# Patient Record
Sex: Female | Born: 1940 | Race: White | Hispanic: No | Smoking: Former smoker
Health system: Southern US, Community
[De-identification: ages and names within clinical notes are randomized; demographics above are authoritative.]

## PROBLEM LIST (undated history)

## (undated) DIAGNOSIS — I509 Heart failure, unspecified: Secondary | ICD-10-CM

## (undated) DIAGNOSIS — N189 Chronic kidney disease, unspecified: Secondary | ICD-10-CM

## (undated) DIAGNOSIS — I480 Paroxysmal atrial fibrillation: Secondary | ICD-10-CM

## (undated) DIAGNOSIS — D649 Anemia, unspecified: Secondary | ICD-10-CM

## (undated) DIAGNOSIS — E119 Type 2 diabetes mellitus without complications: Secondary | ICD-10-CM

## (undated) DIAGNOSIS — J449 Chronic obstructive pulmonary disease, unspecified: Secondary | ICD-10-CM

## (undated) DIAGNOSIS — M1990 Unspecified osteoarthritis, unspecified site: Secondary | ICD-10-CM

## (undated) DIAGNOSIS — R0602 Shortness of breath: Secondary | ICD-10-CM

## (undated) DIAGNOSIS — I10 Essential (primary) hypertension: Secondary | ICD-10-CM

## (undated) HISTORY — PX: VEIN SURGERY: SHX48

---

## 1998-02-22 ENCOUNTER — Ambulatory Visit (HOSPITAL_COMMUNITY): Admission: RE | Admit: 1998-02-22 | Discharge: 1998-02-23 | Payer: Self-pay | Admitting: Ophthalmology

## 1998-02-22 ENCOUNTER — Encounter: Payer: Self-pay | Admitting: Ophthalmology

## 1998-09-06 ENCOUNTER — Ambulatory Visit (HOSPITAL_COMMUNITY): Admission: RE | Admit: 1998-09-06 | Discharge: 1998-09-07 | Payer: Self-pay | Admitting: Ophthalmology

## 1999-01-19 ENCOUNTER — Ambulatory Visit (HOSPITAL_COMMUNITY): Admission: RE | Admit: 1999-01-19 | Discharge: 1999-01-20 | Payer: Self-pay | Admitting: Ophthalmology

## 1999-02-28 ENCOUNTER — Encounter: Payer: Self-pay | Admitting: Ophthalmology

## 1999-02-28 ENCOUNTER — Ambulatory Visit (HOSPITAL_COMMUNITY): Admission: RE | Admit: 1999-02-28 | Discharge: 1999-03-01 | Payer: Self-pay | Admitting: Ophthalmology

## 2006-03-21 ENCOUNTER — Ambulatory Visit: Payer: Self-pay | Admitting: Cardiology

## 2006-06-05 ENCOUNTER — Ambulatory Visit: Payer: Self-pay | Admitting: Cardiology

## 2006-06-28 ENCOUNTER — Ambulatory Visit: Payer: Self-pay | Admitting: Cardiology

## 2006-08-15 ENCOUNTER — Ambulatory Visit: Payer: Self-pay | Admitting: Cardiology
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
## 2010-07-05 NOTE — Assessment & Plan Note (Signed)
[facility]                          [facility] OFF[facility] NOTE   NAME:Haley Cooper, Haley Cooper                         MRN:          [id number]  DATE:08/15/2006                            DOB:          1940/06/26    REFERRING PHYSICIAN:  Dhruv Vyas   HISTORY OF PRESENT ILLNESS:  Patient is a 70 year old female with a  rather complicated course.  She has known severe chronic dyspnea.  She  was recently started on oxygen therapy due to marked desaturation on  exertion.  As documented in my previous note, the patient several months  ago had a very abnormal CT scan, but now more recently had near-complete  resolution of the previously noted diffuse bilateral air space process.  She states that she is feeling much better on oxygen therapy.  She does  remain dyspneic, but this has improved.  Of note, she had laboratory  work during the last office visit that demonstrated a significant anemia  with a hemoglobin of 8.7.  Her creatinine had improved to 1.6 where it  was previously at 2.8.  BNP level was normal at 46.  Despite some lower  extremity edema, she only has moderate pulmonary hypertension.   The patient also has a prior history of abnormal Cardiolite study, but a  cardiac catheterization has never been performed due to her previous  significant renal insufficiency.  The patient also denies any substernal  chest pain.   The patient is under a lot of stress now, as her sister is dying with  colon cancer.  The patient resides at [facility].  She states that she  does do much, just sits most of the time in her chair.  She also does  not drive.   MEDICATIONS:  1. Actos 45 mg a day.  2. Lantus __________.  3. Glucotrol 10 mg p.o. b.i.d.  4. Prinivil 40 daily.  5. Paxil 30 mg p.o. daily.  6. Lasix 40 mg p.o. daily.  7. Prilosec.  8. Aspirin 81 mg a day.  9. Neurontin.  10.Xanax.  11.Combivent.  12.Vytorin.  13.Diovan 80 mg p.o. daily.   PHYSICAL  EXAMINATION:  VITAL SIGNS:  Blood pressure 174/72, heart rate  is 86.  Weight is 281 pounds.  NECK:  Normal carotid upstrokes.  No carotid bruits.  LUNGS:  Diminished breath sounds bilaterally.  HEART:  Regular rate and rhythm.  Normal S1 and S2.  ABDOMEN:  Soft, nontender.  No rebound or guarding.  Good bowel sounds.  EXTREMITIES:  Peripheral pitting edema 2+.   PROBLEM LIST:  1. History of dyspnea.      a.     Multifactorial.  2..  Possible underlying coronary artery disease with abnormal  Cardiolite study with lateral defect.  1. Interstitial lung disease with abnormal CT scan, now resolved.  2. Rule out obstructive sleep apnea and obesity hypoventilation      syndrome.  3. Preserved left ventricular function.  4. Mitral regurgitation.  5. Moderate pulmonary hypertension.  6. Severe obesity.  7. Chronic renal insufficiency, creatinine improved from 2.8 to 1.6.  8.  Chronic anemia.  Hemoglobin now 8.7.   PLAN:  1. The patient'Cooper anemia needs to be further evaluated, and I referred      her to Dr. Woody Seller.  I did draw iron saturation and ferritin.  She      will also have guaiac cards sent to rule out GI bleeding.  I have      given her a prescription for iron sulfate 325 mg p.o. t.i.d.  2. I will have the patient come back in the next couple of weeks after      her anemia has been addressed, given her previous abnormal      Cardiolite and ongoing dyspnea, and now that her creatinine has      improved, I feel we can possibly consider her for a cardiac      catheterization.  3. Patient will follow up in 2-3 weeks.  I will make a final decision      regarding her catheterization.     Ernestine Mcmurray, MD,FACC  Electronically Signed    GED/MedQ  DD: 08/15/2006  DT: 08/15/2006  Job #: [id number]   cc:   Jerene Bears

## 2010-07-08 NOTE — Op Note (Signed)
[place]  Patient:    Haley Cooper                          MRN: [id number] Proc. Date: 01/19/99 Adm. Date:  [id number] Attending:  Nolon Bussing                           Operative Report  PREOPERATIVE DIAGNOSES: 1. 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 2. Nonclearing vitreous hemorrhage of the right eye. 3. History of proliferative diabetic retinopathy with progression of disease despite previous panphotocoagulation and previous vitrectomy of the right eye with recurrent vitreous hemorrhages, now clogging the anterior chamber trabecular meshwork.  POSTOPERATIVE DIAGNOSES: 1. 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 2. Nonclearing vitreous hemorrhage of the right eye. 3. History of proliferative diabetic retinopathy with progression of disease despite previous panphotocoagulation and previous vitrectomy of the right eye with recurrent vitreous hemorrhages, now clogging the anterior chamber trabecular meshwork.  PROCEDURES: 1. Posterior vitrectomy with Endolaser panphotocoagulation of the right eye. 2. Insertion of glaucoma Seton Baerveldt style implant in the superotemporal quadrant of the right eye. 3. Tutoplasty coverage of the footplate placed into the pars plana.  SURGEON:  Nolon Bussing, M.D.  ANESTHESIA:  General endotracheal anesthesia.  INDICATIONS:   Patient is a 70 year old woman with sight-threatening glaucoma on the basis of open-angle disease with dense recurrent vitreous hemorrhages clogging the trabecular meshwork.  She was seen yesterday and found to have a pressure in the 50s.  ______ anterior chamber paracentesis have been fashioned to drain the  anterior chamber as well to allow time for planning of appropriate surgical intervention to consist of vitrectomy with glaucoma Seton implantation into the  pars plana.  Patient understood the risks of anesthesia, including the rare  occurrence of death, loss of the eye, including hemorrhage, infection, scarring, need for further surgery, no change in vision, progression of disease despite intervention, loss of vision and even loss of the eye, including as well death from anesthesia.  She gives her consent for anesthesia, as well as, surgical repair.  Appropriate signed consent was obtained.  DESCRIPTION OF PROCEDURE:  Patient taken to the operating room.  In the operating room,  general endotracheal anesthesia was instituted without difficulty. Right periocular region sterilely prepped and draped in usual ophthalmic fashion. Lid speculum applied.  Conjunctival peritomies fashioned temporally and superiorly.  Superotemporal quadrant was then entered with Gerilyn Nestle scissors.  A 4 mm infusion was secured in 3.5 mm posterior to the limbus in the inferotemporal quadrant. Placement in the vitreous cavity verified visually.   Superior sclerotomies then fashioned.  Wild microscope placed in position with Biom attachment.  Core vitrectomy has not been necessary, had been previously done effectively with excellent vitreous skirt trimming.   A slurry of old vitreous hemorrhage was removed without difficulty mobilized from the anterior chamber, as well as, the  posterior chamber.  Endolaser photocoagulation was then carried out in the posterior segment close to the temporal arcades to further enhance previous laser photocoagulation applied.  Scleral depression is used and there found to be no effected areas for further laser photocoagulation was required.  No neovascularization was identified.  No anterior hyaloidal or fibrovascular proliferation was noted.  To prevent this recurrence particularly this type of glaucoma and because of ______ neovascular glaucoma and dense recurrent vitreous hemorrhage, decision was made to place the glaucoma  Seton implant into the pars plana.  A Baerveldt model [id number] was placed in  the superotemporal quadrant, secured with two 9-0 nylon  sutures on the footplate after insertion of a Seton into the pars plana 3.5 mm posterior to the limbus, ______ temporal quadrant.  The implant itself was also  secured to the sclera appropriate distance posteriorly and under the wings of the lateral rectus and superior rectus muscles.  At this time, tutoplast pericardium was then used overlying the footplate and secured with interrupted 9-0 nylon sutures to effectively cover the footplate.  Thereafter, the conjunctiva and Tenons were then brought forward after the superior sclerotomies had been closed with 7-0 Vicryl suture.  The infusion removed and similarly closed with 7-0 Vicryl suture.  Conjunctiva closed with combined interrupted to running fashion 7-0 Vicryl suture to provide a watertight seal.  Anterior chamber paracentesis was fashioned with a MVR blade.  BSS was irrigated across the anterior chamber and free flow into subconjunctival space ______ was  noted through the implant.  Patient tolerated the procedure well without complication after awakened from anesthesia.  A subconjunctival injection of ______ had been applied.  Sterile patch, Fox shield then gently applied on right eye.  Patient awakened from anesthesia and taken to recovery room in good stable condition after tolerating the procedure without complication. DD:  01/19/99 TD:  01/20/99 Job: [id number]

## 2010-07-08 NOTE — Assessment & Plan Note (Signed)
[facility]                          [facility] OFFICE NOTE   NAME:Haley Cooper, Haley Cooper                         MRN:          [id number]  DATE:06/05/2006                            DOB:          1940-08-05    PRIMARY CARDIOLOGIST:  Dr. Dannielle Burn.   REASON FOR VISIT:  Post hospital followup.   Haley Cooper is a 70 year old female, with no prior cardiac history, who  was referred to [place] in late January here at [facility] for  evaluation for multi factorial dyspnea and abnormal cardiac markers. She  was felt to have bilateral pneumonia, superimposed on COPD exacerbation,  and required hospitalization for 1 week. Her cardiac workup consisted of  a 2D echo which showed continued preserved left ventricular function,  which had been noted in 2005, as well as moderate pulmonary hypertension  (previously felt to be moderately severe). There was also mild mitral  regurgitation and mild diastolic dysfunction.   Patient did bump her cardiac markers with the peak troponin of 1.74. It  was felt that this was most likely secondary to supply/demand mismatch  and patient had multiple cor-morbidities, including chronic anemia with  a hemoglobin of 8.3, and acute on chronic renal insufficiency (peak  creatinine 2.8).   Following stabilization of her clinical status, she was cleared to  proceed with an in-house adenosine Cardiolite which demonstrated a large  lateral/posterolateral defect with partial reversibility; ejection  fraction 51%.   Patient reports some improvement in breathing since her hospitalization,  but continues to have exertional dyspnea, which she had prior to her  hospitalization. As stated before, she continues to deny any development  of exertional chest pain. Moreover, she also denies any orthopnea, or  PND, but does have worsening lower extremity edema which is chronic. Her  weight today is 7 pounds greater than when seen in the  hospital.   Electrocardiogram today reveals normal sinus rhythm at 87 BPM with  normal axis and no ischemic changes.   CURRENT MEDICATIONS:  1. Lasix 40 daily.  2. K-Dur 10 q.o.d.  3. Aspirin 81 daily.  4. Actos 45 daily.  5. Lantus 45 units b.i.d.  6. Glucotrol 10 daily.  7. Prinivil 40 daily.  8. Paxil 30 daily.  9. Prilosec OTC.  10.Neurontin 300 t.i.d.  11.Xanax 0.5 b.i.d.  12.Combivent 2 puffs b.i.d.   PHYSICAL EXAMINATION:  Blood pressure 160/74, pulse 88, regular, weight  284.6.  GENERAL: 70 year old female, obese, sitting upright in no distress.  HEENT: Normocephalic, atraumatic.  NECK: Palpable carotid pulses without bruits.  LUNGS: Markedly diminished breath sounds with faint crackles in the  bases, but no wheezes.  HEART: Markedly diminished heart sounds with no significant murmur.  ABDOMEN: Markedly protuberant, benign.  EXTREMITIES: 3/4 + bilateral pitting edema to the knee.  NEURO: No focal deficit.   IMPRESSION:  1. Volume overload.  2. Persistent exertional dyspnea.      a.     Suspect multi factorial etiology, including possible       significant coronary artery disease.      b.  Recent abnormal adenosine Cardiolite suggestive of lateral       ischemia.  3. Persevered left ventricular function.      a.     Mild mitral regurgitation.  4. Pulmonary hypertension.  5. Morbid obesity.  6. Chronic renal insufficiency.  7. Chronic anemia.   PLAN:  1. Repeat chest CT scan (without contrast) for reevaluation and      exclusion of persistent pneumonia and/or pulmonary edema.  2. Increase diuretic regimen with Lasix 40 b.i.d.  Increase K-Dur to      20 daily.  3. Add a BMP level to lab work drawn earlier today, and check a BMET      in 1 week for close monitoring of electrolytes and renal function.  4. Follow up CBC.  5. A 6 minute walk/ room air ABG.  6. Return clinic follow up with myself and Dr. Dannielle Burn in 2 weeks.      Patient will need stabilization  of her volume status, as well as      review of complete blood work, before clearing her to proceed with      diagnostic coronary angiography. At this point in time, however, it      is not      entirely clear that her exertional dyspnea is strictly related to      an anginal equivalent, given her noted cor-morbidities and      documented pulmonary disease.      Gene Serpe, PA-C  Electronically Signed      Ernestine Mcmurray, MD,FACC  Electronically Signed   GS/MedQ  DD: 06/05/2006  DT: 06/05/2006  Job #: [id number]

## 2010-07-08 NOTE — Op Note (Signed)
[place]  Patient:    Haley Cooper                          MRN: [id number] Proc. Date: 02/28/99 Adm. Date:  [id number] Attending:  Nolon Bussing                           Operative Report  PREOPERATIVE DIAGNOSIS:  Dense vitreous hemorrhage, left eye.  Progressive proliferative diabetic retinopathy, left eye.  PROCEDURE:  Posterior vitrectomy with endolaser panphotocoagulation, left eye.  SURGEON:  Nolon Bussing, M.D.  ANESTHESIA:  Local retrobulbar, monitored anesthesia control, left eye.  INDICATIONS:  The patient is a 70 year old woman with dense vitreous nonclearing vitreous hemorrhage of the left eye.  This is an attempt to induce quiescence of proliferative diabetic retinopathy as well as to remove the current vitreous hemorrhage of the left eye.  Retrolintal hemorrhage is noted.  The patient understands the risks of anesthesia including rare occurrence of death, as well as to the eye including hemorrhage, infection, scarring, need for further surgery, no change in vision, loss of vision, and progression of disease despite intervention.  Appropriate signed consent was obtained.  DESCRIPTION OF PROCEDURE:  The patient was taken to the operating room.  In the  operating room, appropriate monitoring followed by mild sedation.  0.75% Marcaine delivered 5 cc retrobulbar followed by an additional 5 cc laterally in fashion modified Kirk Ruths.  The left periocular region was sterilely prepped and draped in the usual ophthalmic fashion.  The lid speculum applied.  Conjunctiva was then opened temporally and superonasally.  4 mm infusion was secured 3.5 mm posterior to the limbus in inferotemporal quadrant.  Placement in the vitreous cavity identified visually.  Superior sclerotomy was then fashioned.  Wilde microscope placed in position with Biome attachment.  Core vitrectomy was then begun.  Vitreous skirt was then engaged  anteriorly and anterior hyloid was transected and vitreous skirt trimmed 360 degrees using scleral depression techniques.  Vitreous improved, retinal hemorrhage was removed in this fashion.  The retina was nicely attached. Vitreoretinal tusks were noted temporally in the left eye.  Endolaser photocoagulation was then used to treat these areas that could have been the site of recurrent hemorrhages as well as peripherally and then a fill-in pattern. Hemostasis was then spontaneous.  Instruments were removed from the eye.  The superior sclerotomies were then closed.  The infusion was similarly closed. 7-0 Vicryl suture was also used to close the conjunctiva.  Subconjunctival injections of antibiotics and steroids applied.  Sterile patch and Fox shield applied. The patient was awakened from anesthesia and taken to the recovery room in good stable condition after tolerating the procedure without complications. DD:  02/28/99 TD:  02/28/99 Job: [id number]

## 2013-04-20 DIAGNOSIS — N189 Chronic kidney disease, unspecified: Secondary | ICD-10-CM

## 2013-04-20 HISTORY — DX: Chronic kidney disease, unspecified: N18.9

## 2013-04-26 ENCOUNTER — Encounter (HOSPITAL_COMMUNITY): Payer: Self-pay | Admitting: Internal Medicine

## 2013-04-26 ENCOUNTER — Inpatient Hospital Stay (HOSPITAL_COMMUNITY): Payer: Medicare Other

## 2013-04-26 ENCOUNTER — Encounter: Payer: Self-pay | Admitting: Cardiovascular Disease

## 2013-04-26 ENCOUNTER — Inpatient Hospital Stay (HOSPITAL_COMMUNITY)
Admission: AD | Admit: 2013-04-26 | Discharge: 2013-04-30 | DRG: 683 | Disposition: A | Discharge status: Home Health | Payer: Medicare Other | Source: Other Acute Inpatient Hospital | Attending: Internal Medicine | Admitting: Internal Medicine

## 2013-04-26 DIAGNOSIS — I498 Other specified cardiac arrhythmias: Secondary | ICD-10-CM | POA: Diagnosis present

## 2013-04-26 DIAGNOSIS — D649 Anemia, unspecified: Secondary | ICD-10-CM | POA: Diagnosis present

## 2013-04-26 DIAGNOSIS — N186 End stage renal disease: Secondary | ICD-10-CM | POA: Diagnosis present

## 2013-04-26 DIAGNOSIS — I959 Hypotension, unspecified: Secondary | ICD-10-CM

## 2013-04-26 DIAGNOSIS — K219 Gastro-esophageal reflux disease without esophagitis: Secondary | ICD-10-CM | POA: Diagnosis present

## 2013-04-26 DIAGNOSIS — M545 Low back pain, unspecified: Secondary | ICD-10-CM | POA: Diagnosis present

## 2013-04-26 DIAGNOSIS — E662 Morbid (severe) obesity with alveolar hypoventilation: Secondary | ICD-10-CM | POA: Diagnosis present

## 2013-04-26 DIAGNOSIS — I120 Hypertensive chronic kidney disease with stage 5 chronic kidney disease or end stage renal disease: Secondary | ICD-10-CM | POA: Diagnosis present

## 2013-04-26 DIAGNOSIS — Q619 Cystic kidney disease, unspecified: Secondary | ICD-10-CM

## 2013-04-26 DIAGNOSIS — J449 Chronic obstructive pulmonary disease, unspecified: Secondary | ICD-10-CM

## 2013-04-26 DIAGNOSIS — Z87891 Personal history of nicotine dependence: Secondary | ICD-10-CM

## 2013-04-26 DIAGNOSIS — L03119 Cellulitis of unspecified part of limb: Secondary | ICD-10-CM

## 2013-04-26 DIAGNOSIS — L02419 Cutaneous abscess of limb, unspecified: Secondary | ICD-10-CM | POA: Diagnosis present

## 2013-04-26 DIAGNOSIS — N183 Chronic kidney disease, stage 3 unspecified: Secondary | ICD-10-CM | POA: Diagnosis present

## 2013-04-26 DIAGNOSIS — I2510 Atherosclerotic heart disease of native coronary artery without angina pectoris: Secondary | ICD-10-CM | POA: Diagnosis present

## 2013-04-26 DIAGNOSIS — G4733 Obstructive sleep apnea (adult) (pediatric): Secondary | ICD-10-CM

## 2013-04-26 DIAGNOSIS — N179 Acute kidney failure, unspecified: Principal | ICD-10-CM

## 2013-04-26 DIAGNOSIS — E119 Type 2 diabetes mellitus without complications: Secondary | ICD-10-CM

## 2013-04-26 DIAGNOSIS — I872 Venous insufficiency (chronic) (peripheral): Secondary | ICD-10-CM | POA: Diagnosis present

## 2013-04-26 DIAGNOSIS — I509 Heart failure, unspecified: Secondary | ICD-10-CM

## 2013-04-26 DIAGNOSIS — Z9181 History of falling: Secondary | ICD-10-CM

## 2013-04-26 DIAGNOSIS — I5032 Chronic diastolic (congestive) heart failure: Secondary | ICD-10-CM

## 2013-04-26 DIAGNOSIS — N19 Unspecified kidney failure: Secondary | ICD-10-CM

## 2013-04-26 DIAGNOSIS — I4891 Unspecified atrial fibrillation: Secondary | ICD-10-CM

## 2013-04-26 DIAGNOSIS — Z992 Dependence on renal dialysis: Secondary | ICD-10-CM

## 2013-04-26 DIAGNOSIS — I2789 Other specified pulmonary heart diseases: Secondary | ICD-10-CM | POA: Diagnosis present

## 2013-04-26 DIAGNOSIS — Z6841 Body Mass Index (BMI) 40.0 and over, adult: Secondary | ICD-10-CM

## 2013-04-26 DIAGNOSIS — G8929 Other chronic pain: Secondary | ICD-10-CM | POA: Diagnosis present

## 2013-04-26 DIAGNOSIS — E875 Hyperkalemia: Secondary | ICD-10-CM

## 2013-04-26 DIAGNOSIS — E669 Obesity, unspecified: Secondary | ICD-10-CM

## 2013-04-26 DIAGNOSIS — J4489 Other specified chronic obstructive pulmonary disease: Secondary | ICD-10-CM | POA: Diagnosis present

## 2013-04-26 DIAGNOSIS — K439 Ventral hernia without obstruction or gangrene: Secondary | ICD-10-CM | POA: Diagnosis present

## 2013-04-26 HISTORY — DX: Essential (primary) hypertension: I10

## 2013-04-26 HISTORY — DX: Type 2 diabetes mellitus without complications: E11.9

## 2013-04-26 HISTORY — DX: Chronic kidney disease, unspecified: N18.9

## 2013-04-26 HISTORY — DX: Unspecified osteoarthritis, unspecified site: M19.90

## 2013-04-26 HISTORY — DX: Anemia, unspecified: D64.9

## 2013-04-26 HISTORY — DX: Shortness of breath: R06.02

## 2013-04-26 HISTORY — DX: Heart failure, unspecified: I50.9

## 2013-04-26 HISTORY — DX: Chronic obstructive pulmonary disease, unspecified: J44.9

## 2013-04-26 LAB — TYPE AND SCREEN
ABO/RH(D): O NEG
ANTIBODY SCREEN: NEGATIVE

## 2013-04-26 LAB — PROTEIN / CREATININE RATIO, URINE
CREATININE, URINE: 37.5 mg/dL
Protein Creatinine Ratio: 0.18 — ABNORMAL HIGH (ref 0.00–0.15)
Total Protein, Urine: 6.8 mg/dL

## 2013-04-26 LAB — CBC WITH DIFFERENTIAL/PLATELET
Basophils Absolute: 0 10*3/uL (ref 0.0–0.1)
Basophils Relative: 0 % (ref 0–1)
Eosinophils Absolute: 0 10*3/uL (ref 0.0–0.7)
Eosinophils Relative: 1 % (ref 0–5)
HCT: 28.8 % — ABNORMAL LOW (ref 36.0–46.0)
Hemoglobin: 9.3 g/dL — ABNORMAL LOW (ref 12.0–15.0)
Lymphocytes Relative: 8 % — ABNORMAL LOW (ref 12–46)
Lymphs Abs: 0.3 10*3/uL — ABNORMAL LOW (ref 0.7–4.0)
MCH: 31.3 pg (ref 26.0–34.0)
MCHC: 32.3 g/dL (ref 30.0–36.0)
MCV: 97 fL (ref 78.0–100.0)
Monocytes Absolute: 0.1 10*3/uL (ref 0.1–1.0)
Monocytes Relative: 1 % — ABNORMAL LOW (ref 3–12)
Neutro Abs: 4 10*3/uL (ref 1.7–7.7)
Neutrophils Relative %: 91 % — ABNORMAL HIGH (ref 43–77)
Platelets: 234 10*3/uL (ref 150–400)
RBC: 2.97 MIL/uL — ABNORMAL LOW (ref 3.87–5.11)
RDW: 14.7 % (ref 11.5–15.5)
WBC: 4.4 10*3/uL (ref 4.0–10.5)

## 2013-04-26 LAB — PROTIME-INR
INR: 1.03 (ref 0.00–1.49)
Prothrombin Time: 13.3 seconds (ref 11.6–15.2)

## 2013-04-26 LAB — RENAL FUNCTION PANEL
Albumin: 3.5 g/dL (ref 3.5–5.2)
BUN: 84 mg/dL — ABNORMAL HIGH (ref 6–23)
CO2: 19 mEq/L (ref 19–32)
Calcium: 9 mg/dL (ref 8.4–10.5)
Chloride: 100 mEq/L (ref 96–112)
Creatinine, Ser: 3.25 mg/dL — ABNORMAL HIGH (ref 0.50–1.10)
GFR calc Af Amer: 15 mL/min — ABNORMAL LOW (ref >90)
GFR calc non Af Amer: 13 mL/min — ABNORMAL LOW (ref >90)
Glucose, Bld: 138 mg/dL — ABNORMAL HIGH (ref 70–99)
Phosphorus: 4.7 mg/dL — ABNORMAL HIGH (ref 2.3–4.6)
Potassium: 7.6 mEq/L (ref 3.7–5.3)
Sodium: 133 mEq/L — ABNORMAL LOW (ref 137–147)

## 2013-04-26 LAB — APTT: aPTT: 26 seconds (ref 24–37)

## 2013-04-26 LAB — GLUCOSE, CAPILLARY: GLUCOSE-CAPILLARY: 131 mg/dL — AB (ref 70–99)

## 2013-04-26 LAB — TROPONIN I

## 2013-04-26 MED ORDER — AMITRIPTYLINE HCL 10 MG PO TABS
10.0000 mg | ORAL_TABLET | Freq: Every day | ORAL | Status: DC
  Administered 2013-04-26 – 2013-04-29 (×4): 10 mg via ORAL
  Filled 2013-04-26 (×6): qty 1

## 2013-04-26 MED ORDER — INSULIN GLARGINE 100 UNIT/ML ~~LOC~~ SOLN
10.0000 [IU] | Freq: Every day | SUBCUTANEOUS | Status: DC
  Filled 2013-04-26: qty 0.1

## 2013-04-26 MED ORDER — HEPARIN SODIUM (PORCINE) 5000 UNIT/ML IJ SOLN
5000.0000 [IU] | Freq: Three times a day (TID) | INTRAMUSCULAR | Status: DC
  Administered 2013-04-26 – 2013-04-29 (×9): 5000 [IU] via SUBCUTANEOUS
  Filled 2013-04-26 (×11): qty 1

## 2013-04-26 MED ORDER — INSULIN ASPART 100 UNIT/ML ~~LOC~~ SOLN
0.0000 [IU] | Freq: Three times a day (TID) | SUBCUTANEOUS | Status: DC
  Administered 2013-04-26: 1 [IU] via SUBCUTANEOUS
  Administered 2013-04-27 – 2013-04-28 (×3): 2 [IU] via SUBCUTANEOUS
  Administered 2013-04-28: 1 [IU] via SUBCUTANEOUS
  Administered 2013-04-28 – 2013-04-30 (×5): 2 [IU] via SUBCUTANEOUS
  Administered 2013-04-30: 3 [IU] via SUBCUTANEOUS

## 2013-04-26 MED ORDER — GABAPENTIN 300 MG PO CAPS
300.0000 mg | ORAL_CAPSULE | Freq: Three times a day (TID) | ORAL | Status: DC
  Administered 2013-04-26 – 2013-04-30 (×11): 300 mg via ORAL
  Filled 2013-04-26 (×14): qty 1

## 2013-04-26 MED ORDER — PANTOPRAZOLE SODIUM 40 MG PO TBEC
40.0000 mg | DELAYED_RELEASE_TABLET | Freq: Every day | ORAL | Status: DC
  Administered 2013-04-27 – 2013-04-30 (×4): 40 mg via ORAL
  Filled 2013-04-26 (×4): qty 1

## 2013-04-26 MED ORDER — SODIUM POLYSTYRENE SULFONATE 15 GM/60ML PO SUSP
45.0000 g | Freq: Once | ORAL | Status: DC
  Filled 2013-04-26: qty 180

## 2013-04-26 MED ORDER — ACETAMINOPHEN 500 MG PO TABS: 1000.0000 mg | ORAL_TABLET | Freq: Two times a day (BID) | ORAL | Status: DC | PRN

## 2013-04-26 MED ORDER — SIMVASTATIN 40 MG PO TABS
40.0000 mg | ORAL_TABLET | Freq: Every evening | ORAL | Status: DC
  Filled 2013-04-26: qty 1

## 2013-04-26 MED ORDER — CYCLOSPORINE 0.05 % OP EMUL
1.0000 [drp] | Freq: Two times a day (BID) | OPHTHALMIC | Status: DC
  Administered 2013-04-26 – 2013-04-30 (×8): 1 [drp] via OPHTHALMIC
  Filled 2013-04-26 (×10): qty 1

## 2013-04-26 MED ORDER — SILVER SULFADIAZINE 1 % EX CREA
1.0000 "application " | TOPICAL_CREAM | Freq: Every day | CUTANEOUS | Status: DC
  Administered 2013-04-27 – 2013-04-30 (×4): 1 via TOPICAL
  Filled 2013-04-26: qty 85

## 2013-04-26 MED ORDER — OMEPRAZOLE MAGNESIUM 20 MG PO TBEC: 20.0000 mg | DELAYED_RELEASE_TABLET | ORAL | Status: DC

## 2013-04-26 MED ORDER — BENZONATATE 100 MG PO CAPS
200.0000 mg | ORAL_CAPSULE | Freq: Three times a day (TID) | ORAL | Status: DC | PRN
  Filled 2013-04-26: qty 2

## 2013-04-26 NOTE — Consult Note (Addendum)
Haley Cooper 04/26/2013 Pearson Grippe, B Requesting Physician:  Verlon Au  Reason for Consult:  Hyperkalemia, renal failure, EKG changes/bradycardia HPI:  25F presented to [facility] after fall 3d ago at [facility] with weakness found to have wide complex bradycardia by report, K 8.3, BUN 81, creatinine 3.83, Hb 8.2.  Pt with unclear baseline renal function.  At ALF, [facility] lists TMP/SMX DS BID and lisinopril 10 amongst other medications.  Given Cagluc, 40 IV lasix, 10 Units IV insulin / D5, albuterol neg, and 15gm of kayexalate at ED.    ? If TMP/SMX to treat chronic venous stasis with ulcers?  Pt now only c/o is of chronic SOB.  No twitching.  She states taht she often falls out.  No CP.  No abd pain.  No problems passing water.    ROS NSAIDS: meloxicam on MAR, but not recieving IV Contrast none TMP/SMX Yes, BID DS  On MAR since 03/25/13 Hypotension none ACEI: Yes, lisinopril 10 Balance of 12 systems is negative w/ exceptions as above  PMH: COPD, HTN, DM2, CHF, ventral hernia, venous stasis that can be elicited PSH No past surgical history on file. FH no pertinent FH SH past smoker, lives at [place] reports NKDA Home medications Prior to Admission medications   Not on File   [facility] Meds reviewed, includes Insulin Glucotrol Glipizide Lisinopril 10 PPI Fosamax Lasix 40 daily Bactrim DS BID started 03/25/13 neurontin amitryptiline zocor   Current Medications None at [facility]  No labs in [facility] system  Physical Exam  There were no vitals taken for this visit. GEN: NAD, obese, chroncially ill apearing elderly female ENT: edentulous, ncat EYES: EOMI CV: RRR, nl s1s2. No rub PULM: nl WOB, some bibasilar crackles ABD: s/nt/nd.  Large ventral hernia, easily reducible present.  +BS SKIN: no rashes/lesions EXT: chronic venous stasis changes.  2-3+ ledema NEURO: nonfocal. No asterixis  EKG 04/26/13 at 15:43: Rate 40.  Wide complex QRS > 120.  Peaked Ts.  No Ps.     Assessment 25F with renal failure of unclear duration (glucotrol suggests this is AKI) with hyperkalemia (single value 8.3) and wide complex bradycardia with peaked Ts (now in NSR, narrow complex).  Pt on BID DS Bactrim + ACEi  1. Severe Hyperkalemia on TMP/SMX and ACEi 2. Suspected AKI (driven by Bactrim?) 3. Junctional bradycardia with peaked Ts 4. DM2 5. Pt reported CHF 6. Falls 7. COPD by report 69. Anemia  PLAN 1. Hold ACEi and TMP/SMX 2. Stat labs ordered; if persistent hyperkalemia proceed with CVC and iHD tonight 3. [facility] medical mgmt can temporize until meds out of system 4. On Telemetry 5. Renal US 6. UA and UP/C 7. SPEP and sFLC 8. If worsening renal function or obstruction seen, place foley catheter  04/26/13 at 2030: K 7.6.  Given EKG changes prev will procede with 2h HD tonight.  CCM assisting with CVC, appreciate.     Pearson Grippe MD 04/26/2013, 7:48 PM

## 2013-04-26 NOTE — H&P (Signed)
[facility] History and Physical  Haley Cooper [id number] DOB: 06-25-1940 DOA: 04/26/2013  Referring physician: EDP PCP: Glenda Chroman., MD   Chief Complaint: Hyperkalemia   HPI: Haley Cooper is a 73 y.o. female who presented to [facility] hospital after a fall 3 days ago at [facility].  Fall was short duration and she was not down on the floor for a prolonged period of time.  She states she has been becoming progressively weak and ill feeling since that time.  Unclear baseline renal function, she has been on TMP/SMX to treat venous stasis ulcers for the past "2 weeks or so" and is chronically on lisinopril.  Work up in the ED at [facility] showed potassium 8.3, BUN 81, creatinine 3.83, HGB 8.2.  She was transferred to [facility] for further treatment and nephrology consultation.  Review of Systems: Systems reviewed.  As above, otherwise negative  Past Medical History  Diagnosis Date  . DM2 (diabetes mellitus, type 2)   . COPD (chronic obstructive pulmonary disease)   . HTN (hypertension)   . CHF (congestive heart failure)    No past surgical history on file. Social History:  reports that she has quit smoking. She does not have any smokeless tobacco history on file. She reports that she does not drink alcohol or use illicit drugs.  Allergies not on file  No family history on file. No history of ESRD.  Prior to Admission medications   Not on File   Physical Exam: Filed Vitals:   04/26/13 2021  BP: 122/50  Pulse: 80  Temp: 98 F (36.7 C)  Resp: 22    BP 122/50  Pulse 80  Temp(Src) 98 F (36.7 C) (Oral)  Resp 22  SpO2 98%  General Appearance:    Alert, oriented, no distress, Ill appearing appears stated age  Head:    Normocephalic, atraumatic  Eyes:    PERRL, EOMI, sclera non-icteric        Nose:   Nares without drainage or epistaxis. Mucosa, turbinates normal  Throat:   Moist mucous membranes. Oropharynx without erythema or exudate.  Neck:   Supple. No carotid bruits.   No thyromegaly.  No lymphadenopathy.   Back:     No CVA tenderness, no spinal tenderness  Lungs:     Clear to auscultation bilaterally, without wheezes, rhonchi or rales  Chest wall:    No tenderness to palpitation  Heart:    Regular rate and rhythm without murmurs, gallops, rubs  Abdomen:     Soft, non-tender, nondistended, normal bowel sounds, no organomegaly  Genitalia:    deferred  Rectal:    deferred  Extremities:   Chronic venous stasis changes.  Pulses:   2+ and symmetric all extremities  Skin:   Skin color, texture, turgor normal, no rashes or lesions  Lymph nodes:   Cervical, supraclavicular, and axillary nodes normal  Neurologic:   CNII-XII intact. Normal strength, sensation and reflexes      throughout    Labs on Admission:  Basic Metabolic Panel: No results found for this basename: NA, K, CL, CO2, GLUCOSE, BUN, CREATININE, CALCIUM, MG, PHOS,  in the last 168 hours Liver Function Tests: No results found for this basename: AST, ALT, ALKPHOS, BILITOT, PROT, ALBUMIN,  in the last 168 hours No results found for this basename: LIPASE, AMYLASE,  in the last 168 hours No results found for this basename: AMMONIA,  in the last 168 hours CBC: No results found for this basename: WBC, NEUTROABS, HGB, HCT, MCV,  PLT,  in the last 168 hours Cardiac Enzymes: No results found for this basename: CKTOTAL, CKMB, CKMBINDEX, TROPONINI,  in the last 168 hours  BNP (last 3 results) No results found for this basename: PROBNP,  in the last 8760 hours CBG:  Recent Labs Lab 04/26/13 2020  GLUCAP 131*    Radiological Exams on Admission: No results found.  EKG: Independently reviewed. Narrow complex NSR at this time, was wide complex bradycardia and tall peaked T waves back at [facility] before treatment  Assessment/Plan Principal Problem:   AKI (acute kidney injury) Active Problems:   DM2 (diabetes mellitus, type 2)   Hyperkalemia   Chronic CHF   1. Severe hyperkalemia - in setting of  suspected AKI on bactrim and ACEi, severe enough to have been causing EKG changes including junctional bradycardia with peaked T waves at [place].  Holding bactrim and ACEi, strict intake and output, stat labs ordered by nephrology who have already seen patient (see their note in chart) if no improvement in potassium then may require emergent dialysis tonight per their note.  Q4H potassium lab draws ordered (under assumption that there is improvement).  Tele, renal ultrasound, bladder scan ordered for tonight (insert foley if it shows urinary retention).  Nephrology has also ordered work up for [place]. 2. Chronic CHF - just got 40 of lasix at [facility], will review home meds once list is updated. 3. DM2 - have put her on a low dose SSI to start with given that she was hypoglycemic at [place] and required D5, will need to adjust based on home meds and how her BGL responds.    Code Status: Full  Family Communication: No family in room Disposition Plan: Admit to SDU   Time spent: 70 min  GARDNER, JARED M. [facility] Pager [phone]  If 7AM-7PM, please contact the day team taking care of the patient [email or web address] Password [facility] 04/26/2013, 8:57 PM

## 2013-04-26 NOTE — Procedures (Addendum)
Central Venous Catheter Insertion Procedure Note Haley Cooper ON:[id number] 1940-10-22  Procedure: Insertion of Central Venous Catheter Indications: HD  Procedure Details Consent: Risks of procedure as well as the alternatives and risks of each were explained to the (patient/caregiver).  Consent for procedure obtained. Time Out: Verified patient identification, verified procedure, site/side was marked, verified correct patient position, special equipment/implants available, medications/allergies/relevent history reviewed, required imaging and test results available.  Performed  Maximum sterile technique was used including antiseptics, cap, gloves, gown, hand hygiene, mask and sheet. Skin prep: Chlorhexidine; local anesthetic administered A antimicrobial bonded/coated double lumen HD (Trialysis) catheter was placed in the right internal jugular vein using the Seldinger technique.  Evaluation Blood flow good Complications: No apparent complications Patient did tolerate procedure well. Chest X-ray ordered to verify placement.  CXR: pending.  Gidget Quizhpi R. 04/26/2013, 11:22 PM  I used ultrasound to locate and access the vein/artery.

## 2013-04-26 NOTE — Progress Notes (Addendum)
Labs reviewed, K 7.6, I see that Dr. Joelyn Oms has made decision regarding dialysis tonight, agree with Dr. Joelyn Oms especially in light of previous unstable EKG changes.  PCCM putting access in patient and patient going to HD tonight.  Med rec completed, putting patient on Lantus 10 instead of 22 in light of new renal failure, low dose SSI continued.

## 2013-04-27 ENCOUNTER — Other Ambulatory Visit: Payer: Self-pay

## 2013-04-27 ENCOUNTER — Inpatient Hospital Stay (HOSPITAL_COMMUNITY): Payer: Medicare Other

## 2013-04-27 DIAGNOSIS — I4891 Unspecified atrial fibrillation: Secondary | ICD-10-CM | POA: Diagnosis present

## 2013-04-27 DIAGNOSIS — I959 Hypotension, unspecified: Secondary | ICD-10-CM | POA: Diagnosis present

## 2013-04-27 DIAGNOSIS — N19 Unspecified kidney failure: Secondary | ICD-10-CM | POA: Insufficient documentation

## 2013-04-27 LAB — CBC
HCT: 26.6 % — ABNORMAL LOW (ref 36.0–46.0)
HEMOGLOBIN: 8.7 g/dL — AB (ref 12.0–15.0)
MCH: 31.2 pg (ref 26.0–34.0)
MCHC: 32.7 g/dL (ref 30.0–36.0)
MCV: 95.3 fL (ref 78.0–100.0)
Platelets: 249 10*3/uL (ref 150–400)
RBC: 2.79 MIL/uL — ABNORMAL LOW (ref 3.87–5.11)
RDW: 14.5 % (ref 11.5–15.5)
WBC: 5.1 10*3/uL (ref 4.0–10.5)

## 2013-04-27 LAB — POTASSIUM
POTASSIUM: 5 meq/L (ref 3.7–5.3)
POTASSIUM: 5.3 meq/L (ref 3.7–5.3)
Potassium: 7.7 mEq/L (ref 3.7–5.3)
Potassium: 5.2 mEq/L (ref 3.7–5.3)
Potassium: 4.8 mEq/L (ref 3.7–5.3)
Potassium: 5.4 mEq/L — ABNORMAL HIGH (ref 3.7–5.3)
Potassium: 5.3 mEq/L (ref 3.7–5.3)

## 2013-04-27 LAB — MRSA PCR SCREENING: MRSA by PCR: NEGATIVE

## 2013-04-27 LAB — GLUCOSE, CAPILLARY
GLUCOSE-CAPILLARY: 162 mg/dL — AB (ref 70–99)
GLUCOSE-CAPILLARY: 198 mg/dL — AB (ref 70–99)
Glucose-Capillary: 104 mg/dL — ABNORMAL HIGH (ref 70–99)
Glucose-Capillary: 119 mg/dL — ABNORMAL HIGH (ref 70–99)
Glucose-Capillary: 142 mg/dL — ABNORMAL HIGH (ref 70–99)
Glucose-Capillary: 153 mg/dL — ABNORMAL HIGH (ref 70–99)
Glucose-Capillary: 167 mg/dL — ABNORMAL HIGH (ref 70–99)

## 2013-04-27 LAB — RENAL FUNCTION PANEL
Albumin: 3 g/dL — ABNORMAL LOW (ref 3.5–5.2)
BUN: 39 mg/dL — AB (ref 6–23)
CALCIUM: 8.1 mg/dL — AB (ref 8.4–10.5)
CO2: 28 mEq/L (ref 19–32)
CREATININE: 2.02 mg/dL — AB (ref 0.50–1.10)
Chloride: 98 mEq/L (ref 96–112)
GFR calc Af Amer: 27 mL/min — ABNORMAL LOW (ref >90)
GFR calc non Af Amer: 23 mL/min — ABNORMAL LOW (ref >90)
GLUCOSE: 166 mg/dL — AB (ref 70–99)
PHOSPHORUS: 3.1 mg/dL (ref 2.3–4.6)
Potassium: 5.1 mEq/L (ref 3.7–5.3)
SODIUM: 136 meq/L — AB (ref 137–147)

## 2013-04-27 LAB — ABO/RH: ABO/RH(D): O NEG

## 2013-04-27 LAB — HEPATITIS B SURFACE ANTIGEN: HEP B S AG: NEGATIVE

## 2013-04-27 LAB — BASIC METABOLIC PANEL
BUN: 34 mg/dL — AB (ref 6–23)
CALCIUM: 8.3 mg/dL — AB (ref 8.4–10.5)
CO2: 26 meq/L (ref 19–32)
CREATININE: 1.68 mg/dL — AB (ref 0.50–1.10)
Chloride: 99 mEq/L (ref 96–112)
GFR calc Af Amer: 34 mL/min — ABNORMAL LOW (ref >90)
GFR calc non Af Amer: 29 mL/min — ABNORMAL LOW (ref >90)
GLUCOSE: 116 mg/dL — AB (ref 70–99)
Potassium: 4.9 mEq/L (ref 3.7–5.3)
Sodium: 138 mEq/L (ref 137–147)

## 2013-04-27 LAB — HEPATITIS B CORE ANTIBODY, TOTAL: HEP B C TOTAL AB: NONREACTIVE

## 2013-04-27 LAB — TROPONIN I

## 2013-04-27 LAB — HEPATITIS B SURFACE ANTIBODY,QUALITATIVE: HEP B S AB: NEGATIVE

## 2013-04-27 MED ORDER — NEPRO/CARBSTEADY PO LIQD
237.0000 mL | ORAL | Status: DC | PRN
  Filled 2013-04-27: qty 237

## 2013-04-27 MED ORDER — WARFARIN - PHYSICIAN DOSING INPATIENT
Freq: Every day | Status: DC
  Administered 2013-04-27: 17:00:00

## 2013-04-27 MED ORDER — DIGOXIN 0.25 MG/ML IJ SOLN
0.2500 mg | Freq: Once | INTRAMUSCULAR | Status: DC
  Filled 2013-04-27: qty 1

## 2013-04-27 MED ORDER — HEPARIN SODIUM (PORCINE) 1000 UNIT/ML DIALYSIS
1000.0000 [IU] | INTRAMUSCULAR | Status: DC | PRN
Start: 2013-04-27 — End: 2013-04-29
  Administered 2013-04-27: 2400 [IU] via INTRAVENOUS_CENTRAL

## 2013-04-27 MED ORDER — PENTAFLUOROPROP-TETRAFLUOROETH EX AERO
1.0000 | INHALATION_SPRAY | CUTANEOUS | Status: DC | PRN
Start: 2013-04-27 — End: 2013-04-29

## 2013-04-27 MED ORDER — DIGOXIN 0.25 MG/ML IJ SOLN
0.1250 mg | Freq: Every day | INTRAMUSCULAR | Status: DC
  Filled 2013-04-27: qty 0.5

## 2013-04-27 MED ORDER — DIGOXIN 0.25 MG/ML IJ SOLN
0.1250 mg | Freq: Once | INTRAMUSCULAR | Status: AC
  Administered 2013-04-27: 08:00:00 via INTRAVENOUS
  Filled 2013-04-27: qty 0.5

## 2013-04-27 MED ORDER — SODIUM CHLORIDE 0.9 % IV SOLN
100.0000 mL | INTRAVENOUS | Status: DC | PRN
Start: 2013-04-27 — End: 2013-04-29

## 2013-04-27 MED ORDER — LIDOCAINE HCL (PF) 1 % IJ SOLN: 5.0000 mL | INTRAMUSCULAR | Status: DC | PRN

## 2013-04-27 MED ORDER — WARFARIN SODIUM 2 MG PO TABS
2.0000 mg | ORAL_TABLET | Freq: Every day | ORAL | Status: AC
  Administered 2013-04-27: 2 mg via ORAL
  Filled 2013-04-27: qty 1

## 2013-04-27 MED ORDER — SODIUM CHLORIDE 0.9 % IV SOLN: 100.0000 mL | INTRAVENOUS | Status: DC | PRN

## 2013-04-27 MED ORDER — LIDOCAINE-PRILOCAINE 2.5-2.5 % EX CREA
1.0000 | TOPICAL_CREAM | CUTANEOUS | Status: DC | PRN
Start: 2013-04-27 — End: 2013-04-29
  Filled 2013-04-27: qty 5

## 2013-04-27 MED ORDER — DIGOXIN 0.25 MG/ML IJ SOLN
0.2500 mg | Freq: Once | INTRAMUSCULAR | Status: AC
  Administered 2013-04-27: 0.25 mg via INTRAVENOUS
  Filled 2013-04-27: qty 1

## 2013-04-27 MED ORDER — ALTEPLASE 2 MG IJ SOLR
2.0000 mg | Freq: Once | INTRAMUSCULAR | Status: AC | PRN
  Filled 2013-04-27: qty 2

## 2013-04-27 NOTE — Progress Notes (Signed)
Pt getting hemodialysis Tx for high K level, started having frequent runs of SVT with rate as high as 150 followed by rhythm slowing and having frequent multifocal PVC's and coupling and 3beat runs of V-tach. Dr. Joelyn Oms called and informed of this. Repeat k was sent down STAT. Pt to continue hemodialysis unless wide complex ectopy gets more frequent. Pts BP has decreased but is maintining-107/53.

## 2013-04-27 NOTE — Progress Notes (Signed)
Admit: 04/26/2013 LOS: 1  28F presented to outside ED with renal failure of unclear duration (glucotrol suggests this is AKI) with hyperkalemia (K 8.3) and wide complex junctional rhythm with peaked T-waves. Pt on BID DS Bactrim + ACEi  Subjective:  K did not further improve, rec HD yesterday with post K 4.8. Has developed SVT/AFib with RVR during post/treatment; BP s table Pt w/o CP or SOB Loaded on dig UP/C 0.18  03/07 0701 - 03/08 0700 In: 240 [P.O.:240] Out: 1700 [Urine:1700]  Filed Weights   04/26/13 2021 04/27/13 0030  Weight: 109.5 kg (241 lb 6.5 oz) 109.5 kg (241 lb 6.5 oz)    Current meds: reviewed  Current Labs: reviewed    Physical Exam:  Blood pressure 107/59, pulse 149, temperature 97.5 F (36.4 C), temperature source Oral, resp. rate 16, height 5\' 1"  (1.549 m), weight 109.5 kg (241 lb 6.5 oz), SpO2 96.00%. GEN: NAD, obese, chroncially ill apearing elderly female  ENT: edentulous, ncat; NECK: R IJ HD Cath noted EYES: EOMI  CV: RRR, nl s1s2. No rub  PULM: nl WOB, some bibasilar crackles  ABD: s/nt/nd. Large ventral hernia, easily reducible present. +BS  SKIN: no rashes/lesions  EXT: chronic venous stasis changes. 2-3+ ledema  NEURO: nonfocal. No asterixis  Assessment 1. Severe Hyperkalemia with EKG changes s/p HD 04/27/13, resolved driven by TMP/SMX and ACEi, AKI 2. Suspected AKI over CKD 3. AFib with RVR / SVT 4. DM2 5. CHF 6. COPD 7. Anemia  Plan 1. Off ACEi and TMP/SMX 2. Follow BID Renal panel 3. No further HD at this time, follow to asses current GFR 4. Strict I/Os 5. AFib per TRH.  6. Renal US pending 7. SPEP and sFLC pending  Pearson Grippe MD 04/27/2013, 7:49 AM   Recent Labs Lab 04/26/13 2045 04/27/13 0045 04/27/13 0321 04/27/13 0554  NA 133*  --   --   --   K 7.6* 7.7* 5.2 4.8  CL 100  --   --   --   CO2 19  --   --   --   GLUCOSE 138*  --   --   --   BUN 84*  --   --   --   CREATININE 3.25*  --   --   --   CALCIUM 9.0  --   --    --   PHOS 4.7*  --   --   --     Recent Labs Lab 04/26/13 2045 04/27/13 0356  WBC 4.4 5.1  NEUTROABS 4.0  --   HGB 9.3* 8.7*  HCT 28.8* 26.6*  MCV 97.0 95.3  PLT 234 249

## 2013-04-27 NOTE — Consult Note (Signed)
  Reason for Consult:Atrial fibrillation with a RVR  Referring Physician: Dr. Darliss Ridgel Haley Cooper is an 73 y.o. female.   HPI: The patient is a 73 yo woman with a h/o chronic renal insufficiency who presented with syncope in the setting of hyperkalemia and renal failure. She has undergone emergent HD and was found to be in atrial fibrillation with a RVR. She has now reverted back to NSR. She has chronic peripheral edema, morbid obesity, and COPD. She has HTN and DM. She is quite sedentary. She is noted to have falls in the past. She denies a h/o palpitations and apparently did not know that she was in atrial fibrillation.   PMH: Past Medical History  Diagnosis Date  . DM2 (diabetes mellitus, type 2)   . COPD (chronic obstructive pulmonary disease)   . HTN (hypertension)   . CHF (congestive heart failure)     PSHX:No past surgical history on file.  FAMHX:No family history on file.  Social History:  reports that she has quit smoking. She does not have any smokeless tobacco history on file. She reports that she does not drink alcohol or use illicit drugs.  Allergies: No Known Allergies  Medications: reviewed  Dg Chest Port 1 View  04/27/2013   CLINICAL DATA:  HD catheter placement verification.  EXAM: PORTABLE CHEST - 1 VIEW  COMPARISON:  None.  FINDINGS: The heart is enlarged. There is moderate vascular congestion. Hemodialysis catheter has been placed from a right supraclavicular/ internal jugular approach. The tips appear to be at or just below the SVC/RA junction. There is no visible pneumothorax.  IMPRESSION: HD catheter tips project over the region of the cavoatrial junction. There is cardiomegaly with moderate vascular congestion.   Electronically Signed   By: Rolla Flatten M.D.   On: 04/27/2013 00:43    ROS  As stated in the HPI and negative for all other systems. She has chronic dyspnea  Physical Exam  Vitals:Blood pressure 153/65, pulse 79, temperature 97.9 F (36.6 C),  temperature source Oral, resp. rate 16, height 5\' 1"  (1.549 m), weight 241 lb 6.5 oz (109.5 kg), SpO2 93.00%.  Chronically ill appearing obese, diskempt, looks older than her stated age, NAD HEENT: Unremarkable Neck:  No JVD, no thyromegally, indwelling catheter Back:  No CVA tenderness Lungs:  Scattered rales and rhonchi, no increased work of breathing HEART:  Regular rate rhythm, no murmurs, no rubs, no clicks Abd:  obese, positive bowel sounds, no organomegally, no rebound, no guarding Ext:  2 plus pulses, no edema, no cyanosis, no clubbing Skin:  No rashes no nodules Neuro:  CN II through XII intact, motor grossly intact  Tele - atrial fib with an RVR, now NSR CXR - reviewed ECG - reviewed  Assessment/Plan: 1. New onset atrial fib 2. ESRD on HD 3. HTN 4. DM 5. Chronic diastolic heart failure 6. COPD Rec: A very difficult situation. The patient is not a good anti-coagulation candidate and has no real good anti-arrhythmic drug options. Her obesity and copd make amio a relatively poor choice. I am concerned about systemic anti-coagulation in this patient but know that her stroke risk is also very high. I would recommend a trial of coumadin. Not a candidate for any of the novel agents.    Carleene Overlie TaylorMD 04/27/2013, 3:06 PM

## 2013-04-27 NOTE — Progress Notes (Signed)
Dr. Joelyn Oms called results of K of 5.2. Pt had run 40 additional minutes on hemodialysis before the lab results were back. Pt continued to be in SVT with rate from 140-170. BP was starting to drop. Hemodialysis Tx was stopped. Runs of SVT remained frequent after dialysis but did slow down some

## 2013-04-27 NOTE — Progress Notes (Signed)
Note: This document was prepared with digital dictation and possible smart phrase technology. Any transcriptional errors that result from this process are unintentional.   Haley Cooper:[id number] DOB: November 12, 1940 DOA: 04/26/2013 PCP: Glenda Chroman., MD  Brief narrative: 54 yr ? known multifactorial dyspnea, possible underlying CAD with prior abn Cardiolyte [see notes from [facility]], ?OHs/OSA [reports used to be on CPAP ], prior smoker for 27 years +MOd Pulm Art Htn, h/o Chronic renal insuff, Anemia and assisted living facility resident admitted 04/26/13 from outside hospital, [facility] ED with acute renal insufficiency with hyperkalemia-in a setting of 3 week use Bactrim for lower extremity cellulitis prescribed by her podiatrist-also on lisinopril, Lasix. She states that she had multiple falls over the past week, one when she was at the supermarket and another fall that was unprovoked a day to day and a half ago and was urged by her assisted living staff to get medical attention. She denies any dizziness weakness or presyncopal episodes causing this but does carry a history of chronic low back pain for the past one and half to 2 years which has been nonsurgically managed-she takes meloxicam  Admission labs = BUN 84, creatinine 3.2, potassium 7.6 EGFR 13-empirically treated calcium gluconate, Kayexalate, 40 IV Lasix as well as insulin D5 Hemoglobin 9.3 platelets 234 white count 4.4 Chest x-ray = cardiomegaly with moderate vascular congestion Nephrology, critical care consultant given emergent need for dialysis-right-sided central venous line placed 3/7   Past medical history-As per Problem list Chart reviewed as below- Reviewed  Consultants:  Nephrology  Cardiology  Procedures:  Central venous line right neck  Antibiotics:  None   Subjective  Looks uncomfortable, laying in bed, denies chest pain nausea vomiting shortness of breath GERD vision double vision. States that she has never  had chest pain before or palpitations denies weakness on any one side    Objective    Interim History: Junctional rhythm changed to atrial fibrillation, rate 95 to 130  Telemetry: See above    Objective: Filed Vitals:   04/27/13 0400 04/27/13 0407 04/27/13 0415 04/27/13 0500  BP: 92/63  95/60 107/59  Pulse: 149 164 155 149  Temp:      TempSrc:  Oral    Resp: '10 12 11 16  ' Height:      Weight:      SpO2: 98% 97% 97% 96%    Intake/Output Summary (Last 24 hours) at 04/27/13 0720 Last data filed at 04/27/13 0407  Gross per 24 hour  Intake    240 ml  Output   1700 ml  Net  -1460 ml    Exam:  General: Pleasant, morbidly obese, no icterus, no pallor, thick neck, Mallampati 4,  Cardiovascular: S1-S2 tachycardic  Respiratory: Clinically clear no added sound  Abdomen: Soft nontender nondistended  Skin   Neurogrossly intact moving all 4 limbs equally  Data Reviewed: Basic Metabolic Panel:  Recent Labs Lab 04/26/13 2045  04/27/13 0321 04/27/13 0554  NA 133*  --   --   --   K 7.6*  < > 5.2 4.8  CL 100  --   --   --   CO2 19  --   --   --   GLUCOSE 138*  --   --   --   BUN 84*  --   --   --   CREATININE 3.25*  --   --   --   CALCIUM 9.0  --   --   --  PHOS 4.7*  --   --   --   < > = values in this interval not displayed. Liver Function Tests:  Recent Labs Lab 04/26/13 2045  ALBUMIN 3.5   No results found for this basename: LIPASE, AMYLASE,  in the last 168 hours No results found for this basename: AMMONIA,  in the last 168 hours CBC:  Recent Labs Lab 04/26/13 2045 04/27/13 0356  WBC 4.4 5.1  NEUTROABS 4.0  --   HGB 9.3* 8.7*  HCT 28.8* 26.6*  MCV 97.0 95.3  PLT 234 249   Cardiac Enzymes:  Recent Labs Lab 04/26/13 2055 04/27/13 0319  TROPONINI <0.30 <0.30   BNP: No components found with this basename: POCBNP,  CBG:  Recent Labs Lab 04/26/13 2020 04/27/13 04/27/13 0431  GLUCAP 131* 167* 119*    Recent Results (from the past 240  hour(s))  MRSA PCR SCREENING     Status: None   Collection Time    04/26/13  9:54 PM      Result Value Ref Range Status   MRSA by PCR NEGATIVE  NEGATIVE Final   Comment:            The GeneXpert MRSA Assay (FDA     approved for NASAL specimens     only), is one component of a     comprehensive MRSA colonization     surveillance program. It is not     intended to diagnose MRSA     infection nor to guide or     monitor treatment for     MRSA infections.     Studies:              All Imaging reviewed and is as per above notation   Scheduled Meds: . amitriptyline  10 mg Oral QHS  . cycloSPORINE  1 drop Both Eyes BID  . [START ON 04/28/2013] digoxin  0.125 mg Intravenous Daily  . digoxin  0.25 mg Intravenous Once  . gabapentin  300 mg Oral TID  . heparin  5,000 Units Subcutaneous 3 times per day  . insulin aspart  0-9 Units Subcutaneous TID WC  . insulin glargine  10 Units Subcutaneous Daily  . pantoprazole  40 mg Oral Daily  . silver sulfADIAZINE  1 application Topical Daily  . simvastatin  40 mg Oral QPM   Continuous Infusions:    Assessment/Plan: 1. Acute kidney injury with uremia, hyperkalemia necessitating emergent dialysis-precipitants include ACE inhibitor, Bactrim use, silver sulfadiazine-defer to nephrology further plans of care. She is putting out good urine and hopefully ultrafiltration has helped and she may not need further dialysis 2. New onset atrial fibrillation, CHad2Vasc2 score=4-necessitates anticoagulation eventually. Loaded wh digoxin 0.25 IV at 6:30 this morning. Repeated 0.125 dose at 8 AM-caution as this is probably renally cleared but no other option at this time and defer decision-making about amiodarone 2 Cardiology,Consuted to help assist with further management. Hopefully can rate control and may convert on her own. Cannot use rate controlling agent such as CCB, beta blocker secondary to moderate hypotension-since this is new onset, potentially can  cardiovert? 3. Chronic diastolic heart failure-update echocardiogram STAT 4. 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  Will consider addition hydralazine, nitrate.  Eventually will need pulmonary input-can trial CPAP at night if patient amenable  5. Lower extremity cellulitis-discontinue all antibiotics at present time 6. Diabetes mellitus type 2-continue sliding scale insulin only for now.  Hold off on gabapentin given uremic symptoms and hyperkalemia  7. Morbid obesity, Body mass index is 45.64 kg/(m^2). 8.  low back pain-outpatient followup. Cox 2 inhibitor held 9. Reflux-pantoprazole 40 daily   Code Status: Full  Family Communication: None at bedside  Disposition Plan: Step down    Verneita Griffes, MD  [facility] Pager [phone] 04/27/2013, 7:20 AM    LOS: 1 day

## 2013-04-28 ENCOUNTER — Inpatient Hospital Stay (HOSPITAL_COMMUNITY): Payer: Medicare Other

## 2013-04-28 DIAGNOSIS — E669 Obesity, unspecified: Secondary | ICD-10-CM | POA: Diagnosis present

## 2013-04-28 DIAGNOSIS — I369 Nonrheumatic tricuspid valve disorder, unspecified: Secondary | ICD-10-CM

## 2013-04-28 DIAGNOSIS — N183 Chronic kidney disease, stage 3 unspecified: Secondary | ICD-10-CM | POA: Diagnosis present

## 2013-04-28 DIAGNOSIS — I5032 Chronic diastolic (congestive) heart failure: Secondary | ICD-10-CM

## 2013-04-28 DIAGNOSIS — G4733 Obstructive sleep apnea (adult) (pediatric): Secondary | ICD-10-CM | POA: Diagnosis present

## 2013-04-28 DIAGNOSIS — J449 Chronic obstructive pulmonary disease, unspecified: Secondary | ICD-10-CM | POA: Diagnosis present

## 2013-04-28 LAB — HEMOGLOBIN A1C
Hgb A1c MFr Bld: 6.1 % — ABNORMAL HIGH (ref <5.7)
Mean Plasma Glucose: 128 mg/dL — ABNORMAL HIGH (ref <117)

## 2013-04-28 LAB — PROTIME-INR
INR: 1.01 (ref 0.00–1.49)
PROTHROMBIN TIME: 13.1 s (ref 11.6–15.2)

## 2013-04-28 LAB — RENAL FUNCTION PANEL
ALBUMIN: 2.9 g/dL — AB (ref 3.5–5.2)
BUN: 40 mg/dL — AB (ref 6–23)
CO2: 28 meq/L (ref 19–32)
Calcium: 7.9 mg/dL — ABNORMAL LOW (ref 8.4–10.5)
Chloride: 101 mEq/L (ref 96–112)
Creatinine, Ser: 1.95 mg/dL — ABNORMAL HIGH (ref 0.50–1.10)
GFR calc Af Amer: 28 mL/min — ABNORMAL LOW (ref >90)
GFR calc non Af Amer: 24 mL/min — ABNORMAL LOW (ref >90)
Glucose, Bld: 199 mg/dL — ABNORMAL HIGH (ref 70–99)
POTASSIUM: 5.2 meq/L (ref 3.7–5.3)
Phosphorus: 3.1 mg/dL (ref 2.3–4.6)
Sodium: 136 mEq/L — ABNORMAL LOW (ref 137–147)

## 2013-04-28 LAB — GLUCOSE, CAPILLARY
GLUCOSE-CAPILLARY: 135 mg/dL — AB (ref 70–99)
GLUCOSE-CAPILLARY: 161 mg/dL — AB (ref 70–99)
GLUCOSE-CAPILLARY: 163 mg/dL — AB (ref 70–99)
Glucose-Capillary: 119 mg/dL — ABNORMAL HIGH (ref 70–99)
Glucose-Capillary: 213 mg/dL — ABNORMAL HIGH (ref 70–99)

## 2013-04-28 LAB — CBC
HCT: 25.1 % — ABNORMAL LOW (ref 36.0–46.0)
Hemoglobin: 8 g/dL — ABNORMAL LOW (ref 12.0–15.0)
MCH: 31.1 pg (ref 26.0–34.0)
MCHC: 31.9 g/dL (ref 30.0–36.0)
MCV: 97.7 fL (ref 78.0–100.0)
Platelets: 232 10*3/uL (ref 150–400)
RBC: 2.57 MIL/uL — ABNORMAL LOW (ref 3.87–5.11)
RDW: 14.7 % (ref 11.5–15.5)
WBC: 5.7 10*3/uL (ref 4.0–10.5)

## 2013-04-28 LAB — KAPPA/LAMBDA LIGHT CHAINS
Kappa free light chain: 7.49 mg/dL — ABNORMAL HIGH (ref 0.33–1.94)
Kappa, lambda light chain ratio: 1.5 (ref 0.26–1.65)
Lambda free light chains: 4.99 mg/dL — ABNORMAL HIGH (ref 0.57–2.63)

## 2013-04-28 MED ORDER — DILTIAZEM HCL ER COATED BEADS 120 MG PO TB24
120.0000 mg | ORAL_TABLET | Freq: Every day | ORAL | Status: DC
Start: 2013-04-29 — End: 2013-04-28

## 2013-04-28 MED ORDER — DILTIAZEM HCL ER COATED BEADS 120 MG PO CP24
120.0000 mg | ORAL_CAPSULE | Freq: Every day | ORAL | Status: DC
  Administered 2013-04-29 – 2013-04-30 (×2): 120 mg via ORAL
  Filled 2013-04-28 (×2): qty 1

## 2013-04-28 MED ORDER — DIPHENOXYLATE-ATROPINE 2.5-0.025 MG PO TABS
1.0000 | ORAL_TABLET | Freq: Once | ORAL | Status: AC
  Administered 2013-04-28: 1 via ORAL
  Filled 2013-04-28: qty 1

## 2013-04-28 MED ORDER — INSULIN GLARGINE 100 UNIT/ML ~~LOC~~ SOLN
5.0000 [IU] | Freq: Every day | SUBCUTANEOUS | Status: DC
  Administered 2013-04-28 – 2013-04-30 (×3): 5 [IU] via SUBCUTANEOUS
  Filled 2013-04-28 (×3): qty 0.05

## 2013-04-28 MED ORDER — ASPIRIN 81 MG PO CHEW
81.0000 mg | CHEWABLE_TABLET | Freq: Every day | ORAL | Status: DC
  Administered 2013-04-28 – 2013-04-29 (×2): 81 mg via ORAL
  Filled 2013-04-28 (×2): qty 1

## 2013-04-28 MED ORDER — DIGOXIN 125 MCG PO TABS
0.1250 mg | ORAL_TABLET | Freq: Every day | ORAL | Status: DC
  Administered 2013-04-28: 0.125 mg via ORAL
  Filled 2013-04-28: qty 1

## 2013-04-28 NOTE — Progress Notes (Signed)
Pt. Seen and examined. Agree with the NP/PA-C note as written.  Would recommend cardizem for long-term a-fib rate control. Discontinue digoxin (bp has improved), relatively contraindicated with recent renal failure. Agree that potential stroke risk is high - a trial of warfarin is reasonable. Echo today.  Pixie Casino, MD, [facility] Attending Cardiologist [place]

## 2013-04-28 NOTE — Progress Notes (Signed)
    Subjective:  Having dialysis cather pulled, she is not SOB flat in bed.  Objective:  Vital Signs in the last 24 hours: Temp:  [98.1 F (36.7 C)-98.6 F (37 C)] 98.1 F (36.7 C) (03/09 0810) Pulse Rate:  [56-88] 78 (03/09 1013) Resp:  [11-23] 16 (03/09 1000) BP: (79-160)/(36-65) 160/58 mmHg (03/09 0900) SpO2:  [91 %-100 %] 93 % (03/09 1000) Weight:  [241 lb 6.5 oz (109.5 kg)] 241 lb 6.5 oz (109.5 kg) (03/09 0407)  Intake/Output from previous day:  Intake/Output Summary (Last 24 hours) at 04/28/13 1101 Last data filed at 04/28/13 0900  Gross per 24 hour  Intake    740 ml  Output    825 ml  Net    -85 ml    Physical Exam: General appearance: alert, cooperative, morbidly obese, pale and chronically ill appearing Lungs: clear to auscultation bilaterally Heart: regular rate and rhythm   Rate: 80  Rhythm: normal sinus rhythm  Lab Results:  Recent Labs  04/27/13 0356 04/28/13 0240  WBC 5.1 5.7  HGB 8.7* 8.0*  PLT 249 232    Recent Labs  04/27/13 1635  04/27/13 2107 04/28/13 0240  NA 136*  --   --  136*  K 5.1  < > 5.3 5.2  CL 98  --   --  101  CO2 28  --   --  28  GLUCOSE 166*  --   --  199*  BUN 39*  --   --  40*  CREATININE 2.02*  --   --  1.95*  < > = values in this interval not displayed.  Recent Labs  04/27/13 0319 04/27/13 0830  TROPONINI <0.30 <0.30    Recent Labs  04/28/13 1004  INR 1.01    Imaging: Imaging results have been reviewed  Cardiac Studies:  Assessment/Plan:   Principal Problem:   AKI (acute kidney injury) Active Problems:   New onset a-fib   DM2 (diabetes mellitus, type 2)   Renal failure- temporary dialysis   Hyperkalemia   Hypotension, unspecified   COPD (chronic obstructive pulmonary disease)   Obesity- BMI 45    PLAN: Check echo now that she is in NSR. Beta blocker or Diltiazem probably better for PAF long term, currently on Lanoxin, will review with MD. See Dr Tanna Furry note about anticoagulation. Add ASA  81 mg. Check TSH.  Kerin Ransom PA-C Beeper [id number] 04/28/2013, 11:01 AM

## 2013-04-28 NOTE — Progress Notes (Signed)
Called 2 west to attempt to give report to nurse but nurse was busy with a patient and will call 2 central nurse back. Pt has orders to go to 2 west 13. Pt  Is aware. Pt just finished eating. Pt requested to be cleaned up prior to going to floor. Pt is attempting a partial bath now. Nurse assistant is helping pt.

## 2013-04-28 NOTE — Progress Notes (Signed)
S:Feels better.  No new CO O:BP 116/36  Pulse 56  Temp(Src) 98.3 F (36.8 C) (Oral)  Resp 12  Ht 5\' 1"  (1.549 m)  Wt 109.5 kg (241 lb 6.5 oz)  BMI 45.64 kg/m2  SpO2 100%  Intake/Output Summary (Last 24 hours) at 04/28/13 0716 Last data filed at 04/28/13 0550  Gross per 24 hour  Intake    660 ml  Output   1475 ml  Net   -815 ml   Weight change: 0 kg (0 lb) [id number] and alert CVS:RRR Resp:clear Abd:+ BS NTND [id number] edema NEURO:Ox3 no asterixis   . amitriptyline  10 mg Oral QHS  . cycloSPORINE  1 drop Both Eyes BID  . digoxin  0.125 mg Intravenous Daily  . digoxin  0.25 mg Intravenous Once  . gabapentin  300 mg Oral TID  . heparin  5,000 Units Subcutaneous 3 times per day  . insulin aspart  0-9 Units Subcutaneous TID WC  . pantoprazole  40 mg Oral Daily  . silver sulfADIAZINE  1 application Topical Daily  . Warfarin - Physician Dosing Inpatient   Does not apply q1800   Dg Chest Port 1 View  04/27/2013   CLINICAL DATA:  HD catheter placement verification.  EXAM: PORTABLE CHEST - 1 VIEW  COMPARISON:  None.  FINDINGS: The heart is enlarged. There is moderate vascular congestion. Hemodialysis catheter has been placed from a right supraclavicular/ internal jugular approach. The tips appear to be at or just below the SVC/RA junction. There is no visible pneumothorax.  IMPRESSION: HD catheter tips project over the region of the cavoatrial junction. There is cardiomegaly with moderate vascular congestion.   Electronically Signed   By: Rolla Flatten M.D.   On: 04/27/2013 00:43   BMET    Component Value Date/Time   NA 136* 04/28/2013 0240   K 5.2 04/28/2013 0240   CL 101 04/28/2013 0240   CO2 28 04/28/2013 0240   GLUCOSE 199* 04/28/2013 0240   BUN 40* 04/28/2013 0240   CREATININE 1.95* 04/28/2013 0240   CALCIUM 7.9* 04/28/2013 0240   GFRNONAA 24* 04/28/2013 0240   GFRAA 28* 04/28/2013 0240   CBC    Component Value Date/Time   WBC 5.7 04/28/2013 0240   RBC 2.57* 04/28/2013 0240   HGB 8.0*  04/28/2013 0240   HCT 25.1* 04/28/2013 0240   PLT 232 04/28/2013 0240   MCV 97.7 04/28/2013 0240   MCH 31.1 04/28/2013 0240   MCHC 31.9 04/28/2013 0240   RDW 14.7 04/28/2013 0240   LYMPHSABS 0.3* 04/26/2013 2045   MONOABS 0.1 04/26/2013 2045   EOSABS 0.0 04/26/2013 2045   BASOSABS 0.0 04/26/2013 2045     Assessment: 1. AFR, ? If CKD. Scr trending down 2. Hyperkalemia, resolved 3. A Fib 4. DM 5. Anemia  Plan: 1.  Renal fx cont to improve with good UO.  UA neg from [facility].  She denies any hx of CKD? 2. Dc foley 3. DC HD catheter 4. Daily Scr 5. Renal US scheduled for today.   Haley Cooper

## 2013-04-28 NOTE — Progress Notes (Signed)
[facility]  Haley Cooper [id number] DOB: [birth year] DOA: 04/26/2013 PCP: Glenda Chroman., MD  Brief narrative: 71 yr ? known multifactorial dyspnea, possible underlying CAD with prior abn Cardiolyte [see notes from [facility]], ?OHS/OSA [reported used to be on CPAP ], prior smoker for 27 years +Mod Pulm Art Htn, h/o Chronic renal insuff, Anemia and assisted living facility resident admitted 04/26/13 from outside hospital, [facility] ED with acute renal insufficiency with hyperkalemia-in a setting of 3 week use Bactrim for lower extremity cellulitis prescribed by her podiatrist. Also on lisinopril and Lasix prior to admit. She stated that she had multiple falls over the past week, one when she was at the [employer] and another fall that was unprovoked a day to day and a half ago and was urged by her assisted living staff to get medical attention.  Admission labs were significantly abnormal with a BUN 84, creatinine 3.2, potassium 7.6 EGFR 13-empirically treated calcium gluconate, Kayexalate, 40 IV Lasix as well as insulin D5     Assessment/Plan: Active Problems:   Hyperkalemia/Acute renal failure -resolving -per Nephro -suspect caused by Bactrim in setting of presumed CKD in setting of ACE I and Lasix -renal stopping HD and removing temp HD cath -great UOP    CKD (chronic kidney disease), stage III -baseline Scr unknown but suspected to have underlying CKD    DM2 (diabetes mellitus, type 2) -on Glucotrol, Lantus and Lispro for meal coverage at home -hold OHAs until renal function stable due to clearance issues -tl diet so can begin low dose Lantus and cont SSI-CBGs around 198-135 -check Hgb A1c    Chronic CHF -ECHO pending to clarify -? History of CAD    New onset a-fib -now maintaining NSR -per Cards -on digoxin but since PAF Cards rec CCB or BB instead long term -ASA 81 mg added by Cards -TSH pending    Diarrhea -r/o c diff and viral  etiologies - 1x lomotil until sure not infectious    Hypotension -resolved and likely due to RVR    COPD (chronic obstructive pulmonary disease) -compensated without wheeze     Obesity- BMI 45/Sleep apnea, obstructive -used to wear CPAP- follow while IP   DVT prophylaxis: Subcutaneous heparin Code Status: Full code Family Communication: No family at bedside Disposition Plan/Expected LOS: Transfer to telemetry   Consultants: Nephrology Cardiology  Procedures: 2-D echocardiogram pending  Insertion central venous hemodialysis catheter  Antibiotics: None  HPI/Subjective: Patient alert and seated on the side of bed. Endorses feeling much better. States no weakness or dizziness when up moving with assistance. No chest pain or shortness of breath endorsed. No orthopnea endorsed.  Objective: Blood pressure 160/58, pulse 78, temperature 98.1 F (36.7 C), temperature source Oral, resp. rate 16, height _0  (1.549 m), weight 241 lb 6.5 oz (109.5 kg), SpO2 93.00%.  Intake/Output Summary (Last 24 hours) at 04/28/13 1104 Last data filed at 04/28/13 0900  Gross per 24 hour  Intake    740 ml  Output    825 ml  Net    -85 ml     Exam: General: No acute respiratory distress Lungs: Clear but coarse to auscultation bilaterally without wheezes or crackles, nasal cannula oxygen Cardiovascular: Regular rate and rhythm without murmur gallop or rub normal S1 and S2, 2+ peripheral bilateral lower extremity edema without JVD Abdomen: Nontender, nondistended, soft, bowel sounds positive, no rebound, no ascites, no appreciable mass Musculoskeletal: No significant cyanosis, clubbing of bilateral lower  extremities Neurological: Alert and oriented x 3, moves all extremities x 4 without focal neurological deficits, CN 2-12 intact  Scheduled Meds:  Scheduled Meds: . amitriptyline  10 mg Oral QHS  . cycloSPORINE  1 drop Both Eyes BID  . digoxin  0.25 mg Intravenous Once  . digoxin  0.125 mg  Oral Daily  . gabapentin  300 mg Oral TID  . heparin  5,000 Units Subcutaneous 3 times per day  . insulin aspart  0-9 Units Subcutaneous TID WC  . pantoprazole  40 mg Oral Daily  . silver sulfADIAZINE  1 application Topical Daily  . Warfarin - Physician Dosing Inpatient   Does not apply q1800   Continuous Infusions:   Data Reviewed: Basic Metabolic Panel:  Recent Labs Lab 04/26/13 2045  04/27/13 0554  04/27/13 1311 04/27/13 1635 04/27/13 1700 04/27/13 2107 04/28/13 0240  NA 133*  --  138  --   --  136*  --   --  136*  K 7.6*  < > 4.8  4.9  < > 5.4* 5.1 5.3 5.3 5.2  CL 100  --  99  --   --  98  --   --  101  CO2 19  --  26  --   --  28  --   --  28  GLUCOSE 138*  --  116*  --   --  166*  --   --  199*  BUN 84*  --  34*  --   --  39*  --   --  40*  CREATININE 3.25*  --  1.68*  --   --  2.02*  --   --  1.95*  CALCIUM 9.0  --  8.3*  --   --  8.1*  --   --  7.9*  PHOS 4.7*  --   --   --   --  3.1  --   --  3.1  < > = values in this interval not displayed. Liver Function Tests:  Recent Labs Lab 04/26/13 2045 04/27/13 1635 04/28/13 0240  ALBUMIN 3.5 3.0* 2.9*   No results found for this basename: LIPASE, AMYLASE,  in the last 168 hours No results found for this basename: AMMONIA,  in the last 168 hours CBC:  Recent Labs Lab 04/26/13 2045 04/27/13 0356 04/28/13 0240  WBC 4.4 5.1 5.7  NEUTROABS 4.0  --   --   HGB 9.3* 8.7* 8.0*  HCT 28.8* 26.6* 25.1*  MCV 97.0 95.3 97.7  PLT 234 249 232   Cardiac Enzymes:  Recent Labs Lab 04/26/13 2055 04/27/13 0319 04/27/13 0830  TROPONINI <0.30 <0.30 <0.30   BNP (last 3 results) No results found for this basename: PROBNP,  in the last 8760 hours CBG:  Recent Labs Lab 04/27/13 1251 04/27/13 1657 04/27/13 1939 04/27/13 2344 04/28/13 0813  GLUCAP 153* 162* 142* 198* 135*    Recent Results (from the past 240 hour(s))  MRSA PCR SCREENING     Status: None   Collection Time    04/26/13  9:54 PM      Result Value  Ref Range Status   MRSA by PCR NEGATIVE  NEGATIVE Final   Comment:            The GeneXpert MRSA Assay (FDA     approved for NASAL specimens     only), is one component of a     comprehensive MRSA colonization     surveillance program. It is  not     intended to diagnose MRSA     infection nor to guide or     monitor treatment for     MRSA infections.     Studies:  Recent x-ray studies have been reviewed in detail by the Attending Physician  Time spent :     Erin Hearing, [facility] Office  [phone] Pager [phone]  **If unable to reach the above provider after paging please contact the [place] @ [phone]  On-Call/Text Page:      [email or web address]      password [email or web address]  If 7PM-7AM, please contact night-coverage [email or web address] Password [email or web address] 04/28/2013, 11:04 AM   LOS: 2 days

## 2013-04-28 NOTE — Progress Notes (Signed)
  Echocardiogram 2D Echocardiogram has been performed.  Haley Cooper 04/28/2013, 9:47 AM

## 2013-04-28 NOTE — Progress Notes (Signed)
Called back 2 west to give report as nurse did not call back from [place]. Was put on hold. Called back for third time  and gave report. Nurse practioner Erin Hearing NP placing orders for a new episode now of diarrhea.  Also notified 2 west nurse of this information.

## 2013-04-28 NOTE — Progress Notes (Signed)
Patient seen, examined and discussed with my nurse practitioner. Agree with above. Echocardiogram noted grade 2 diastolic dysfunction.  Patient continues to slowly improve. Recheck labs in the morning. Transferring to medical floor.

## 2013-04-29 ENCOUNTER — Encounter (HOSPITAL_COMMUNITY): Payer: Self-pay | Admitting: General Practice

## 2013-04-29 DIAGNOSIS — I4891 Unspecified atrial fibrillation: Secondary | ICD-10-CM

## 2013-04-29 LAB — PROTEIN ELECTROPHORESIS, SERUM
Albumin ELP: 51.1 % — ABNORMAL LOW (ref 55.8–66.1)
Alpha-1-Globulin: 6.1 % — ABNORMAL HIGH (ref 2.9–4.9)
Alpha-2-Globulin: 12.1 % — ABNORMAL HIGH (ref 7.1–11.8)
BETA 2: 4.8 % (ref 3.2–6.5)
BETA GLOBULIN: 5.8 % (ref 4.7–7.2)
GAMMA GLOBULIN: 20.1 % — AB (ref 11.1–18.8)
M-Spike, %: NOT DETECTED g/dL
Total Protein ELP: 7.5 g/dL (ref 6.0–8.3)

## 2013-04-29 LAB — CBC
HCT: 26.4 % — ABNORMAL LOW (ref 36.0–46.0)
HEMOGLOBIN: 8.5 g/dL — AB (ref 12.0–15.0)
MCH: 31.4 pg (ref 26.0–34.0)
MCHC: 32.2 g/dL (ref 30.0–36.0)
MCV: 97.4 fL (ref 78.0–100.0)
Platelets: 190 10*3/uL (ref 150–400)
RBC: 2.71 MIL/uL — ABNORMAL LOW (ref 3.87–5.11)
RDW: 14.2 % (ref 11.5–15.5)
WBC: 5.2 10*3/uL (ref 4.0–10.5)

## 2013-04-29 LAB — RENAL FUNCTION PANEL
Albumin: 2.9 g/dL — ABNORMAL LOW (ref 3.5–5.2)
BUN: 31 mg/dL — ABNORMAL HIGH (ref 6–23)
CALCIUM: 8.3 mg/dL — AB (ref 8.4–10.5)
CHLORIDE: 106 meq/L (ref 96–112)
CO2: 26 meq/L (ref 19–32)
Creatinine, Ser: 1.52 mg/dL — ABNORMAL HIGH (ref 0.50–1.10)
GFR, EST AFRICAN AMERICAN: 38 mL/min — AB (ref >90)
GFR, EST NON AFRICAN AMERICAN: 33 mL/min — AB (ref >90)
GLUCOSE: 173 mg/dL — AB (ref 70–99)
POTASSIUM: 5.1 meq/L (ref 3.7–5.3)
Phosphorus: 2.6 mg/dL (ref 2.3–4.6)
SODIUM: 142 meq/L (ref 137–147)

## 2013-04-29 LAB — GI PATHOGEN PANEL BY PCR, STOOL
C DIFFICILE TOXIN A/B: POSITIVE
CAMPYLOBACTER BY PCR: NEGATIVE
Cryptosporidium by PCR: NEGATIVE
E coli (ETEC) LT/ST: NEGATIVE
E coli (STEC): NEGATIVE
E coli 0157 by PCR: NEGATIVE
G LAMBLIA BY PCR: NEGATIVE
NOROVIRUS G1/G2: NEGATIVE
ROTAVIRUS A BY PCR: POSITIVE
SALMONELLA BY PCR: NEGATIVE
Shigella by PCR: NEGATIVE

## 2013-04-29 LAB — GLUCOSE, CAPILLARY
GLUCOSE-CAPILLARY: 160 mg/dL — AB (ref 70–99)
Glucose-Capillary: 179 mg/dL — ABNORMAL HIGH (ref 70–99)
Glucose-Capillary: 179 mg/dL — ABNORMAL HIGH (ref 70–99)
Glucose-Capillary: 170 mg/dL — ABNORMAL HIGH (ref 70–99)

## 2013-04-29 LAB — PROTIME-INR
INR: 0.9 (ref 0.00–1.49)
PROTHROMBIN TIME: 12 s (ref 11.6–15.2)

## 2013-04-29 LAB — CLOSTRIDIUM DIFFICILE BY PCR: Toxigenic C. Difficile by PCR: NEGATIVE

## 2013-04-29 LAB — TSH: TSH: 9.924 u[IU]/mL — ABNORMAL HIGH (ref 0.350–4.500)

## 2013-04-29 MED ORDER — WARFARIN VIDEO: Freq: Once | Status: AC

## 2013-04-29 MED ORDER — COUMADIN BOOK
Freq: Once | Status: AC
  Administered 2013-04-29: 14:00:00
  Filled 2013-04-29: qty 1

## 2013-04-29 MED ORDER — WARFARIN SODIUM 5 MG PO TABS
5.0000 mg | ORAL_TABLET | Freq: Once | ORAL | Status: AC
  Administered 2013-04-29: 5 mg via ORAL
  Filled 2013-04-29: qty 1

## 2013-04-29 MED ORDER — WARFARIN - PHARMACIST DOSING INPATIENT: Freq: Every day | Status: DC

## 2013-04-29 NOTE — Evaluation (Signed)
Occupational Therapy Evaluation Patient Details Name: EDRIE NEUMAN MRN: [id number] DOB: 06/22/1940 Today's Date: 04/29/2013 Time: [id number] OT Time Calculation (min): 29 min  OT Assessment / Plan / Recommendation History of present illness Pt is a 73 yo female admitted 3 days after a fall in [place].  Pt found to have severe hyperkalemia and in renal failure.  Pt had emergent dialysis and went into afib with RVR.  Pt converted back to NSR on own.  Pt w ho DM and COPD.     Clinical Impression   Pt admitted with the above diagnosis and has the deficits listed below.  Pt would benefit from cont OT to increase I and efficiency with all adls so she can return to [facility] assisted living at her PLOF.    OT Assessment  Patient needs continued OT Services    Follow Up Recommendations  Home health OT;Supervision/Assistance - 24 hour;Other (comment) (may not need any follow OT depending on progress.)    Barriers to Discharge      Equipment Recommendations  None recommended by OT    Recommendations for Other Services    Frequency  Min 2X/week    Precautions / Restrictions Precautions Precautions: Fall Precaution Comments: Pt falls often per report and several times the week leading up to admission.   Restrictions Weight Bearing Restrictions: No   Pertinent Vitals/Pain Pt HR 68-80 during session.  No c/o pain.    ADL  Eating/Feeding: Performed;Independent Where Assessed - Eating/Feeding: Chair Grooming: Performed;Wash/dry hands;Brushing hair;Supervision/safety Where Assessed - Grooming: Supported standing Upper Body Bathing: Simulated;Set up Where Assessed - Upper Body Bathing: Unsupported sitting Lower Body Bathing: Simulated;Minimal assistance Where Assessed - Lower Body Bathing: Unsupported sit to stand Upper Body Dressing: Simulated;Set up Where Assessed - Upper Body Dressing: Unsupported sitting Lower Body Dressing: Performed;Min guard Where Assessed - Lower Body Dressing:  Unsupported sit to stand Toilet Transfer: Performed;Min guard Toilet Transfer Method: Sit to stand;Stand pivot Science writer: Comfort height toilet;Grab bars Toileting - Clothing Manipulation and Hygiene: Performed;Min guard Where Assessed - Camera operator Manipulation and Hygiene: Standing Transfers/Ambulation Related to ADLs: Pt walked in room with HHA to min guard at times w/o walker.  Pt needs walker in room as always uses a walker with walker basket at home.   ADL Comments: Pt did very well with adls.  Pt can reach feet to bathe adn donn socks and shoes.  Pt fatigues quickly.    OT Diagnosis: Generalized weakness  OT Problem List: Decreased strength;Decreased activity tolerance;Impaired balance (sitting and/or standing);Decreased knowledge of use of DME or AE OT Treatment Interventions: Self-care/ADL training;DME and/or AE instruction;Therapeutic activities   OT Goals(Current goals can be found in the care plan section) Acute Rehab OT Goals Patient Stated Goal: to get back to [facility] OT Goal Formulation: With patient Time For Goal Achievement: 05/06/13 Potential to Achieve Goals: Good ADL Goals Pt Will Perform Grooming: with modified independence;standing Pt Will Perform Lower Body Bathing: with modified independence;sit to/from stand Pt Will Perform Lower Body Dressing: with modified independence;sit to/from stand Pt Will Perform Tub/Shower Transfer: Shower transfer;ambulating;rolling walker;shower seat;grab bars Additional ADL Goal #1: Pt will complete all toileting on comfort commode with rails and mod I. Additional ADL Goal #2: Pt will state 3 new energy conservation techniques she can use in A living to save energy and make her more efficient wit adls.  Visit Information  Last OT Received On: 04/29/13 Assistance Needed: +1 History of Present Illness: Pt is a 73 yo  female admitted 3 days after a fall in [place].  Pt found to have severe hyperkalemia and in  renal failure.  Pt had emergent dialysis and went into afib with RVR.  Pt converted back to NSR on own.  Pt w ho DM and COPD.         Prior [place] expects to be discharged to:: Assisted living Home Equipment: Shower seat;Grab bars - toilet;Grab bars - tub/shower;Walker - 2 wheels;Wheelchair - manual;Other (comment) (does not use w/c) Additional Comments: [facility].  Has lived there 6 years.  Is very I there but states help is available to her if necessary. Prior Function Level of Independence: Independent with assistive device(s) Comments: uses walker at all times.  Pt does own laundry,cleans room, bathes/dresses self.  meals are provided. Communication Communication: No difficulties Dominant Hand: Right         Vision/Perception Vision - History Baseline Vision: No visual deficits Patient Visual Report: No change from baseline Vision - Assessment Eye Alignment: Within Functional Limits Vision Assessment: Vision not tested Perception Perception: Within Functional Limits Praxis Praxis: Intact   Cognition  Cognition Arousal/Alertness: Awake/alert Behavior During Therapy: WFL for tasks assessed/performed Overall Cognitive Status: Within Functional Limits for tasks assessed Memory: Decreased short-term memory    Extremity/Trunk Assessment Upper Extremity Assessment Upper Extremity Assessment: Generalized weakness Lower Extremity Assessment Lower Extremity Assessment: Defer to PT evaluation Cervical / Trunk Assessment Cervical / Trunk Assessment: Normal     Mobility Bed Mobility Overal bed mobility:  (in chair on arrival.) Transfers Overall transfer level: Needs assistance Equipment used: 1 person hand held assist Transfers: Sit to/from [facility] Sit to Stand: Supervision Stand pivot transfers: Min guard General transfer comment: Pt safe when transferring.     Exercise     Balance Balance Overall  balance assessment: Needs assistance Sitting-balance support: Bilateral upper extremity supported;Feet supported Sitting balance-Leahy Scale: Normal Standing balance support: Bilateral upper extremity supported;During functional activity Standing balance-Leahy Scale: Fair Standing balance comment: Pt able to stand w no challenges without walker.  Pt much safer with walker. General Comments General comments (skin integrity, edema, etc.): Pt with sore on L lower leg anterior.     End of Session OT - End of Session Activity Tolerance: Patient limited by fatigue Patient left: in chair;with call bell/phone within reach Nurse Communication: Mobility status  GO     [place], Felder 04/29/2013, 11:03 AM [phone]

## 2013-04-29 NOTE — Progress Notes (Signed)
Pt. Seen and examined. Agree with the NP/PA-C note as written. BP improved with cardizem. Agree, there does not appear to be an additional indication for ASA. Will discontinue. Probably close to discharge from our standpoint - she does not have to be therapeutic with INR.  Pixie Casino, MD, [facility] Attending Cardiologist [place]

## 2013-04-29 NOTE — Progress Notes (Signed)
S:Feels better.  No new CO.  Foley cath still in? O:BP 159/53  Pulse 64  Temp(Src) 98.1 F (36.7 C) (Oral)  Resp 18  Ht 5\' 1"  (1.549 m)  Wt 109.5 kg (241 lb 6.5 oz)  BMI 45.64 kg/m2  SpO2 96%  Intake/Output Summary (Last 24 hours) at 04/29/13 [id number] Last data filed at 04/29/13 0505  Gross per 24 hour  Intake    200 ml  Output   2100 ml  Net  -1900 ml   Weight change:  [id number] and alert CVS:RRR Resp:clear Abd:+ BS NTND [id number] edema NEURO:Ox3 no asterixis   . amitriptyline  10 mg Oral QHS  . aspirin  81 mg Oral Daily  . cycloSPORINE  1 drop Both Eyes BID  . digoxin  0.25 mg Intravenous Once  . diltiazem  120 mg Oral Daily  . gabapentin  300 mg Oral TID  . heparin  5,000 Units Subcutaneous 3 times per day  . insulin aspart  0-9 Units Subcutaneous TID WC  . insulin glargine  5 Units Subcutaneous Daily  . pantoprazole  40 mg Oral Daily  . silver sulfADIAZINE  1 application Topical Daily  . Warfarin - Physician Dosing Inpatient   Does not apply q1800   US Renal  04/28/2013   CLINICAL DATA:  Increased BUN and creatinine.  EXAM: RENAL/URINARY TRACT ULTRASOUND COMPLETE  COMPARISON:  None.  FINDINGS: Right Kidney:  Length: 10.4 cm. Normal renal cortical thickness and echogenicity. No hydronephrosis. Within the superior pole of the right kidney there is a partially exophytic 1.2 x 0.8 x 1.0 cm hypoechoic mass.  Left Kidney:  Length: 11.1 cm. Echogenicity within normal limits. No mass or hydronephrosis visualized.  Bladder:  Decompressed with a Foley catheter.  IMPRESSION: 1. No hydronephrosis. 2. Hypoechoic mass within the superior pole of the right kidney is nonspecific in appearance however favored to represent a cyst. Consider followup ultrasound in 3 months to evaluate for stability if no additional diagnostic testing is performed in the interval.   Electronically Signed   By: Lovey Newcomer M.D.   On: 04/28/2013 07:39   BMET    Component Value Date/Time   NA 142 04/29/2013 0540    K 5.1 04/29/2013 0540   CL 106 04/29/2013 0540   CO2 26 04/29/2013 0540   GLUCOSE 173* 04/29/2013 0540   BUN 31* 04/29/2013 0540   CREATININE 1.52* 04/29/2013 0540   CALCIUM 8.3* 04/29/2013 0540   GFRNONAA 33* 04/29/2013 0540   GFRAA 38* 04/29/2013 0540   CBC    Component Value Date/Time   WBC 5.2 04/29/2013 0540   RBC 2.71* 04/29/2013 0540   HGB 8.5* 04/29/2013 0540   HCT 26.4* 04/29/2013 0540   PLT 190 04/29/2013 0540   MCV 97.4 04/29/2013 0540   MCH 31.4 04/29/2013 0540   MCHC 32.2 04/29/2013 0540   RDW 14.2 04/29/2013 0540   LYMPHSABS 0.3* 04/26/2013 2045   MONOABS 0.1 04/26/2013 2045   EOSABS 0.0 04/26/2013 2045   BASOSABS 0.0 04/26/2013 2045     Assessment: 1. AFR, improved 2. Hyperkalemia, resolved 3. A Fib, now NSR 4. DM 5. Anemia  Plan: 1.  .Will sign off.  She can FU with her PCP in [place] 2.  Dc foley   Candis Kabel T

## 2013-04-29 NOTE — Progress Notes (Signed)
Patient: Haley Cooper / Admit Date: 04/26/2013 / Date of Encounter: 04/29/2013, 9:16 AM Primary Cardiologist:  Subjective  No complaints. No CP or SOB. Doesn't seem to know much about her medical history or even why she was admitted to begin with. A+Ox3 but limited understanding. She does report hx of anemia, but is unsure of prior workup or why she's anemic - "it's all in the papers. I don't know nothing about it." Denies any unusual bleeding.   Objective   Telemetry: NSR  Physical Exam: Blood pressure 159/53, pulse 64, temperature 98.1 F (36.7 C), temperature source Oral, resp. rate 18, height 5\' 1"  (1.549 m), weight 241 lb 6.5 oz (109.5 kg), SpO2 96.00%. General: Well developed, well nourished, malodorous obese WF in no acute distress. Head: Normocephalic, atraumatic, sclera non-icteric, no xanthomas, nares are without discharge. Neck: JVP not elevated. Lungs: Clear bilaterally to auscultation without wheezes, rales, or rhonchi. Breathing is unlabored. Heart: RRR S1 S2 without murmurs, rubs, or gallops.  Abdomen: Soft, non-tender, non-distended with normoactive bowel sounds. No rebound/guarding. Extremities: No clubbing or cyanosis. No edema. Distal pedal pulses are 2+ and equal bilaterally. Neuro: Alert and oriented X 3. Moves all extremities spontaneously. Psych:  Responds to questions appropriately with a normal affect.   Intake/Output Summary (Last 24 hours) at 04/29/13 0916 Last data filed at 04/29/13 0505  Gross per 24 hour  Intake      0 ml  Output   2100 ml  Net  -2100 ml    Inpatient Medications:  . amitriptyline  10 mg Oral QHS  . aspirin  81 mg Oral Daily  . cycloSPORINE  1 drop Both Eyes BID  . digoxin  0.25 mg Intravenous Once  . diltiazem  120 mg Oral Daily  . gabapentin  300 mg Oral TID  . heparin  5,000 Units Subcutaneous 3 times per day  . insulin aspart  0-9 Units Subcutaneous TID WC  . insulin glargine  5 Units Subcutaneous Daily  . pantoprazole  40 mg  Oral Daily  . silver sulfADIAZINE  1 application Topical Daily  . Warfarin - Physician Dosing Inpatient   Does not apply q1800   Infusions:    Labs:  Recent Labs  04/28/13 0240 04/29/13 0540  NA 136* 142  K 5.2 5.1  CL 101 106  CO2 28 26  GLUCOSE 199* 173*  BUN 40* 31*  CREATININE 1.95* 1.52*  CALCIUM 7.9* 8.3*  PHOS 3.1 2.6    Recent Labs  04/28/13 0240 04/29/13 0540  ALBUMIN 2.9* 2.9*    Recent Labs  04/26/13 2045  04/28/13 0240 04/29/13 0540  WBC 4.4  < > 5.7 5.2  NEUTROABS 4.0  --   --   --   HGB 9.3*  < > 8.0* 8.5*  HCT 28.8*  < > 25.1* 26.4*  MCV 97.0  < > 97.7 97.4  PLT 234  < > 232 190  < > = values in this interval not displayed.  Recent Labs  04/26/13 2055 04/27/13 0319 04/27/13 0830  TROPONINI <0.30 <0.30 <0.30   No components found with this basename: POCBNP,   Recent Labs  04/28/13 1130  HGBA1C 6.1*     Radiology/Studies:  US Renal  04/28/2013   CLINICAL DATA:  Increased BUN and creatinine.  EXAM: RENAL/URINARY TRACT ULTRASOUND COMPLETE  COMPARISON:  None.  FINDINGS: Right Kidney:  Length: 10.4 cm. Normal renal cortical thickness and echogenicity. No hydronephrosis. Within the superior pole of the right kidney there is  a partially exophytic 1.2 x 0.8 x 1.0 cm hypoechoic mass.  Left Kidney:  Length: 11.1 cm. Echogenicity within normal limits. No mass or hydronephrosis visualized.  Bladder:  Decompressed with a Foley catheter.  IMPRESSION: 1. No hydronephrosis. 2. Hypoechoic mass within the superior pole of the right kidney is nonspecific in appearance however favored to represent a cyst. Consider followup ultrasound in 3 months to evaluate for stability if no additional diagnostic testing is performed in the interval.   Electronically Signed   By: Lovey Newcomer M.D.   On: 04/28/2013 07:39   Dg Chest Port 1 View  04/27/2013   CLINICAL DATA:  HD catheter placement verification.  EXAM: PORTABLE CHEST - 1 VIEW  COMPARISON:  None.  FINDINGS: The heart  is enlarged. There is moderate vascular congestion. Hemodialysis catheter has been placed from a right supraclavicular/ internal jugular approach. The tips appear to be at or just below the SVC/RA junction. There is no visible pneumothorax.  IMPRESSION: HD catheter tips project over the region of the cavoatrial junction. There is cardiomegaly with moderate vascular congestion.   Electronically Signed   By: Rolla Flatten M.D.   On: 04/27/2013 00:43     Assessment and Plan  1. Acute renal failure with hyperkalemia s/p emergent HD (in setting of 3 week use of Bactrim for cellulitis) 2. Newly recognized atrial fib with RVR, spont converted to NSR; CHADSVASC = 5. 3. Chronic peripheral edema (likely chronic diastolic CHF), stable 4. HTN 5. DM 6. COPD 7. Diarrhea, GI pathogen pending 8. Anemia, unclear chronicity 9. Kidney cyst, will need outpatient followup  Digoxin discontinued yesterday. Will get first dose of Diltiazem today which should also help BP. Follow HR. It has been decided that she can be tried on Coumadin. Would recommend workup of anemia per IM as well - unclear baseline. No evidence of bleeding this admission and Hgb stable today. Not sure that she needs both aspirin and Coumadin together.   Signed, Melina Copa PA-C

## 2013-04-29 NOTE — Evaluation (Signed)
Physical Therapy Evaluation Patient Details Name: Haley Cooper MRN: [id number] DOB: 1940-07-29 Today's Date: 04/29/2013 Time: [id number] PT Time Calculation (min): 17 min  PT Assessment / Plan / Recommendation History of Present Illness  Pt is a 73 yo female admitted 3 days after a fall in [place].  Pt found to have severe hyperkalemia and in renal failure.  Pt had emergent dialysis and went into afib with RVR.  Pt converted back to NSR on own.  Pt w ho DM and COPD.    Clinical Impression  Pt appears to be functioning near baseline however does have report of frequent recent falls. Pt demo'd safe ambulation with RW and is safe to d/c back to assisted living once medically stable.     PT Assessment  Patient needs continued PT services    Follow Up Recommendations  No PT follow up;Supervision - Intermittent    Does the patient have the potential to tolerate intense rehabilitation      Barriers to Discharge        Equipment Recommendations  None recommended by PT    Recommendations for Other Services     Frequency Min 2X/week    Precautions / Restrictions Precautions Precautions: Fall Precaution Comments: pt with report of recent freq falls Restrictions Weight Bearing Restrictions: No   Pertinent Vitals/Pain Pt denies pain      Mobility  Bed Mobility Overal bed mobility:  (pt up in chair upon PT arrival) Transfers Overall transfer level: Needs assistance Equipment used: Rolling walker (2 wheeled) Transfers: Sit to/from Stand Sit to Stand: Supervision General transfer comment: pt demo'd safe transfer technique Ambulation/Gait Ambulation/Gait assistance: Min guard Ambulation Distance (Feet): 150 Feet Assistive device: Rolling walker (2 wheeled) Gait Pattern/deviations: Step-through pattern Gait velocity: wfl for age General Gait Details: pt with SOB, SpO2 at 98%. 1 standing rest break    Exercises     PT Diagnosis: Generalized weakness  PT Problem List: Decreased  strength;Decreased activity tolerance;Decreased balance;Decreased mobility PT Treatment Interventions: DME instruction;Gait training;Functional mobility training;Therapeutic activities;Therapeutic exercise;Balance training     PT Goals(Current goals can be found in the care plan section) Acute Rehab PT Goals Patient Stated Goal: home asap PT Goal Formulation: With patient Time For Goal Achievement: 05/06/13 Potential to Achieve Goals: Good  Visit Information  Last PT Received On: 04/29/13 Assistance Needed: +1 History of Present Illness: Pt is a 73 yo female admitted 3 days after a fall in [place].  Pt found to have severe hyperkalemia and in renal failure.  Pt had emergent dialysis and went into afib with RVR.  Pt converted back to NSR on own.  Pt w ho DM and COPD.         Prior [place] expects to be discharged to:: Assisted living Home Equipment: Shower seat;Grab bars - toilet;Grab bars - tub/shower;Walker - 2 wheels;Wheelchair - Education administrator (comment) Additional Comments: [facility].  Has lived there 6 years.  Is very I there but states help is available to her if necessary. Prior Function Level of Independence: Independent with assistive device(s) Comments: uses walker. reports dressing/bathing independently. they provide meals Communication Communication: No difficulties Dominant Hand: Right    Cognition  Cognition Arousal/Alertness: Awake/alert Behavior During Therapy: WFL for tasks assessed/performed Overall Cognitive Status: Within Functional Limits for tasks assessed    Extremity/Trunk Assessment Upper Extremity Assessment Upper Extremity Assessment: Generalized weakness Lower Extremity Assessment Lower Extremity Assessment: Generalized weakness (noted bruising/skin tears from fall) Cervical / Trunk Assessment Cervical / Trunk Assessment:  Normal   Balance Balance Standing balance support: Bilateral upper extremity  supported Standing balance-Leahy Scale: Fair Standing balance comment: pt with increased stabiltiy with RW  End of Session PT - End of Session Equipment Utilized During Treatment: Gait belt Activity Tolerance: Patient tolerated treatment well Patient left: in chair;with call bell/phone within reach Nurse Communication: Mobility status  GP     Kingsley Callander 04/29/2013, 3:54 PM  Kittie Plater, PT, DPT Pager #: [phone] Office #: [phone]

## 2013-04-29 NOTE — Progress Notes (Addendum)
Progress Note  Haley Cooper TML:[id number] DOB: [birth year] DOA: 04/26/2013 PCP: Glenda Chroman., MD  Brief narrative: 9 yr ? known multifactorial dyspnea, possible underlying CAD with prior abn Cardiolyte [see notes from [facility]], ?OHS/OSA [reported used to be on CPAP ], prior smoker for 27 years +Mod Pulm Art Htn, h/o Chronic renal insuff, Anemia and assisted living facility resident admitted 04/26/13 from outside hospital, [facility] ED with acute renal insufficiency with hyperkalemia-in a setting of 3 week use Bactrim for lower extremity cellulitis prescribed by her podiatrist. Also on lisinopril and Lasix prior to admit. She stated that she had multiple falls over the past week, one when she was at the supermarket and another fall that was unprovoked a day to day and a half ago and was urged by her assisted living staff to get medical attention.  Admission labs were significantly abnormal with a BUN 84, creatinine 3.2, potassium 7.6 EGFR 13-empirically treated calcium gluconate, Kayexalate, 40 IV Lasix as well as insulin D5     Assessment/Plan: Active Problems:   Hyperkalemia/Acute renal failure -resolving -per Nephro -suspect caused by Bactrim in setting of presumed CKD in setting of ACE I and Lasix -renal stopping HD and removing temp HD cath - Nephrology signed off 3/10. Can followup with her PCP at discharge.    CKD (chronic kidney disease), stage III -baseline Scr unknown but suspected to have underlying CKD    DM2 (diabetes mellitus, type 2) -on Glucotrol, Lantus and Lispro for meal coverage at home -hold OHAs until renal function stable due to clearance issues - Reasonable inpatient control on low-dose Lantus and SSI. Hemoglobin A1c 6.1.    Chronic CHF -ECHO: Mild LVH, LVEF 55-60% and grade 2 diastolic dysfunction. -? History of CAD    New onset a-fib -now maintaining NSR -per Cards -on digoxin but since PAF Cards rec CCB or BB instead long term -ASA 81 mg added and then  discontinued by cardiology. On 3/10.discussed with Dr. Debara Pickett, Cardiology: recommended starting Coumadin. Please see Dr. Tanna Furry note on 04/27/13 -TSH elevated. We'll check free T4 and free T3.    Diarrhea -r/o c diff and viral etiologies - 1x lomotil until sure not infectious - States that has not had any diarrhea since yesterday.    Hypotension -resolved and likely due to RVR    COPD (chronic obstructive pulmonary disease) -compensated without wheeze     Obesity- BMI 45/Sleep apnea, obstructive -used to wear CPAP- follow while IP  Anemia - Stable  DVT prophylaxis: Subcutaneous heparin Code Status: Full code Family Communication: No family at bedside Disposition Plan/Expected LOS: Possible discharge next 24-48 hours   Consultants: Nephrology Cardiology  Procedures: 2-D echocardiogram -results as above  Insertion central venous hemodialysis catheter  Antibiotics: None  HPI/Subjective: Patient denies complaints. No diarrhea since yesterday. Denies dyspnea or chest pain..  Objective: Blood pressure 121/81, pulse 64, temperature 98.4 F (36.9 C), temperature source Oral, resp. rate 18, height _0  (1.549 m), weight 109.5 kg (241 lb 6.5 oz), SpO2 96.00%.  Intake/Output Summary (Last 24 hours) at 04/29/13 1631 Last data filed at 04/29/13 1300  Gross per 24 hour  Intake    480 ml  Output    700 ml  Net   -220 ml     Exam: General: No acute respiratory distress Lungs: Slightly diminished breath sounds in the bases but otherwise clear to auscultation Cardiovascular: Regular rate and rhythm without murmur gallop or rub normal S1 and S2, 1+ peripheral bilateral lower extremity  edema without JVD. Telemetry: Sinus rhythm-sinus bradycardia in the 50s Abdomen: Nontender, nondistended, soft, bowel sounds positive, no rebound, no ascites, no appreciable mass Musculoskeletal: No significant cyanosis, clubbing of bilateral lower extremities Neurological: Alert and oriented x  3, moves all extremities x 4 without focal neurological deficits, CN 2-12 intact  Scheduled Meds:  Scheduled Meds: . amitriptyline  10 mg Oral QHS  . cycloSPORINE  1 drop Both Eyes BID  . digoxin  0.25 mg Intravenous Once  . diltiazem  120 mg Oral Daily  . gabapentin  300 mg Oral TID  . heparin  5,000 Units Subcutaneous 3 times per day  . insulin aspart  0-9 Units Subcutaneous TID WC  . insulin glargine  5 Units Subcutaneous Daily  . pantoprazole  40 mg Oral Daily  . silver sulfADIAZINE  1 application Topical Daily  . warfarin   Does not apply Once  . Warfarin - Physician Dosing Inpatient   Does not apply q1800   Continuous Infusions:   Data Reviewed: Basic Metabolic Panel:  Recent Labs Lab 04/26/13 2045  04/27/13 0554  04/27/13 1635 04/27/13 1700 04/27/13 2107 04/28/13 0240 04/29/13 0540  NA 133*  --  138  --  136*  --   --  136* 142  K 7.6*  < > 4.8  4.9  < > 5.1 5.3 5.3 5.2 5.1  CL 100  --  99  --  98  --   --  101 106  CO2 19  --  26  --  28  --   --  28 26  GLUCOSE 138*  --  116*  --  166*  --   --  199* 173*  BUN 84*  --  34*  --  39*  --   --  40* 31*  CREATININE 3.25*  --  1.68*  --  2.02*  --   --  1.95* 1.52*  CALCIUM 9.0  --  8.3*  --  8.1*  --   --  7.9* 8.3*  PHOS 4.7*  --   --   --  3.1  --   --  3.1 2.6  < > = values in this interval not displayed. Liver Function Tests:  Recent Labs Lab 04/26/13 2045 04/27/13 1635 04/28/13 0240 04/29/13 0540  ALBUMIN 3.5 3.0* 2.9* 2.9*   No results found for this basename: LIPASE, AMYLASE,  in the last 168 hours No results found for this basename: AMMONIA,  in the last 168 hours CBC:  Recent Labs Lab 04/26/13 2045 04/27/13 0356 04/28/13 0240 04/29/13 0540  WBC 4.4 5.1 5.7 5.2  NEUTROABS 4.0  --   --   --   HGB 9.3* 8.7* 8.0* 8.5*  HCT 28.8* 26.6* 25.1* 26.4*  MCV 97.0 95.3 97.7 97.4  PLT 234 249 232 190   Cardiac Enzymes:  Recent Labs Lab 04/26/13 2055 04/27/13 0319 04/27/13 0830  TROPONINI  <0.30 <0.30 <0.30   BNP (last 3 results) No results found for this basename: PROBNP,  in the last 8760 hours CBG:  Recent Labs Lab 04/28/13 1243 04/28/13 1649 04/28/13 2117 04/29/13 0612 04/29/13 1130  GLUCAP 161* 163* 213* 160* 179*    Recent Results (from the past 240 hour(s))  MRSA PCR SCREENING     Status: None   Collection Time    04/26/13  9:54 PM      Result Value Ref Range Status   MRSA by PCR NEGATIVE  NEGATIVE Final   Comment:  The GeneXpert MRSA Assay (FDA     approved for NASAL specimens     only), is one component of a     comprehensive MRSA colonization     surveillance program. It is not     intended to diagnose MRSA     infection nor to guide or     monitor treatment for     MRSA infections.  CLOSTRIDIUM DIFFICILE BY PCR     Status: None   Collection Time    04/28/13 11:07 PM      Result Value Ref Range Status   C difficile by pcr NEGATIVE  NEGATIVE Final     Studies:  Recent x-ray studies have been reviewed in detail by the Attending Physician  Time spent : 53 mins  Haley Lastinger, MD, FACP, [place]. [facility] Pager [phone]  If 7PM-7AM, please contact night-coverage [email or web address] Password [email or web address] 04/29/2013, 4:31 PM   LOS: 3 days

## 2013-04-29 NOTE — Progress Notes (Signed)
ANTICOAGULATION CONSULT NOTE - Initial Consult  Pharmacy Consult for Coumadin Indication: atrial fibrillation  No Known Allergies  Patient Measurements: Height: 5\' 1"  (154.9 cm) Weight: 241 lb 6.5 oz (109.5 kg) IBW/kg (Calculated) : 47.8  Vital Signs: Temp: 98.4 F (36.9 C) (03/10 1400) Temp src: Oral (03/10 1400) BP: 121/81 mmHg (03/10 1400) Pulse Rate: 64 (03/10 1400)  Labs:  Recent Labs  04/26/13 2045 04/26/13 2055 04/27/13 0319 04/27/13 0356  04/27/13 0830 04/27/13 1635 04/28/13 0240 04/28/13 1004 04/29/13 0540  HGB 9.3*  --   --  8.7*  --   --   --  8.0*  --  8.5*  HCT 28.8*  --   --  26.6*  --   --   --  25.1*  --  26.4*  PLT 234  --   --  249  --   --   --  232  --  190  APTT 26  --   --   --   --   --   --   --   --   --   LABPROT 13.3  --   --   --   --   --   --   --  13.1 12.0  INR 1.03  --   --   --   --   --   --   --  1.01 0.90  CREATININE 3.25*  --   --   --   < >  --  2.02* 1.95*  --  1.52*  TROPONINI  --  <0.30 <0.30  --   --  <0.30  --   --   --   --   < > = values in this interval not displayed.  Estimated Creatinine Clearance: 38.3 ml/min (by C-G formula based on Cr of 1.52).   Medical History: Past Medical History  Diagnosis Date  . DM2 (diabetes mellitus, type 2)   . COPD (chronic obstructive pulmonary disease)   . HTN (hypertension)   . CHF (congestive heart failure)   . Shortness of breath   . Chronic kidney disease 04/2013     ACUTE   . Arthritis     HANDS  . Anemia      Assessment: 72yof admitted with ARF s/p bactrium use.  She is found to be in Afib now CVR on diltiazem.  She is being started on Coumadin for [facility].  INR 0.9, CBC low stable.    Goal of Therapy:  INR 2-3 Monitor platelets by anticoagulation protocol: Yes   Plan:  Coumadin 5mg  x1 Daily protime  Bonnita Nasuti Pharm.D. CPP, BCPS Clinical Pharmacist [phone] 04/29/2013 5:32 PM

## 2013-04-30 DIAGNOSIS — G4733 Obstructive sleep apnea (adult) (pediatric): Secondary | ICD-10-CM

## 2013-04-30 LAB — CBC
HEMATOCRIT: 28.9 % — AB (ref 36.0–46.0)
Hemoglobin: 9.1 g/dL — ABNORMAL LOW (ref 12.0–15.0)
MCH: 30.8 pg (ref 26.0–34.0)
MCHC: 31.5 g/dL (ref 30.0–36.0)
MCV: 98 fL (ref 78.0–100.0)
Platelets: 221 10*3/uL (ref 150–400)
RBC: 2.95 MIL/uL — AB (ref 3.87–5.11)
RDW: 14.1 % (ref 11.5–15.5)
WBC: 7.1 10*3/uL (ref 4.0–10.5)

## 2013-04-30 LAB — RENAL FUNCTION PANEL
Albumin: 2.9 g/dL — ABNORMAL LOW (ref 3.5–5.2)
BUN: 28 mg/dL — ABNORMAL HIGH (ref 6–23)
CALCIUM: 8.7 mg/dL (ref 8.4–10.5)
CO2: 24 mEq/L (ref 19–32)
Chloride: 106 mEq/L (ref 96–112)
Creatinine, Ser: 1.4 mg/dL — ABNORMAL HIGH (ref 0.50–1.10)
GFR, EST AFRICAN AMERICAN: 42 mL/min — AB (ref >90)
GFR, EST NON AFRICAN AMERICAN: 37 mL/min — AB (ref >90)
Glucose, Bld: 204 mg/dL — ABNORMAL HIGH (ref 70–99)
Phosphorus: 2.5 mg/dL (ref 2.3–4.6)
Potassium: 5.3 mEq/L (ref 3.7–5.3)
SODIUM: 139 meq/L (ref 137–147)

## 2013-04-30 LAB — GLUCOSE, CAPILLARY
Glucose-Capillary: 214 mg/dL — ABNORMAL HIGH (ref 70–99)
Glucose-Capillary: 180 mg/dL — ABNORMAL HIGH (ref 70–99)

## 2013-04-30 LAB — PROTIME-INR
INR: 0.99 (ref 0.00–1.49)
Prothrombin Time: 12.9 seconds (ref 11.6–15.2)

## 2013-04-30 LAB — T4, FREE: FREE T4: 1.18 ng/dL (ref 0.80–1.80)

## 2013-04-30 LAB — T3, FREE: T3, Free: 2.6 pg/mL (ref 2.3–4.2)

## 2013-04-30 MED ORDER — INSULIN GLARGINE 100 UNIT/ML ~~LOC~~ SOLN: 12.0000 [IU] | SUBCUTANEOUS | Status: AC

## 2013-04-30 MED ORDER — FUROSEMIDE 40 MG PO TABS: 40.0000 mg | ORAL_TABLET | Freq: Every day | ORAL | Status: DC | PRN

## 2013-04-30 MED ORDER — GABAPENTIN 300 MG PO CAPS: 300.0000 mg | ORAL_CAPSULE | Freq: Two times a day (BID) | ORAL | Status: DC

## 2013-04-30 MED ORDER — INSULIN LISPRO 100 UNIT/ML ~~LOC~~ SOLN: 1.0000 [IU] | Freq: Three times a day (TID) | SUBCUTANEOUS | Status: DC

## 2013-04-30 MED ORDER — WARFARIN SODIUM 5 MG PO TABS: 5.0000 mg | ORAL_TABLET | Freq: Every day | ORAL | Status: DC

## 2013-04-30 MED ORDER — DILTIAZEM HCL ER COATED BEADS 120 MG PO CP24: 120.0000 mg | ORAL_CAPSULE | Freq: Every day | ORAL | Status: DC

## 2013-04-30 NOTE — Progress Notes (Addendum)
Pt. Seen and examined. Agree with the NP/PA-C note as written.  Ok to discharge from our standpoint. INR has not come up yet. No aspirin. BP still somewhat high, may use hydralazine. Avoid ACE/ARB. Maintaining net negative, not on diuretic. Follow-up with Dr. Cristopher Peru.  Pixie Casino, MD, [facility] Attending Cardiologist [place]

## 2013-04-30 NOTE — Progress Notes (Signed)
Clinical Social Worker facilitated patient discharge by contacting the patient and facility, [facility]. Patient agreeable to this plan and arranging transport via patient's family . CSW will sign off, as social work intervention is no longer needed.  Jeanette Caprice, MSW, [place]

## 2013-04-30 NOTE — Progress Notes (Signed)
Clinical Social Work Department BRIEF PSYCHOSOCIAL ASSESSMENT 04/30/2013  Patient:  Haley Cooper, Haley Cooper     Account Number:  [id number]     [place] date:  04/26/2013  Clinical Social Worker:  Megan Salon  Date/Time:  04/30/2013 12:13 PM  Referred by:  Care Management  Date Referred:  04/30/2013 Referred for  ALF Placement   Other Referral:   Interview type:  Patient Other interview type:    PSYCHOSOCIAL DATA Living Status:  FACILITY Admitted from facility:  OTHER Level of care:  Assisted Living Primary support name:  Brailee Boudreau Primary support relationship to patient:  CHILD, ADULT Degree of support available:   Good    CURRENT CONCERNS Current Concerns  Post-Acute Placement   Other Concerns:    SOCIAL WORK ASSESSMENT / PLAN Per CM, patient is being discharged today and is from ALF. CSW was not aware of this until now. CSW went into room and introduced self to patient. Patient stated she is from [facility] and her son is coming to pick her up to be discharged.CSW contacted [facility] and faxed over clinicals to [facility]. CSW awaiting to hear back from facility to make sure they are able to take patient back today.   Assessment/plan status:  Psychosocial Support/Ongoing Assessment of Needs Other assessment/ plan:   Information/referral to community resources:   CSW information    PATIENT'S/FAMILY'S RESPONSE TO PLAN OF CARE: Patient states she is from [place], [place], [place]

## 2013-04-30 NOTE — Progress Notes (Signed)
Subjective: She feels good.  Ambulating well.  Objective: Vital signs in last 24 hours: Temp:  [98.4 F (36.9 C)-98.8 F (37.1 C)] 98.8 F (37.1 C) (03/11 0524) Pulse Rate:  [56-69] 56 (03/11 0524) Resp:  [18] 18 (03/11 0524) BP: (121-167)/(37-81) 167/37 mmHg (03/11 0524) SpO2:  [95 %-99 %] 95 % (03/11 0524) Last BM Date: 04/28/13  Intake/Output from previous day: 03/10 0701 - 03/11 0700 In: 1080 [P.O.:1080] Out: 1650 [Urine:1650] Intake/Output this shift:    Medications Current Facility-Administered Medications  Medication Dose Route Frequency Provider Last Rate Last Dose  . acetaminophen (TYLENOL) tablet 1,000 mg  1,000 mg Oral BID PRN Etta Quill, DO      . amitriptyline (ELAVIL) tablet 10 mg  10 mg Oral QHS Etta Quill, DO   10 mg at 04/29/13 2111  . benzonatate (TESSALON) capsule 200 mg  200 mg Oral TID PRN Etta Quill, DO      . cycloSPORINE (RESTASIS) 0.05 % ophthalmic emulsion 1 drop  1 drop Both Eyes BID Etta Quill, DO   1 drop at 04/29/13 2112  . digoxin (LANOXIN) 0.25 MG/ML injection 0.25 mg  0.25 mg Intravenous Once Dianne Dun, NP      . diltiazem (CARDIZEM CD) 24 hr capsule 120 mg  120 mg Oral Daily Pixie Casino, MD   120 mg at 04/29/13 1153  . feeding supplement (NEPRO CARB STEADY) liquid 237 mL  237 mL Oral PRN Rexene Agent, MD      . gabapentin (NEURONTIN) capsule 300 mg  300 mg Oral TID Etta Quill, DO   300 mg at 04/29/13 2112  . insulin aspart (novoLOG) injection 0-9 Units  0-9 Units Subcutaneous TID WC Etta Quill, DO   3 Units at 04/30/13 [phone]  . insulin glargine (LANTUS) injection 5 Units  5 Units Subcutaneous Daily Samella Parr, NP   5 Units at 04/29/13 1153  . pantoprazole (PROTONIX) EC tablet 40 mg  40 mg Oral Daily Nita Sells, MD   40 mg at 04/29/13 1153  . silver sulfADIAZINE (SILVADENE) 1 % cream 1 application  1 application Topical Daily Etta Quill, DO   1 application at [phone]    . Warfarin - Pharmacist Dosing Inpatient   Does not apply q1800 Modena Jansky, MD        PE: General appearance: alert, cooperative and no distress Lungs: Decresaed BS bilaterally.  No wheeze or rales. Heart: reg rhythm.  rate slow.  no MM Extremities: Trace LEE Pulses: 2+ and symmetric Skin: Warm and dry Neurologic: Grossly normal  Lab Results:   Recent Labs  04/28/13 0240 04/29/13 0540 04/30/13 0550  WBC 5.7 5.2 7.1  HGB 8.0* 8.5* 9.1*  HCT 25.1* 26.4* 28.9*  PLT 232 190 221   BMET  Recent Labs  04/28/13 0240 04/29/13 0540 04/30/13 0550  NA 136* 142 139  K 5.2 5.1 5.3  CL 101 106 106  CO2 28 26 24   GLUCOSE 199* 173* 204*  BUN 40* 31* 28*  CREATININE 1.95* 1.52* 1.40*  CALCIUM 7.9* 8.3* 8.7   PT/INR  Recent Labs  04/28/13 1004 04/29/13 0540 04/30/13 0550  LABPROT 13.1 12.0 12.9  INR 1.01 0.90 0.99    Assessment/Plan   Active Problems:   New onset a-fib   DM2 (diabetes mellitus, type 2)   Hyperkalemia   Acute renal failure   Chronic diastolic CHF (congestive heart failure), NYHA class 2  Hypotension, unspecified   COPD (chronic obstructive pulmonary disease)   Obesity- BMI 45   CKD (chronic kidney disease), stage III   Sleep apnea, obstructive  Plan:  Still maintaining sinus bradycardia.  Coumadin added and INR 0.99.   BP still intermittantly elevated.  Consider adding hydralazine.  On Diltiazem 120mg  daily. Normal EF with Grade two diastolic dysfunction.  CKD- mildly decreased SCr.  Would avoid ACE/ARB.   Hgb increased to 9.1.  Net fluids:  -0.6L/-4.7L.  May need prn diuretic.  Will arrange follow up.   LOS: 4 days    Jamill Wetmore 04/30/2013 8:15 AM

## 2013-04-30 NOTE — Care Management Note (Signed)
    Page 1 of 1   04/30/2013     2:16:09 PM   CARE MANAGEMENT NOTE 04/30/2013  Patient:  Haley Cooper, Haley Cooper   Account Number:  [id number]  Date Initiated:  04/30/2013  Documentation initiated by:  Laylah Riga  Subjective/Objective Assessment:   PT ADM ON 04/26/13 WITH HYPERKALEMIA, RENAL FAILURE, CHF. PTA, PT RESIDES AT [facility].     Action/Plan:   CONSULTED CSW TO FACILITATE RETURN TO ASSISTED LIVING FACILITY.  MD REQUESTS HHRN FOR DISEASE MGMT TO FOLLOW AT FACILITY.   Anticipated DC Date:  04/30/2013   Anticipated DC Plan:  ASSISTED LIVING / REST HOME  In-house referral  Clinical Social Worker      DC Forensic scientist  CM consult      [facility] Choice  HOME HEALTH   Choice offered to / List presented to:  C-1 Patient        [place] arranged  HH-1 RN  [place].   Status of service:  Completed, signed off Medicare Important Message given?   (If response is "NO", the following Medicare IM given date fields will be blank) Date Medicare IM given:   Date Additional Medicare IM given:    Discharge Disposition:  ASSISTED LIVING  Per UR Regulation:  Reviewed for med. necessity/level of care/duration of stay  If discussed at [place] of Stay Meetings, dates discussed:    Comments:

## 2013-04-30 NOTE — Discharge Instructions (Signed)
Information on my medicine - Coumadin   (Warfarin)  This medication education was reviewed with me or my healthcare representative as part of my discharge preparation.  The pharmacist that spoke with me during my hospital stay was:  Beverlee Nims, [facility]  Why was Coumadin prescribed for you? Coumadin was prescribed for you because you have a blood clot or a medical condition that can cause an increased risk of forming blood clots. Blood clots can cause serious health problems by blocking the flow of blood to the heart, lung, or brain. Coumadin can prevent harmful blood clots from forming. As a reminder your indication for Coumadin is:   Select from menu  What test will check on my response to Coumadin? While on Coumadin (warfarin) you will need to have an INR test regularly to ensure that your dose is keeping you in the desired range. The INR (international normalized ratio) number is calculated from the result of the laboratory test called prothrombin time (PT).  If an INR APPOINTMENT HAS NOT ALREADY BEEN MADE FOR YOU please schedule an appointment to have this lab work done by your health care provider within 7 days. Your INR goal is usually a number between:  2 to 3 or your provider may give you a more narrow range like 2-2.5.  Ask your health care provider during an office visit what your goal INR is.  What  do you need to  know  About  COUMADIN? Take Coumadin (warfarin) exactly as prescribed by your healthcare provider about the same time each day.  DO NOT stop taking without talking to the doctor who prescribed the medication.  Stopping without other blood clot prevention medication to take the place of Coumadin may increase your risk of developing a new clot or stroke.  Get refills before you run out.  What do you do if you miss a dose? If you miss a dose, take it as soon as you remember on the same day then continue your regularly scheduled regimen the next day.  Do not take two doses of  Coumadin at the same time.  Important Safety Information A possible side effect of Coumadin (Warfarin) is an increased risk of bleeding. You should call your healthcare provider right away if you experience any of the following:   Bleeding from an injury or your nose that does not stop.   Unusual colored urine (red or dark brown) or unusual colored stools (red or black).   Unusual bruising for unknown reasons.   A serious fall or if you hit your head (even if there is no bleeding).  Some foods or medicines interact with Coumadin (warfarin) and might alter your response to warfarin. To help avoid this:   Eat a balanced diet, maintaining a consistent amount of Vitamin K.   Notify your provider about major diet changes you plan to make.   Avoid alcohol or limit your intake to 1 drink for women and 2 drinks for men per day. (1 drink is 5 oz. wine, 12 oz. beer, or 1.5 oz. liquor.)  Make sure that ANY health care provider who prescribes medication for you knows that you are taking Coumadin (warfarin).  Also make sure the healthcare provider who is monitoring your Coumadin knows when you have started a new medication including herbals and non-prescription products.  Coumadin (Warfarin)  Major Drug Interactions  Increased Warfarin Effect Decreased Warfarin Effect  Alcohol (large quantities) Antibiotics (esp. Septra/Bactrim, Flagyl, Cipro) Amiodarone (Cordarone) Aspirin (ASA) Cimetidine (Tagamet)  Megestrol (Megace) NSAIDs (ibuprofen, naproxen, etc.) Piroxicam (Feldene) Propafenone (Rythmol SR) Propranolol (Inderal) Isoniazid (INH) Posaconazole (Noxafil) Barbiturates (Phenobarbital) Carbamazepine (Tegretol) Chlordiazepoxide (Librium) Cholestyramine (Questran) Griseofulvin Oral Contraceptives Rifampin Sucralfate (Carafate) Vitamin K   Coumadin (Warfarin) Major Herbal Interactions  Increased Warfarin Effect Decreased Warfarin Effect  Garlic Ginseng Ginkgo biloba Coenzyme Q10 Green  tea St. Johns wort    Coumadin (Warfarin) FOOD Interactions  Eat a consistent number of servings per week of foods HIGH in Vitamin K (1 serving =  cup)  Collards (cooked, or boiled & drained) Kale (cooked, or boiled & drained) Mustard greens (cooked, or boiled & drained) Parsley *serving size only =  cup Spinach (cooked, or boiled & drained) Swiss chard (cooked, or boiled & drained) Turnip greens (cooked, or boiled & drained)  Eat a consistent number of servings per week of foods MEDIUM-HIGH in Vitamin K (1 serving = 1 cup)  Asparagus (cooked, or boiled & drained) Broccoli (cooked, boiled & drained, or raw & chopped) Brussel sprouts (cooked, or boiled & drained) *serving size only =  cup Lettuce, raw (green leaf, endive, romaine) Spinach, raw Turnip greens, raw & chopped   These websites have more information on Coumadin (warfarin):  FailFactory.se; VeganReport.com.au;

## 2013-04-30 NOTE — Discharge Summary (Addendum)
Physician Discharge Summary  Haley Cooper [id number] DOB: 02/23/1940 DOA: 04/26/2013  PCP: Glenda Chroman., MD  Admit date: 04/26/2013 Discharge date: 04/30/2013  Time spent: 45 minutes  Recommendations for Outpatient Follow-up:  1. Dr.Koneswaran 3/16 2.   PCP Dr.Vyas in 1 week 3. INR check 3/13, Goal INR 2-3, Newly started on Coumadin 4. Bmet in 1 week 5. May need lasix dose titrated from PRN to scheduled depending on clinical course and creatinine 6. Titrate lantus dose upon FU  Discharge Diagnoses:    Hyperkalemia   Acute renal failure   DM 2   Chronic diastolic CHF (congestive heart failure), NYHA class 2   New onset a-fib   Hypotension, unspecified   COPD (chronic obstructive pulmonary disease)   Obesity- BMI 45   CKD (chronic kidney disease), stage III   Sleep apnea, obstructive   Discharge Condition:stable  Diet recommendation: DM, heart healthy  Filed Weights   04/26/13 2021 04/27/13 0030 04/28/13 0407  Weight: 109.5 kg (241 lb 6.5 oz) 109.5 kg (241 lb 6.5 oz) 109.5 kg (241 lb 6.5 oz)    History of present illness:   1 yr ? known multifactorial dyspnea, possible underlying CAD with prior abn Cardiolite, suspected OHS/OSA, prior smoker for 27 years +Mod Pulm Art Htn, h/o Chronic renal insuff, Anemia and assisted living facility resident admitted 04/26/13 from outside hospital, [facility] ED with acute renal insufficiency with hyperkalemia-in a setting of 3 week use Bactrim for lower extremity cellulitis prescribed by her podiatrist. Also on lisinopril and Lasix prior to admit. She stated that she had multiple falls over the past week, one when she was at the supermarket and another fall that was unprovoked a day to day and a half ago and was urged by her assisted living staff to get medical attention.  Admission labs were significantly abnormal with a BUN 84, creatinine 3.2, potassium 7.6 GFR 13- Hospital Course:     Hyperkalemia/Acute renal failure  -resolving   -initially treated calcium gluconate, Kayexalate, 40 IV Lasix as well as insulin D5 -followed by Renal -suspect caused by Bactrim in setting of presumed CKD in setting of ACE I and Lasix  -She was dialyzed emergently on admission through an HD catheter, subsequently renal function improved, temporary HD cath removed - Nephrology signed off 3/10. Can followup with her PCP at discharge.  -creatinine 1.4 at discharge -ACE , NSAIDs stopped  CKD (chronic kidney disease), stage III  -baseline Scr unknown but suspected to have underlying CKD  -improved, see above  DM2 (diabetes mellitus, type 2)  -on Glucotrol, Lantus and Lispro for meal coverage at home  -hold oral hypoglycemics until renal function stable due to clearance issues and hence these stopped temporarily -inpatient reasonable control on lower-dose Lantus and SSI and hence dose decreased. Hemoglobin A1c 6.1.   Chronic CHF  -ECHO: Mild LVH, LVEF 55-60% and grade 2 diastolic dysfunction.  -resume Lasix PRN and then scheduled if need be -compensated at this time  New onset a-fib  -this was noted briefly on admission -now maintaining NSR  -followed by  Cards  -on digoxin but since PAF Cards recommended CCB or BB instead long term  -On 3/10.discussed with Dr. Debara Pickett, Cardiology: recommended starting Coumadin, this was started, needs INR check on 3/13 -TSH elevated. but T4 and free T3 normal  Diarrhea  -negative for c diff  -resolved  Hypotension  -resolved and likely due to RVR   COPD (chronic obstructive pulmonary disease)  -compensated   Obesity- BMI 45/Sleep  apnea, obstructive  -FU with Pulm for repeat sleepp study and CPAP titration  Anemia  - Stable   Procedures:  Hemodialysis  Consultations:  Cardiology  Renal  Discharge Exam: Filed Vitals:   04/30/13 0524  BP: 167/37  Pulse: 56  Temp: 98.8 F (37.1 C)  Resp: 18    General: AAOx3 Cardiovascular: S1S2/RRR Respiratory: CTAB  Discharge  Instructions   Future Appointments Provider Department Dept Phone   05/02/2013 10:00 AM [facility] [place] [phone]   05/05/2013 1:00 PM Herminio Commons, MD [place] [phone]       Medication List    ASK your doctor about these medications       acetaminophen 500 MG tablet  Commonly known as:  TYLENOL  Take 1,000 mg by mouth 2 (two) times daily as needed for mild pain.     alendronate 70 MG tablet  Commonly known as:  FOSAMAX  Take 70 mg by mouth once a week. Take with a full glass of water on an empty stomach.     amitriptyline 10 MG tablet  Commonly known as:  ELAVIL  Take 10 mg by mouth at bedtime.     benzonatate 200 MG capsule  Commonly known as:  TESSALON  Take 200 mg by mouth 3 (three) times daily as needed for cough.     calcium-vitamin D 500-200 MG-UNIT per tablet  Commonly known as:  OSCAL WITH D  Take 1 tablet by mouth 2 (two) times daily.     cycloSPORINE 0.05 % ophthalmic emulsion  Commonly known as:  RESTASIS  Place 1 drop into both eyes 2 (two) times daily.     furosemide 40 MG tablet  Commonly known as:  LASIX  Take 40 mg by mouth 2 (two) times daily.     gabapentin 300 MG capsule  Commonly known as:  NEURONTIN  Take 300 mg by mouth 3 (three) times daily.     glipiZIDE 5 MG 24 hr tablet  Commonly known as:  GLUCOTROL XL  Take 5 mg by mouth daily with breakfast.     insulin glargine 100 UNIT/ML injection  Commonly known as:  LANTUS  Inject 22 Units into the skin every morning.     insulin lispro 100 UNIT/ML injection  Commonly known as:  HUMALOG  Inject 10 Units into the skin daily before supper.     lisinopril 10 MG tablet  Commonly known as:  PRINIVIL,ZESTRIL  Take 10 mg by mouth every morning.     meloxicam 7.5 MG tablet  Commonly known as:  MOBIC  Take 7.5 mg by mouth daily as needed for pain.     omeprazole 20 MG tablet  Commonly known as:  PRILOSEC  OTC  Take 20 mg by mouth every morning.     silver sulfADIAZINE 1 % cream  Commonly known as:  SILVADENE  Apply 1 application topically daily.     simvastatin 40 MG tablet  Commonly known as:  ZOCOR  Take 40 mg by mouth every evening.     sulfamethoxazole-trimethoprim 800-160 MG per tablet  Commonly known as:  BACTRIM DS,SEPTRA DS  Take 1 tablet by mouth 2 (two) times daily.     triamcinolone cream 0.1 %  Commonly known as:  KENALOG  Apply 1 application topically daily.       No Known Allergies     Follow-up Information   Follow up with Renal Ultrasound In 3 months. (Followup  Renal cyst)        The results of significant diagnostics from this hospitalization (including imaging, microbiology, ancillary and laboratory) are listed below for reference.    Significant Diagnostic Studies: US Renal  04/28/2013   CLINICAL DATA:  Increased BUN and creatinine.  EXAM: RENAL/URINARY TRACT ULTRASOUND COMPLETE  COMPARISON:  None.  FINDINGS: Right Kidney:  Length: 10.4 cm. Normal renal cortical thickness and echogenicity. No hydronephrosis. Within the superior pole of the right kidney there is a partially exophytic 1.2 x 0.8 x 1.0 cm hypoechoic mass.  Left Kidney:  Length: 11.1 cm. Echogenicity within normal limits. No mass or hydronephrosis visualized.  Bladder:  Decompressed with a Foley catheter.  IMPRESSION: 1. No hydronephrosis. 2. Hypoechoic mass within the superior pole of the right kidney is nonspecific in appearance however favored to represent a cyst. Consider followup ultrasound in 3 months to evaluate for stability if no additional diagnostic testing is performed in the interval.   Electronically Signed   By: Lovey Newcomer M.D.   On: 04/28/2013 07:39   Dg Chest Port 1 View  04/27/2013   CLINICAL DATA:  HD catheter placement verification.  EXAM: PORTABLE CHEST - 1 VIEW  COMPARISON:  None.  FINDINGS: The heart is enlarged. There is moderate vascular congestion. Hemodialysis catheter has  been placed from a right supraclavicular/ internal jugular approach. The tips appear to be at or just below the SVC/RA junction. There is no visible pneumothorax.  IMPRESSION: HD catheter tips project over the region of the cavoatrial junction. There is cardiomegaly with moderate vascular congestion.   Electronically Signed   By: Rolla Flatten M.D.   On: 04/27/2013 00:43    Microbiology: Recent Results (from the past 240 hour(s))  MRSA PCR SCREENING     Status: None   Collection Time    04/26/13  9:54 PM      Result Value Ref Range Status   MRSA by PCR NEGATIVE  NEGATIVE Final   Comment:            The GeneXpert MRSA Assay (FDA     approved for NASAL specimens     only), is one component of a     comprehensive MRSA colonization     surveillance program. It is not     intended to diagnose MRSA     infection nor to guide or     monitor treatment for     MRSA infections.  CLOSTRIDIUM DIFFICILE BY PCR     Status: None   Collection Time    04/28/13 11:07 PM      Result Value Ref Range Status   C difficile by pcr NEGATIVE  NEGATIVE Final     Labs: Basic Metabolic Panel:  Recent Labs Lab 04/26/13 2045  04/27/13 0554  04/27/13 1635 04/27/13 1700 04/27/13 2107 04/28/13 0240 04/29/13 0540 04/30/13 0550  NA 133*  --  138  --  136*  --   --  136* 142 139  K 7.6*  < > 4.8  4.9  < > 5.1 5.3 5.3 5.2 5.1 5.3  CL 100  --  99  --  98  --   --  101 106 106  CO2 19  --  26  --  28  --   --  28 26 24   GLUCOSE 138*  --  116*  --  166*  --   --  199* 173* 204*  BUN 84*  --  34*  --  39*  --   --  40* 31* 28*  CREATININE 3.25*  --  1.68*  --  2.02*  --   --  1.95* 1.52* 1.40*  CALCIUM 9.0  --  8.3*  --  8.1*  --   --  7.9* 8.3* 8.7  PHOS 4.7*  --   --   --  3.1  --   --  3.1 2.6 2.5  < > = values in this interval not displayed. Liver Function Tests:  Recent Labs Lab 04/26/13 2045 04/27/13 1635 04/28/13 0240 04/29/13 0540 04/30/13 0550  ALBUMIN 3.5 3.0* 2.9* 2.9* 2.9*   No results  found for this basename: LIPASE, AMYLASE,  in the last 168 hours No results found for this basename: AMMONIA,  in the last 168 hours CBC:  Recent Labs Lab 04/26/13 2045 04/27/13 0356 04/28/13 0240 04/29/13 0540 04/30/13 0550  WBC 4.4 5.1 5.7 5.2 7.1  NEUTROABS 4.0  --   --   --   --   HGB 9.3* 8.7* 8.0* 8.5* 9.1*  HCT 28.8* 26.6* 25.1* 26.4* 28.9*  MCV 97.0 95.3 97.7 97.4 98.0  PLT 234 249 232 190 221   Cardiac Enzymes:  Recent Labs Lab 04/26/13 2055 04/27/13 0319 04/27/13 0830  TROPONINI <0.30 <0.30 <0.30   BNP: BNP (last 3 results) No results found for this basename: PROBNP,  in the last 8760 hours CBG:  Recent Labs Lab 04/29/13 1130 04/29/13 1652 04/29/13 2119 04/30/13 0613 04/30/13 1123  GLUCAP 179* 179* 170* 214* 180*       Signed:  Heide Brossart  [facility] 04/30/2013, 12:20 PM

## 2013-05-02 ENCOUNTER — Ambulatory Visit (INDEPENDENT_AMBULATORY_CARE_PROVIDER_SITE_OTHER): Payer: Medicare Other | Admitting: *Deleted

## 2013-05-02 DIAGNOSIS — I4891 Unspecified atrial fibrillation: Secondary | ICD-10-CM

## 2013-05-02 DIAGNOSIS — Z5181 Encounter for therapeutic drug level monitoring: Secondary | ICD-10-CM

## 2013-05-02 LAB — POCT INR: INR: 1.1

## 2013-05-05 ENCOUNTER — Encounter: Payer: Self-pay | Admitting: Cardiovascular Disease

## 2013-05-05 ENCOUNTER — Ambulatory Visit (INDEPENDENT_AMBULATORY_CARE_PROVIDER_SITE_OTHER): Payer: Medicare Other | Admitting: Cardiovascular Disease

## 2013-05-05 VITALS — BP 150/78 | HR 67 | Ht 59.0 in | Wt 232.4 lb

## 2013-05-05 DIAGNOSIS — N183 Chronic kidney disease, stage 3 unspecified: Secondary | ICD-10-CM

## 2013-05-05 DIAGNOSIS — E119 Type 2 diabetes mellitus without complications: Secondary | ICD-10-CM

## 2013-05-05 DIAGNOSIS — G4733 Obstructive sleep apnea (adult) (pediatric): Secondary | ICD-10-CM

## 2013-05-05 DIAGNOSIS — N179 Acute kidney failure, unspecified: Secondary | ICD-10-CM

## 2013-05-05 DIAGNOSIS — J449 Chronic obstructive pulmonary disease, unspecified: Secondary | ICD-10-CM

## 2013-05-05 DIAGNOSIS — I4891 Unspecified atrial fibrillation: Secondary | ICD-10-CM

## 2013-05-05 DIAGNOSIS — I5032 Chronic diastolic (congestive) heart failure: Secondary | ICD-10-CM

## 2013-05-05 DIAGNOSIS — E669 Obesity, unspecified: Secondary | ICD-10-CM

## 2013-05-05 DIAGNOSIS — Z5181 Encounter for therapeutic drug level monitoring: Secondary | ICD-10-CM

## 2013-05-05 MED ORDER — HYDRALAZINE HCL 25 MG PO TABS: 25.0000 mg | ORAL_TABLET | Freq: Three times a day (TID) | ORAL | Status: DC

## 2013-05-05 NOTE — Progress Notes (Signed)
Patient ID: Haley Cooper, female   DOB: Sep 10, 1940, 73 y.o.   MRN: [id number]      SUBJECTIVE: The patient was recently hospitalized for acute renal failure which required emergent dialysis (had been on Bactrim for lower extremity cellulitis along with an ACEI), hyperkalemia and a limited episode of atrial fibrillation. She has a history of falls but was discharged on a trial of warfarin given her high risk of stroke. She is not deemed to be a suitable candidate for novel anticoagulants. She also has a history of obesity, COPD, tobacco abuse, HTN, diabetes mellitus, and chronic diastolic heart failure. An echocardiogram on March 9 showed normal left ventricular systolic function, EF 0000000, grade 2 diastolic dysfunction, mild LVH, and mild left atrial enlargement. INR on 3/13 was 1.1. Creatinine 1.4 on 3/11.  An incomplete medication list was provided today, but it appears she was discharged on lisinopril, although I cannot confirm if she is actually taking this. She currently denies chest pain, palpitations, lightheadedness, dizziness and syncope. Her chronic leg swelling has not gotten any worse. Her shortness of breath, which is also chronic, has not gotten any worse. She chronically uses 2 L of oxygen. She denies any bleeding problems since starting warfarin.  No Known Allergies  Current Outpatient Prescriptions  Medication Sig Dispense Refill  . acetaminophen (TYLENOL) 500 MG tablet Take 1,000 mg by mouth 2 (two) times daily as needed for mild pain.      Marland Kitchen alendronate (FOSAMAX) 70 MG tablet Take 70 mg by mouth once a week. Take with a full glass of water on an empty stomach.      Marland Kitchen amitriptyline (ELAVIL) 10 MG tablet Take 10 mg by mouth at bedtime.      . benzonatate (TESSALON) 200 MG capsule Take 200 mg by mouth 3 (three) times daily as needed for cough.      . calcium-vitamin D (OSCAL WITH D) 500-200 MG-UNIT per tablet Take 1 tablet by mouth 2 (two) times daily.      . cycloSPORINE  (RESTASIS) 0.05 % ophthalmic emulsion Place 1 drop into both eyes 2 (two) times daily.      Marland Kitchen diltiazem (CARDIZEM CD) 120 MG 24 hr capsule Take 1 capsule (120 mg total) by mouth daily.      . furosemide (LASIX) 40 MG tablet Take 1 tablet (40 mg total) by mouth daily as needed.      . gabapentin (NEURONTIN) 300 MG capsule Take 1 capsule (300 mg total) by mouth 2 (two) times daily.      . insulin glargine (LANTUS) 100 UNIT/ML injection Inject 0.12 mLs (12 Units total) into the skin every morning.  10 mL  11  . insulin lispro (HUMALOG) 100 UNIT/ML injection Inject 1-8 Units into the skin 3 (three) times daily with meals. Use moderate sliding Scale      . omeprazole (PRILOSEC OTC) 20 MG tablet Take 20 mg by mouth every morning.      . silver sulfADIAZINE (SILVADENE) 1 % cream Apply 1 application topically daily.      . simvastatin (ZOCOR) 40 MG tablet Take 40 mg by mouth every evening.      . triamcinolone cream (KENALOG) 0.1 % Apply 1 application topically daily.      Marland Kitchen warfarin (COUMADIN) 5 MG tablet Take 1 tablet (5 mg total) by mouth daily. For GOAL INR 2-3       No current facility-administered medications for this visit.    Past Medical History  Diagnosis Date  .  DM2 (diabetes mellitus, type 2)   . COPD (chronic obstructive pulmonary disease)   . HTN (hypertension)   . CHF (congestive heart failure)   . Shortness of breath   . Chronic kidney disease 04/2013     ACUTE   . Arthritis     HANDS  . Anemia     Past Surgical History  Procedure Laterality Date  . Vein surgery      History   Social History  . Marital Status: Divorced    Spouse Name: N/A    Number of Children: N/A  . Years of Education: N/A   Occupational History  . Not on file.   Social History Main Topics  . Smoking status: Former Smoker    Quit date: 02/21/1984  . Smokeless tobacco: Never Used  . Alcohol Use: No  . Drug Use: No  . Sexual Activity: Not on file   Other Topics Concern  . Not on file    Social History Narrative  . No narrative on file     Filed Vitals:   05/05/13 1306  Height: 4\' 11"  (1.499 m)  Weight: 232 lb 6.4 oz (105.416 kg)   BP 150/78  Pulse 67   PHYSICAL EXAM General: NAD Neck: No JVD, no thyromegaly. Lungs: Clear but diminished to auscultation bilaterally with diminished respiratory effort. CV: Nondisplaced PMI.  Regular rate and rhythm, normal S1/S2, no S3/S4, no murmur. 1+ pitting pretibial edema with bilateral erythemia.  No carotid bruit.  Abdomen: Soft, obese, nontender, no distention.  Neurologic: Alert and oriented x 3.  Psych: Normal affect. Extremities: No clubbing or cyanosis.   ECG: reviewed and available in electronic records.      ASSESSMENT AND PLAN: 1. Atrial fibrillation: INR subtherapeutic on 3/13. CHADS-Vasc score of 5, thus high risk for stroke. Will enroll in anticoagulation clinic, with target INR of 2-3. HR is controlled on current dose of diltiazem. 2. Chronic diastolic heart failure: Does have chronic lower extremity edema. Will need repeat BMET. Continue current dose of Lasix. 3. HTN: need to avoid ACEI/ARB given h/o acute renal failure requiring hemodialysis. Will initiate hydralazine 25 mg tid for elevated BP.  Dispo: f/u 3 months.  Kate Sable, M.D., F.A.C.C.

## 2013-05-05 NOTE — Patient Instructions (Signed)
Your physician recommends that you schedule a follow-up appointment in: 3 months with Dr. Bronson Ing. This appointment will be scheduled today before you leave.   Your physician has recommended you make the following change in your medication:  Start: Hydralazine 25 MG take 1 tablet by mouth three times daily  Continue all other medications the same.   Your physician recommends that you return for lab work in: today for [employer] may go to one of the following locations to have lab work completed: [facility] [address] Dr. Edrick Oh office in [facility].   Please enroll in our coumadin clinic.

## 2013-05-05 NOTE — Addendum Note (Signed)
Addended by: Lewayne Bunting on: 05/05/2013 01:36 PM   Modules accepted: Orders

## 2013-05-05 NOTE — Addendum Note (Signed)
Addended by: Lewayne Bunting on: 05/05/2013 01:38 PM   Modules accepted: Orders

## 2013-05-06 ENCOUNTER — Ambulatory Visit (INDEPENDENT_AMBULATORY_CARE_PROVIDER_SITE_OTHER): Payer: Medicare Other | Admitting: *Deleted

## 2013-05-06 DIAGNOSIS — Z5181 Encounter for therapeutic drug level monitoring: Secondary | ICD-10-CM | POA: Insufficient documentation

## 2013-05-06 DIAGNOSIS — I4891 Unspecified atrial fibrillation: Secondary | ICD-10-CM

## 2013-05-06 LAB — POCT INR: INR: 1.3

## 2013-05-07 ENCOUNTER — Telehealth: Payer: Self-pay | Admitting: *Deleted

## 2013-05-07 DIAGNOSIS — E875 Hyperkalemia: Secondary | ICD-10-CM

## 2013-05-07 MED ORDER — SODIUM POLYSTYRENE SULFONATE 15 GM/60ML PO SUSP: ORAL | Status: DC

## 2013-05-07 NOTE — Telephone Encounter (Signed)
Received call from [facility] lab Shanon Brow) - reporting panic value on potassium lab - 6.4 (3.5 to 5.1)  Dr. Bronson Ing notified.  Per his orders:    1.  Kayexalate 15gm/104ml - daily x 3 days only for elevated potassium - script provided  2.  Repeat BMET after finish medication above - lab order provided   Georges Mouse with [facility] notified & will fax all info to her attention:  [phone]  Dr. Kate Sable / Lovina Reach, LPN

## 2013-05-13 ENCOUNTER — Ambulatory Visit (INDEPENDENT_AMBULATORY_CARE_PROVIDER_SITE_OTHER): Payer: Medicare Other | Admitting: *Deleted

## 2013-05-13 DIAGNOSIS — Z5181 Encounter for therapeutic drug level monitoring: Secondary | ICD-10-CM

## 2013-05-13 DIAGNOSIS — I4891 Unspecified atrial fibrillation: Secondary | ICD-10-CM

## 2013-05-13 LAB — POCT INR: INR: 1.9

## 2013-05-23 ENCOUNTER — Ambulatory Visit (INDEPENDENT_AMBULATORY_CARE_PROVIDER_SITE_OTHER): Payer: Medicare Other | Admitting: *Deleted

## 2013-05-23 DIAGNOSIS — I4891 Unspecified atrial fibrillation: Secondary | ICD-10-CM

## 2013-05-23 DIAGNOSIS — Z5181 Encounter for therapeutic drug level monitoring: Secondary | ICD-10-CM

## 2013-05-23 LAB — POCT INR: INR: 2.2

## 2013-05-29 ENCOUNTER — Telehealth: Payer: Self-pay | Admitting: *Deleted

## 2013-05-29 NOTE — Telephone Encounter (Signed)
Message copied by Laurine Blazer on Thu May 29, 2013  4:07 PM ------      Message from: Kate Sable A      Created: Wed May 28, 2013  1:50 PM       Slightly improved. ------

## 2013-05-29 NOTE — Telephone Encounter (Signed)
Notes Recorded by Laurine Blazer, LPN on [id number] at [id number] PM Med tech with [facility] notified.

## 2013-06-20 ENCOUNTER — Telehealth: Payer: Self-pay | Admitting: *Deleted

## 2013-06-20 ENCOUNTER — Ambulatory Visit (INDEPENDENT_AMBULATORY_CARE_PROVIDER_SITE_OTHER): Payer: Medicare Other | Admitting: *Deleted

## 2013-06-20 DIAGNOSIS — I4891 Unspecified atrial fibrillation: Secondary | ICD-10-CM

## 2013-06-20 DIAGNOSIS — Z5181 Encounter for therapeutic drug level monitoring: Secondary | ICD-10-CM

## 2013-06-20 LAB — POCT INR: INR: 1.9

## 2013-06-20 NOTE — Telephone Encounter (Signed)
Received call from [facility] regarding pt's dose and both sent an order and instructions on coumadin clinic instruction sheet  regarding her dose and faxed to [phone] attention Millie Later in afternoon received call from [facility] was on the phone with another pt and when went to answer phone they had hung up Called back to [facility] and was transferred to nurse and that was voice mail . So called back to [facility] and only got their recording as was after 5pm Left message to return call. Did not receive call back so called again and left message on phone voice mail 204. Again before left office left message on voice mail 204 and repeated times 2 the dosage instructions for this pt Left message for last time at around 5 50pm May 1st 2015

## 2013-06-23 ENCOUNTER — Telehealth: Payer: Self-pay | Admitting: *Deleted

## 2013-06-23 NOTE — Telephone Encounter (Signed)
Spoke with Lucinda  Nurse at [facility] and refaxed order on [facility] regarding her coumadin dose and verbally instructed Lucinda as to her correct dose.Also called and spoke with Zigmund Daniel at the [facility] office and she states that the fax sent on Friday (May 2,2015 ) did go through. Instructed Lucinda to call the [facility] office [phone] if she has any questions and she states understanding.

## 2013-07-04 ENCOUNTER — Ambulatory Visit (INDEPENDENT_AMBULATORY_CARE_PROVIDER_SITE_OTHER): Payer: Medicare Other | Admitting: *Deleted

## 2013-07-04 DIAGNOSIS — Z5181 Encounter for therapeutic drug level monitoring: Secondary | ICD-10-CM

## 2013-07-04 DIAGNOSIS — I4891 Unspecified atrial fibrillation: Secondary | ICD-10-CM

## 2013-07-04 LAB — POCT INR: INR: 1.8

## 2013-08-15 ENCOUNTER — Ambulatory Visit (INDEPENDENT_AMBULATORY_CARE_PROVIDER_SITE_OTHER): Payer: Medicare Other | Admitting: Cardiovascular Disease

## 2013-08-15 ENCOUNTER — Ambulatory Visit: Payer: Self-pay | Admitting: *Deleted

## 2013-08-15 ENCOUNTER — Encounter: Payer: Self-pay | Admitting: Cardiovascular Disease

## 2013-08-15 VITALS — BP 126/53 | HR 47 | Ht 59.0 in | Wt 233.0 lb

## 2013-08-15 DIAGNOSIS — I4891 Unspecified atrial fibrillation: Secondary | ICD-10-CM

## 2013-08-15 DIAGNOSIS — I5032 Chronic diastolic (congestive) heart failure: Secondary | ICD-10-CM

## 2013-08-15 DIAGNOSIS — I10 Essential (primary) hypertension: Secondary | ICD-10-CM

## 2013-08-15 DIAGNOSIS — J449 Chronic obstructive pulmonary disease, unspecified: Secondary | ICD-10-CM

## 2013-08-15 DIAGNOSIS — Z5181 Encounter for therapeutic drug level monitoring: Secondary | ICD-10-CM | POA: Diagnosis not present

## 2013-08-15 DIAGNOSIS — N183 Chronic kidney disease, stage 3 unspecified: Secondary | ICD-10-CM

## 2013-08-15 DIAGNOSIS — G4733 Obstructive sleep apnea (adult) (pediatric): Secondary | ICD-10-CM

## 2013-08-15 DIAGNOSIS — E669 Obesity, unspecified: Secondary | ICD-10-CM

## 2013-08-15 MED ORDER — DILTIAZEM HCL 30 MG PO TABS: 30.0000 mg | ORAL_TABLET | Freq: Two times a day (BID) | ORAL | Status: DC

## 2013-08-15 NOTE — Progress Notes (Signed)
Patient ID: Haley Cooper, female   DOB: January 03, 1941, 73 y.o.   MRN: [id number]      SUBJECTIVE: The patient is here to followup for chronic diastolic heart failure, hypertension, and atrial fibrillation. An echocardiogram on March 9 showed normal left ventricular systolic function, EF 0000000, grade 2 diastolic dysfunction, mild LVH, and mild left atrial enlargement. She has chronic leg swelling and dyspnea and uses 2 L of oxygen chronically. She is doing well. She walks outside every morning. She denies any worsening of her chronic dyspnea and leg swelling. She denies chest pain and palpitations. She has 127 grandchildren.     No Known Allergies  Current Outpatient Prescriptions  Medication Sig Dispense Refill  . acetaminophen (TYLENOL) 500 MG tablet Take 1,000 mg by mouth 2 (two) times daily as needed for mild pain.      Marland Kitchen alendronate (FOSAMAX) 70 MG tablet Take 70 mg by mouth once a week. Take with a full glass of water on an empty stomach.      Marland Kitchen amitriptyline (ELAVIL) 10 MG tablet Take 10 mg by mouth at bedtime.      . benzonatate (TESSALON) 200 MG capsule Take 200 mg by mouth 3 (three) times daily as needed for cough.      . calcium-vitamin D (OSCAL WITH D) 500-200 MG-UNIT per tablet Take 1 tablet by mouth 2 (two) times daily.      . cycloSPORINE (RESTASIS) 0.05 % ophthalmic emulsion Place 1 drop into both eyes 2 (two) times daily.      Marland Kitchen diltiazem (CARDIZEM CD) 120 MG 24 hr capsule Take 1 capsule (120 mg total) by mouth daily.      . furosemide (LASIX) 40 MG tablet Take 40 mg by mouth 2 (two) times daily.      Marland Kitchen gabapentin (NEURONTIN) 300 MG capsule Take 300 mg by mouth 3 (three) times daily.      Marland Kitchen glipiZIDE (GLUCOTROL) 5 MG tablet Take 5 mg by mouth daily before breakfast.      . hydrALAZINE (APRESOLINE) 25 MG tablet Take 1 tablet (25 mg total) by mouth 3 (three) times daily.  90 tablet  6  . insulin glargine (LANTUS) 100 UNIT/ML injection Inject 0.12 mLs (12 Units total) into the  skin every morning.  10 mL  11  . insulin lispro (HUMALOG) 100 UNIT/ML injection Inject 1-8 Units into the skin 3 (three) times daily with meals. Use moderate sliding Scale      . meloxicam (MOBIC) 7.5 MG tablet Take 7.5 mg by mouth as needed for pain.      Marland Kitchen omeprazole (PRILOSEC OTC) 20 MG tablet Take 20 mg by mouth every morning.      . silver sulfADIAZINE (SILVADENE) 1 % cream Apply 1 application topically daily.      . simvastatin (ZOCOR) 40 MG tablet Take 40 mg by mouth every evening.      . sodium polystyrene (KAYEXALATE) 15 GM/60ML suspension Take 15gm/31ml daily x 3 days only for elevated potassium.  180 mL  0  . triamcinolone cream (KENALOG) 0.1 % Apply 1 application topically daily.      Marland Kitchen warfarin (COUMADIN) 5 MG tablet Take 1 tablet (5 mg total) by mouth daily. For GOAL INR 2-3       No current facility-administered medications for this visit.    Past Medical History  Diagnosis Date  . DM2 (diabetes mellitus, type 2)   . COPD (chronic obstructive pulmonary disease)   . HTN (hypertension)   .  CHF (congestive heart failure)   . Shortness of breath   . Chronic kidney disease 04/2013     ACUTE   . Arthritis     HANDS  . Anemia     Past Surgical History  Procedure Laterality Date  . Vein surgery      History   Social History  . Marital Status: Divorced    Spouse Name: N/A    Number of Children: N/A  . Years of Education: N/A   Occupational History  . Not on file.   Social History Main Topics  . Smoking status: Former Smoker    Quit date: 02/21/1984  . Smokeless tobacco: Never Used  . Alcohol Use: No  . Drug Use: No  . Sexual Activity: Not on file   Other Topics Concern  . Not on file   Social History Narrative  . No narrative on file     Filed Vitals:   08/15/13 1413  Height: 4\' 11"  (1.499 m)   BP 126/53 Pulse 47  Wt 233 lbs  PHYSICAL EXAM General: NAD  Neck: No JVD, no thyromegaly.  Lungs: Clear but diminished to auscultation bilaterally  with diminished respiratory effort.  CV: Nondisplaced PMI. Regular rate and rhythm, normal S1/S2, no S3/S4, no murmur. 1+ pitting pretibial edema with bilateral erythemia. No carotid bruit.  Abdomen: Soft, obese, nontender, no distention.  Neurologic: Alert and oriented x 3.  Psych: Normal affect.  Extremities: No clubbing or cyanosis.   ECG: reviewed and available in electronic records.    ASSESSMENT AND PLAN: 1. Atrial fibrillation: Continue warfarin given CHADS-Vasc score of 5, thus high risk for stroke. HR is bradycardic (52 bpm) on current dose of diltiazem. I will switch long-acting diltiazem 120 mg daily to short acting 30 mg twice daily. 2. Chronic diastolic heart failure: Does have chronic lower extremity edema. Continue current dose of Lasix, 40 mg bid.  3. HTN: She has apparently been restarted on ACEI, lisinopril 10 mg daily. Will continue hydralazine 25 mg tid.   Dispo: f/u 6 months.    Kate Sable, M.D., F.A.C.C.

## 2013-08-15 NOTE — Patient Instructions (Addendum)
   Stop Diltiazem CD (long acting) 120mg   Change to short acting Diltiazem 30mg  twice a day   Continue all other medications.   Your physician wants you to follow up in: 6 months.  You will receive a reminder letter in the mail one-two months in advance.  If you don't receive a letter, please call our office to schedule the follow up appointment

## 2013-11-04 ENCOUNTER — Ambulatory Visit (INDEPENDENT_AMBULATORY_CARE_PROVIDER_SITE_OTHER): Payer: Medicare Other | Admitting: *Deleted

## 2013-11-04 DIAGNOSIS — Z5181 Encounter for therapeutic drug level monitoring: Secondary | ICD-10-CM

## 2013-11-04 DIAGNOSIS — I482 Chronic atrial fibrillation, unspecified: Secondary | ICD-10-CM

## 2013-11-04 DIAGNOSIS — I4891 Unspecified atrial fibrillation: Secondary | ICD-10-CM | POA: Insufficient documentation

## 2013-11-04 LAB — POCT INR: INR: 3.4

## 2013-11-05 ENCOUNTER — Telehealth: Payer: Self-pay | Admitting: *Deleted

## 2013-11-05 NOTE — Telephone Encounter (Signed)
Verbal okay given to change brand of warfarin.

## 2013-11-25 ENCOUNTER — Ambulatory Visit (INDEPENDENT_AMBULATORY_CARE_PROVIDER_SITE_OTHER): Payer: Medicare Other | Admitting: Pharmacist Clinician (PhC)/ Clinical Pharmacy Specialist

## 2013-11-25 DIAGNOSIS — I482 Chronic atrial fibrillation, unspecified: Secondary | ICD-10-CM

## 2013-11-25 DIAGNOSIS — I4891 Unspecified atrial fibrillation: Secondary | ICD-10-CM

## 2013-11-25 DIAGNOSIS — Z5181 Encounter for therapeutic drug level monitoring: Secondary | ICD-10-CM

## 2013-11-25 LAB — POCT INR: INR: 3

## 2013-12-16 ENCOUNTER — Ambulatory Visit (INDEPENDENT_AMBULATORY_CARE_PROVIDER_SITE_OTHER): Payer: Medicare Other | Admitting: *Deleted

## 2013-12-16 DIAGNOSIS — Z5181 Encounter for therapeutic drug level monitoring: Secondary | ICD-10-CM

## 2013-12-16 DIAGNOSIS — I4891 Unspecified atrial fibrillation: Secondary | ICD-10-CM

## 2013-12-16 LAB — POCT INR: INR: 3.7

## 2014-01-20 ENCOUNTER — Ambulatory Visit (INDEPENDENT_AMBULATORY_CARE_PROVIDER_SITE_OTHER): Payer: Medicare Other | Admitting: *Deleted

## 2014-01-20 DIAGNOSIS — Z5181 Encounter for therapeutic drug level monitoring: Secondary | ICD-10-CM

## 2014-01-20 DIAGNOSIS — I4891 Unspecified atrial fibrillation: Secondary | ICD-10-CM

## 2014-01-20 LAB — POCT INR: INR: 2.6

## 2014-02-17 ENCOUNTER — Ambulatory Visit (INDEPENDENT_AMBULATORY_CARE_PROVIDER_SITE_OTHER): Payer: Medicare Other | Admitting: *Deleted

## 2014-02-17 ENCOUNTER — Encounter: Payer: Self-pay | Admitting: Cardiovascular Disease

## 2014-02-17 ENCOUNTER — Ambulatory Visit (INDEPENDENT_AMBULATORY_CARE_PROVIDER_SITE_OTHER): Payer: Medicare Other | Admitting: Cardiovascular Disease

## 2014-02-17 VITALS — BP 149/56 | HR 68 | Ht 59.0 in | Wt 198.0 lb

## 2014-02-17 DIAGNOSIS — I10 Essential (primary) hypertension: Secondary | ICD-10-CM | POA: Diagnosis not present

## 2014-02-17 DIAGNOSIS — I5032 Chronic diastolic (congestive) heart failure: Secondary | ICD-10-CM

## 2014-02-17 DIAGNOSIS — J438 Other emphysema: Secondary | ICD-10-CM | POA: Diagnosis not present

## 2014-02-17 DIAGNOSIS — E669 Obesity, unspecified: Secondary | ICD-10-CM | POA: Diagnosis not present

## 2014-02-17 DIAGNOSIS — I4891 Unspecified atrial fibrillation: Secondary | ICD-10-CM

## 2014-02-17 DIAGNOSIS — Z5181 Encounter for therapeutic drug level monitoring: Secondary | ICD-10-CM | POA: Diagnosis not present

## 2014-02-17 LAB — POCT INR: INR: 1.2

## 2014-02-17 MED ORDER — LISINOPRIL 20 MG PO TABS: 20.0000 mg | ORAL_TABLET | Freq: Every day | ORAL | Status: DC

## 2014-02-17 NOTE — Progress Notes (Signed)
Patient ID: UMI FRUGE, female   DOB: [birth year], 73 y.o.   MRN: [id number]      SUBJECTIVE: The patient is here to followup for chronic diastolic heart failure, hypertension, and atrial fibrillation. An echocardiogram on 04/28/13 showed normal left ventricular systolic function, EF 0000000, grade 2 diastolic dysfunction, mild LVH, and mild left atrial enlargement. She has chronic leg swelling and dyspnea and uses 2 L of oxygen chronically. She feels very well and denies chest pain and palpitations, and says her breathing has improved. She now has 132 grandchildren.   Review of Systems: As per "subjective", otherwise negative.  No Known Allergies  Current Outpatient Prescriptions  Medication Sig Dispense Refill  . acetaminophen (TYLENOL) 500 MG tablet Take 1,000 mg by mouth 2 (two) times daily as needed for mild pain.    Marland Kitchen alendronate (FOSAMAX) 70 MG tablet Take 70 mg by mouth once a week. Take with a full glass of water on an empty stomach.    . benzonatate (TESSALON) 200 MG capsule Take 200 mg by mouth 3 (three) times daily as needed for cough.    . calcium-vitamin D (OSCAL WITH D) 500-200 MG-UNIT per tablet Take 1 tablet by mouth 2 (two) times daily.    . cycloSPORINE (RESTASIS) 0.05 % ophthalmic emulsion Place 1 drop into both eyes 2 (two) times daily.    Marland Kitchen diltiazem (CARDIZEM) 30 MG tablet Take 1 tablet (30 mg total) by mouth 2 (two) times daily.    . furosemide (LASIX) 40 MG tablet Take 40 mg by mouth 2 (two) times daily.    Marland Kitchen gabapentin (NEURONTIN) 300 MG capsule Take 300 mg by mouth 3 (three) times daily.    . hydrALAZINE (APRESOLINE) 25 MG tablet Take 1 tablet (25 mg total) by mouth 3 (three) times daily. 90 tablet 6  . insulin glargine (LANTUS) 100 UNIT/ML injection Inject 0.12 mLs (12 Units total) into the skin every morning. 10 mL 11  . insulin lispro (HUMALOG) 100 UNIT/ML injection Inject 10 Units into the skin daily. Use moderate sliding Scale    . lisinopril (PRINIVIL,ZESTRIL)  10 MG tablet Take 10 mg by mouth daily.    . Menthol-Zinc Oxide (CALMOSEPTINE) 0.44-20.6 % OINT Apply 1 application topically 2 (two) times daily.    Marland Kitchen omeprazole (PRILOSEC OTC) 20 MG tablet Take 20 mg by mouth every morning.    . silver sulfADIAZINE (SILVADENE) 1 % cream Apply 1 application topically 3 (three) times daily.     . simvastatin (ZOCOR) 40 MG tablet Take 40 mg by mouth every evening.    . triamcinolone cream (KENALOG) 0.1 % Apply 1 application topically daily.    Marland Kitchen warfarin (COUMADIN) 5 MG tablet Take 1 tablet (5 mg total) by mouth daily. For GOAL INR 2-3     No current facility-administered medications for this visit.    Past Medical History  Diagnosis Date  . DM2 (diabetes mellitus, type 2)   . COPD (chronic obstructive pulmonary disease)   . HTN (hypertension)   . CHF (congestive heart failure)   . Shortness of breath   . Chronic kidney disease 04/2013     ACUTE   . Arthritis     HANDS  . Anemia     Past Surgical History  Procedure Laterality Date  . Vein surgery      History   Social History  . Marital Status: Divorced    Spouse Name: N/A    Number of Children: N/A  . Years of Education:  N/A   Occupational History  . Not on file.   Social History Main Topics  . Smoking status: Former Smoker    Start date: 07/19/1955    Quit date: 02/21/1984  . Smokeless tobacco: Never Used  . Alcohol Use: No  . Drug Use: No  . Sexual Activity: No   Other Topics Concern  . Not on file   Social History Narrative     Filed Vitals:   02/17/14 1547  BP: 149/56  Pulse: 68  Height: 4\' 11"  (1.499 m)  Weight: 198 lb (89.812 kg)  SpO2: 97%    PHYSICAL EXAM General: NAD, using O2 by nasal cannula HEENT: Normal. Neck: No JVD, no thyromegaly. Lungs: Diminished b/l but clear, without rales or wheezes. CV: Nondisplaced PMI.  Regular rate and rhythm, normal S1/S2, no S3/S4, no murmur. Legs are bandaged with mild pretibial and periankle edema.   Abdomen: Soft,  obese.  Neurologic: Alert and oriented.  Psych: Normal affect. Skin: Normal. Musculoskeletal: No gross deformities. Extremities: No clubbing or cyanosis.   ECG: Most recent ECG reviewed.    ASSESSMENT AND PLAN: 1. Atrial fibrillation: Continue warfarin given CHADS-Vasc score of 5, thus high risk for stroke. HR is controlled on current dose of diltiazem. 2. Chronic diastolic heart failure: Does have chronic lower extremity edema and uses ACE bandages. Continue current dose of Lasix 40 mg bid. Given elevated BP, will increase lisinopril to 20 mg daily. 3. Essential HTN: Elevated today. Will increase lisinopril to 20 mg daily. Will continue hydralazine 25 mg tid.   Dispo: f/u 1 year.    Kate Sable, M.D., F.A.C.C.

## 2014-02-17 NOTE — Patient Instructions (Signed)
   Increase Lisinopril to 20mg  daily - new sent to pharm (may take 2 of your 10mg  tabs till finish current supply) Continue all other medications.   Your physician wants you to follow up in:  1 year.  You will receive a reminder letter in the mail one-two months in advance.  If you don't receive a letter, please call our office to schedule the follow up appointment

## 2014-03-03 ENCOUNTER — Ambulatory Visit (INDEPENDENT_AMBULATORY_CARE_PROVIDER_SITE_OTHER): Payer: Medicare Other | Admitting: *Deleted

## 2014-03-03 DIAGNOSIS — I4891 Unspecified atrial fibrillation: Secondary | ICD-10-CM

## 2014-03-03 DIAGNOSIS — Z5181 Encounter for therapeutic drug level monitoring: Secondary | ICD-10-CM

## 2014-03-03 LAB — POCT INR: INR: 1.5

## 2014-03-17 ENCOUNTER — Ambulatory Visit (INDEPENDENT_AMBULATORY_CARE_PROVIDER_SITE_OTHER): Payer: Medicare Other | Admitting: *Deleted

## 2014-03-17 DIAGNOSIS — Z5181 Encounter for therapeutic drug level monitoring: Secondary | ICD-10-CM

## 2014-03-17 DIAGNOSIS — I4891 Unspecified atrial fibrillation: Secondary | ICD-10-CM

## 2014-03-17 LAB — POCT INR: INR: 1.6

## 2014-04-28 ENCOUNTER — Ambulatory Visit (INDEPENDENT_AMBULATORY_CARE_PROVIDER_SITE_OTHER): Payer: Medicare Other | Admitting: *Deleted

## 2014-04-28 DIAGNOSIS — I480 Paroxysmal atrial fibrillation: Secondary | ICD-10-CM | POA: Diagnosis not present

## 2014-04-28 DIAGNOSIS — I4891 Unspecified atrial fibrillation: Secondary | ICD-10-CM | POA: Diagnosis not present

## 2014-04-28 DIAGNOSIS — Z5181 Encounter for therapeutic drug level monitoring: Secondary | ICD-10-CM | POA: Diagnosis not present

## 2014-04-28 LAB — POCT INR: INR: 4.7

## 2014-05-12 ENCOUNTER — Ambulatory Visit (INDEPENDENT_AMBULATORY_CARE_PROVIDER_SITE_OTHER): Payer: Medicare Other | Admitting: *Deleted

## 2014-05-12 DIAGNOSIS — Z5181 Encounter for therapeutic drug level monitoring: Secondary | ICD-10-CM | POA: Diagnosis not present

## 2014-05-12 DIAGNOSIS — I480 Paroxysmal atrial fibrillation: Secondary | ICD-10-CM

## 2014-05-12 DIAGNOSIS — I4891 Unspecified atrial fibrillation: Secondary | ICD-10-CM | POA: Diagnosis not present

## 2014-05-12 LAB — POCT INR: INR: 2.3

## 2014-05-19 ENCOUNTER — Telehealth: Payer: Self-pay | Admitting: *Deleted

## 2014-05-19 NOTE — Telephone Encounter (Signed)
[facility] informed [facility] that our office needed their NPI # in order to continue seeing this patient. Please contact Otila Kluver if anything else is needed.

## 2014-06-09 ENCOUNTER — Ambulatory Visit (INDEPENDENT_AMBULATORY_CARE_PROVIDER_SITE_OTHER): Payer: Medicare Other | Admitting: *Deleted

## 2014-06-09 DIAGNOSIS — Z5181 Encounter for therapeutic drug level monitoring: Secondary | ICD-10-CM | POA: Diagnosis not present

## 2014-06-09 DIAGNOSIS — I480 Paroxysmal atrial fibrillation: Secondary | ICD-10-CM | POA: Diagnosis not present

## 2014-06-09 DIAGNOSIS — I4891 Unspecified atrial fibrillation: Secondary | ICD-10-CM | POA: Diagnosis not present

## 2014-06-09 LAB — POCT INR: INR: 2.9

## 2014-07-07 ENCOUNTER — Ambulatory Visit (INDEPENDENT_AMBULATORY_CARE_PROVIDER_SITE_OTHER): Payer: Medicare Other | Admitting: *Deleted

## 2014-07-07 DIAGNOSIS — Z5181 Encounter for therapeutic drug level monitoring: Secondary | ICD-10-CM

## 2014-07-07 DIAGNOSIS — I4891 Unspecified atrial fibrillation: Secondary | ICD-10-CM | POA: Diagnosis not present

## 2014-07-07 LAB — POCT INR: INR: 2.5

## 2014-08-20 ENCOUNTER — Ambulatory Visit (INDEPENDENT_AMBULATORY_CARE_PROVIDER_SITE_OTHER): Payer: Medicare Other | Admitting: *Deleted

## 2014-08-20 DIAGNOSIS — Z5181 Encounter for therapeutic drug level monitoring: Secondary | ICD-10-CM

## 2014-08-20 DIAGNOSIS — I4891 Unspecified atrial fibrillation: Secondary | ICD-10-CM | POA: Diagnosis not present

## 2014-08-20 LAB — POCT INR: INR: 2.7

## 2014-10-01 ENCOUNTER — Ambulatory Visit (INDEPENDENT_AMBULATORY_CARE_PROVIDER_SITE_OTHER): Payer: Medicare Other | Admitting: *Deleted

## 2014-10-01 DIAGNOSIS — Z5181 Encounter for therapeutic drug level monitoring: Secondary | ICD-10-CM | POA: Diagnosis not present

## 2014-10-01 DIAGNOSIS — I4891 Unspecified atrial fibrillation: Secondary | ICD-10-CM | POA: Diagnosis not present

## 2014-10-01 LAB — POCT INR: INR: 1.8

## 2014-10-20 ENCOUNTER — Ambulatory Visit (INDEPENDENT_AMBULATORY_CARE_PROVIDER_SITE_OTHER): Payer: Medicare Other | Admitting: *Deleted

## 2014-10-20 DIAGNOSIS — I4891 Unspecified atrial fibrillation: Secondary | ICD-10-CM | POA: Diagnosis not present

## 2014-10-20 DIAGNOSIS — Z5181 Encounter for therapeutic drug level monitoring: Secondary | ICD-10-CM

## 2014-10-20 LAB — POCT INR: INR: 1.2

## 2014-10-27 ENCOUNTER — Ambulatory Visit (INDEPENDENT_AMBULATORY_CARE_PROVIDER_SITE_OTHER): Payer: Medicare Other | Admitting: *Deleted

## 2014-10-27 DIAGNOSIS — Z5181 Encounter for therapeutic drug level monitoring: Secondary | ICD-10-CM | POA: Diagnosis not present

## 2014-10-27 DIAGNOSIS — I4891 Unspecified atrial fibrillation: Secondary | ICD-10-CM

## 2014-10-27 LAB — POCT INR: INR: 1.4

## 2014-11-05 ENCOUNTER — Ambulatory Visit (INDEPENDENT_AMBULATORY_CARE_PROVIDER_SITE_OTHER): Payer: Medicare Other | Admitting: *Deleted

## 2014-11-05 DIAGNOSIS — Z5181 Encounter for therapeutic drug level monitoring: Secondary | ICD-10-CM | POA: Diagnosis not present

## 2014-11-05 DIAGNOSIS — I4891 Unspecified atrial fibrillation: Secondary | ICD-10-CM | POA: Diagnosis not present

## 2014-11-05 LAB — POCT INR: INR: 2.8

## 2014-11-26 ENCOUNTER — Ambulatory Visit (INDEPENDENT_AMBULATORY_CARE_PROVIDER_SITE_OTHER): Payer: Medicare Other | Admitting: *Deleted

## 2014-11-26 DIAGNOSIS — Z5181 Encounter for therapeutic drug level monitoring: Secondary | ICD-10-CM

## 2014-11-26 DIAGNOSIS — I4891 Unspecified atrial fibrillation: Secondary | ICD-10-CM | POA: Diagnosis not present

## 2014-11-26 LAB — POCT INR: INR: 3

## 2014-12-30 ENCOUNTER — Ambulatory Visit (INDEPENDENT_AMBULATORY_CARE_PROVIDER_SITE_OTHER): Payer: Medicare Other | Admitting: Cardiovascular Disease

## 2014-12-30 ENCOUNTER — Encounter: Payer: Self-pay | Admitting: Cardiovascular Disease

## 2014-12-30 VITALS — BP 129/76 | HR 60 | Ht 59.0 in | Wt 208.0 lb

## 2014-12-30 DIAGNOSIS — J438 Other emphysema: Secondary | ICD-10-CM

## 2014-12-30 DIAGNOSIS — I4891 Unspecified atrial fibrillation: Secondary | ICD-10-CM

## 2014-12-30 DIAGNOSIS — I5032 Chronic diastolic (congestive) heart failure: Secondary | ICD-10-CM | POA: Diagnosis not present

## 2014-12-30 DIAGNOSIS — I10 Essential (primary) hypertension: Secondary | ICD-10-CM

## 2014-12-30 DIAGNOSIS — Z713 Dietary counseling and surveillance: Secondary | ICD-10-CM

## 2014-12-30 NOTE — Patient Instructions (Signed)
Continue all current medications. Your physician wants you to follow up in:  1 year.  You will receive a reminder letter in the mail one-two months in advance.  If you don't receive a letter, please call our office to schedule the follow up appointment   

## 2014-12-30 NOTE — Progress Notes (Signed)
Patient ID: Haley Cooper, female   DOB: [birth year], 74 y.o.   MRN: [id number]      SUBJECTIVE: The patient is here to followup for chronic diastolic heart failure, hypertension, and atrial fibrillation. An echocardiogram on 04/28/13 showed normal left ventricular systolic function, EF 0000000, grade 2 diastolic dysfunction, mild LVH, and mild left atrial enlargement. She has chronic leg swelling and dyspnea and uses 2 L of oxygen chronically. She denies chest pain and palpitations. Consistently talking about how she fell and hurt her back over the summer. Here with a representative from her assisted living facility. She tells me her diet has been severely restricted.  ECG performed in the office today demonstrates sinus bradycardia, heart rate 58 bpm, with frequent PACs.  She has 132 grandchildren.   Review of Systems: As per "subjective", otherwise negative.  No Known Allergies  Current Outpatient Prescriptions  Medication Sig Dispense Refill  . atorvastatin (LIPITOR) 20 MG tablet Take 20 mg by mouth daily.    . furosemide (LASIX) 40 MG tablet Take 40 mg by mouth 2 (two) times daily.    Marland Kitchen gabapentin (NEURONTIN) 300 MG capsule Take 300 mg by mouth 3 (three) times daily.    . hydrALAZINE (APRESOLINE) 25 MG tablet Take 1 tablet (25 mg total) by mouth 3 (three) times daily. 90 tablet 6  . insulin glargine (LANTUS) 100 UNIT/ML injection Inject 0.12 mLs (12 Units total) into the skin every morning. 10 mL 11  . insulin lispro (HUMALOG) 100 UNIT/ML injection Inject 10 Units into the skin daily. Use moderate sliding Scale    . iron polysaccharides (NIFEREX) 150 MG capsule Take 150 mg by mouth daily.    Marland Kitchen lisinopril (PRINIVIL,ZESTRIL) 20 MG tablet Take 1 tablet (20 mg total) by mouth daily. 30 tablet 6  . warfarin (COUMADIN) 5 MG tablet Take 5 mg by mouth as directed. Take 7.5mg  daily     No current facility-administered medications for this visit.    Past Medical History  Diagnosis Date  .  DM2 (diabetes mellitus, type 2) ([place])   . COPD (chronic obstructive pulmonary disease) ([place])   . HTN (hypertension)   . CHF (congestive heart failure) ([place])   . Shortness of breath   . Chronic kidney disease 04/2013     ACUTE   . Arthritis     HANDS  . Anemia     Past Surgical History  Procedure Laterality Date  . Vein surgery      Social History   Social History  . Marital Status: Divorced    Spouse Name: N/A  . Number of Children: N/A  . Years of Education: N/A   Occupational History  . Not on file.   Social History Main Topics  . Smoking status: Former Smoker -- 1.00 packs/day for 29 years    Types: Cigarettes    Start date: 07/19/1955    Quit date: 02/21/1984  . Smokeless tobacco: Never Used  . Alcohol Use: No  . Drug Use: No  . Sexual Activity: No   Other Topics Concern  . Not on file   Social History Narrative     Filed Vitals:   12/30/14 1105  BP: 129/76  Pulse: 60  Height: 4\' 11"  (1.499 m)  Weight: 208 lb (94.348 kg)    PHYSICAL EXAM General: NAD, using O2 by nasal cannula HEENT: Normal. Neck: No JVD, no thyromegaly. Lungs: Diminished b/l but clear, without rales or wheezes. CV: Nondisplaced PMI. Regular rate and rhythm, normal S1/S2, no  S3/S4, no murmur. Legs are bandaged with mild pretibial and periankle edema.  Abdomen: Soft, obese.  Neurologic: Alert and oriented.  Psych: Normal affect. Skin: Normal. Musculoskeletal: No gross deformities. Extremities: No clubbing or cyanosis.   ECG: Most recent ECG reviewed.      ASSESSMENT AND PLAN: 1. Atrial fibrillation: Continue warfarin for now given CHADS-Vasc score of 5, thus high risk for stroke. Will obtain most recent BMET from PCP. Will consider switching from warfarin to target-specific anticoagulant so as to broaden her dietary options. No longer on diltiazem. Given HR of 58 bpm, would not reinstitute.  2. Chronic diastolic heart failure: Does have chronic lower extremity edema  and uses ACE bandages. Continue current dose of Lasix 40 mg bid.  Instructed on importance of low sodium diet.  3. Essential HTN: Controlled. No changes to therapy.  Dispo: f/u 1 year.    Kate Sable, M.D., F.A.C.C.

## 2014-12-31 ENCOUNTER — Telehealth: Payer: Self-pay | Admitting: Internal Medicine

## 2014-12-31 ENCOUNTER — Encounter: Payer: Self-pay | Admitting: *Deleted

## 2014-12-31 NOTE — Telephone Encounter (Signed)
ERROR  WRONG PATIENT. PLEASE DISREGARD

## 2015-01-28 ENCOUNTER — Encounter: Payer: Self-pay | Admitting: *Deleted

## 2015-02-05 ENCOUNTER — Other Ambulatory Visit: Payer: Self-pay | Admitting: *Deleted

## 2015-02-05 MED ORDER — WARFARIN SODIUM 5 MG PO TABS: 5.0000 mg | ORAL_TABLET | ORAL | Status: DC

## 2015-02-07 DIAGNOSIS — Z87891 Personal history of nicotine dependence: Secondary | ICD-10-CM | POA: Diagnosis not present

## 2015-02-07 DIAGNOSIS — Z7901 Long term (current) use of anticoagulants: Secondary | ICD-10-CM | POA: Diagnosis not present

## 2015-02-07 DIAGNOSIS — N189 Chronic kidney disease, unspecified: Secondary | ICD-10-CM | POA: Diagnosis not present

## 2015-02-07 DIAGNOSIS — J449 Chronic obstructive pulmonary disease, unspecified: Secondary | ICD-10-CM | POA: Diagnosis not present

## 2015-02-07 DIAGNOSIS — I129 Hypertensive chronic kidney disease with stage 1 through stage 4 chronic kidney disease, or unspecified chronic kidney disease: Secondary | ICD-10-CM | POA: Diagnosis not present

## 2015-02-07 DIAGNOSIS — E1165 Type 2 diabetes mellitus with hyperglycemia: Secondary | ICD-10-CM | POA: Diagnosis not present

## 2015-02-07 DIAGNOSIS — Z794 Long term (current) use of insulin: Secondary | ICD-10-CM | POA: Diagnosis not present

## 2015-02-07 DIAGNOSIS — I872 Venous insufficiency (chronic) (peripheral): Secondary | ICD-10-CM | POA: Diagnosis not present

## 2015-02-07 DIAGNOSIS — Z9981 Dependence on supplemental oxygen: Secondary | ICD-10-CM | POA: Diagnosis not present

## 2015-02-07 DIAGNOSIS — I5032 Chronic diastolic (congestive) heart failure: Secondary | ICD-10-CM | POA: Diagnosis not present

## 2015-02-09 DIAGNOSIS — I872 Venous insufficiency (chronic) (peripheral): Secondary | ICD-10-CM | POA: Diagnosis not present

## 2015-02-09 DIAGNOSIS — E1165 Type 2 diabetes mellitus with hyperglycemia: Secondary | ICD-10-CM | POA: Diagnosis not present

## 2015-02-09 DIAGNOSIS — I5032 Chronic diastolic (congestive) heart failure: Secondary | ICD-10-CM | POA: Diagnosis not present

## 2015-02-09 DIAGNOSIS — I129 Hypertensive chronic kidney disease with stage 1 through stage 4 chronic kidney disease, or unspecified chronic kidney disease: Secondary | ICD-10-CM | POA: Diagnosis not present

## 2015-02-09 DIAGNOSIS — N189 Chronic kidney disease, unspecified: Secondary | ICD-10-CM | POA: Diagnosis not present

## 2015-02-09 DIAGNOSIS — J449 Chronic obstructive pulmonary disease, unspecified: Secondary | ICD-10-CM | POA: Diagnosis not present

## 2015-02-12 DIAGNOSIS — E1165 Type 2 diabetes mellitus with hyperglycemia: Secondary | ICD-10-CM | POA: Diagnosis not present

## 2015-02-12 DIAGNOSIS — N189 Chronic kidney disease, unspecified: Secondary | ICD-10-CM | POA: Diagnosis not present

## 2015-02-12 DIAGNOSIS — I5032 Chronic diastolic (congestive) heart failure: Secondary | ICD-10-CM | POA: Diagnosis not present

## 2015-02-12 DIAGNOSIS — I872 Venous insufficiency (chronic) (peripheral): Secondary | ICD-10-CM | POA: Diagnosis not present

## 2015-02-12 DIAGNOSIS — I129 Hypertensive chronic kidney disease with stage 1 through stage 4 chronic kidney disease, or unspecified chronic kidney disease: Secondary | ICD-10-CM | POA: Diagnosis not present

## 2015-02-12 DIAGNOSIS — J449 Chronic obstructive pulmonary disease, unspecified: Secondary | ICD-10-CM | POA: Diagnosis not present

## 2015-02-13 DIAGNOSIS — E1165 Type 2 diabetes mellitus with hyperglycemia: Secondary | ICD-10-CM | POA: Diagnosis not present

## 2015-02-13 DIAGNOSIS — I872 Venous insufficiency (chronic) (peripheral): Secondary | ICD-10-CM | POA: Diagnosis not present

## 2015-02-13 DIAGNOSIS — J449 Chronic obstructive pulmonary disease, unspecified: Secondary | ICD-10-CM | POA: Diagnosis not present

## 2015-02-13 DIAGNOSIS — I129 Hypertensive chronic kidney disease with stage 1 through stage 4 chronic kidney disease, or unspecified chronic kidney disease: Secondary | ICD-10-CM | POA: Diagnosis not present

## 2015-02-13 DIAGNOSIS — I5032 Chronic diastolic (congestive) heart failure: Secondary | ICD-10-CM | POA: Diagnosis not present

## 2015-02-13 DIAGNOSIS — N189 Chronic kidney disease, unspecified: Secondary | ICD-10-CM | POA: Diagnosis not present

## 2015-02-15 DIAGNOSIS — N189 Chronic kidney disease, unspecified: Secondary | ICD-10-CM | POA: Diagnosis not present

## 2015-02-15 DIAGNOSIS — I872 Venous insufficiency (chronic) (peripheral): Secondary | ICD-10-CM | POA: Diagnosis not present

## 2015-02-15 DIAGNOSIS — I129 Hypertensive chronic kidney disease with stage 1 through stage 4 chronic kidney disease, or unspecified chronic kidney disease: Secondary | ICD-10-CM | POA: Diagnosis not present

## 2015-02-15 DIAGNOSIS — J449 Chronic obstructive pulmonary disease, unspecified: Secondary | ICD-10-CM | POA: Diagnosis not present

## 2015-02-15 DIAGNOSIS — I5032 Chronic diastolic (congestive) heart failure: Secondary | ICD-10-CM | POA: Diagnosis not present

## 2015-02-15 DIAGNOSIS — E1165 Type 2 diabetes mellitus with hyperglycemia: Secondary | ICD-10-CM | POA: Diagnosis not present

## 2015-02-17 ENCOUNTER — Telehealth: Payer: Self-pay | Admitting: *Deleted

## 2015-02-17 DIAGNOSIS — E1165 Type 2 diabetes mellitus with hyperglycemia: Secondary | ICD-10-CM | POA: Diagnosis not present

## 2015-02-17 DIAGNOSIS — I129 Hypertensive chronic kidney disease with stage 1 through stage 4 chronic kidney disease, or unspecified chronic kidney disease: Secondary | ICD-10-CM | POA: Diagnosis not present

## 2015-02-17 DIAGNOSIS — I872 Venous insufficiency (chronic) (peripheral): Secondary | ICD-10-CM | POA: Diagnosis not present

## 2015-02-17 DIAGNOSIS — N189 Chronic kidney disease, unspecified: Secondary | ICD-10-CM | POA: Diagnosis not present

## 2015-02-17 DIAGNOSIS — J449 Chronic obstructive pulmonary disease, unspecified: Secondary | ICD-10-CM | POA: Diagnosis not present

## 2015-02-17 DIAGNOSIS — I5032 Chronic diastolic (congestive) heart failure: Secondary | ICD-10-CM | POA: Diagnosis not present

## 2015-02-17 NOTE — Telephone Encounter (Signed)
Patient last seen 01/09/2015 - discussion was had over possibly switching from Warfarin to target-specific anticoagulant so as to broaden her dietary options.  Most recent BMET was obtained from Dr. Lowanda Foster & reviewed by Dr. Bronson Ing.  Decision was made for patient to remain on the Warfarin.    Dessie with [facility] where patient lives notified.  Patient has INR check scheduled for tomorrow at 9:00 here in the [place] office.

## 2015-02-18 ENCOUNTER — Ambulatory Visit (INDEPENDENT_AMBULATORY_CARE_PROVIDER_SITE_OTHER): Payer: Medicare Other | Admitting: *Deleted

## 2015-02-18 DIAGNOSIS — I4891 Unspecified atrial fibrillation: Secondary | ICD-10-CM | POA: Diagnosis not present

## 2015-02-18 DIAGNOSIS — Z5181 Encounter for therapeutic drug level monitoring: Secondary | ICD-10-CM

## 2015-02-18 LAB — POCT INR: INR: 1.2

## 2015-02-18 MED ORDER — WARFARIN SODIUM 7.5 MG PO TABS: ORAL_TABLET | ORAL | Status: DC

## 2015-02-18 MED ORDER — WARFARIN SODIUM 5 MG PO TABS: ORAL_TABLET | ORAL | Status: DC

## 2015-02-19 DIAGNOSIS — J449 Chronic obstructive pulmonary disease, unspecified: Secondary | ICD-10-CM | POA: Diagnosis not present

## 2015-02-19 DIAGNOSIS — I5032 Chronic diastolic (congestive) heart failure: Secondary | ICD-10-CM | POA: Diagnosis not present

## 2015-02-19 DIAGNOSIS — E1165 Type 2 diabetes mellitus with hyperglycemia: Secondary | ICD-10-CM | POA: Diagnosis not present

## 2015-02-19 DIAGNOSIS — N189 Chronic kidney disease, unspecified: Secondary | ICD-10-CM | POA: Diagnosis not present

## 2015-02-19 DIAGNOSIS — I872 Venous insufficiency (chronic) (peripheral): Secondary | ICD-10-CM | POA: Diagnosis not present

## 2015-02-19 DIAGNOSIS — I129 Hypertensive chronic kidney disease with stage 1 through stage 4 chronic kidney disease, or unspecified chronic kidney disease: Secondary | ICD-10-CM | POA: Diagnosis not present

## 2015-02-22 DIAGNOSIS — I872 Venous insufficiency (chronic) (peripheral): Secondary | ICD-10-CM | POA: Diagnosis not present

## 2015-02-22 DIAGNOSIS — E1165 Type 2 diabetes mellitus with hyperglycemia: Secondary | ICD-10-CM | POA: Diagnosis not present

## 2015-02-22 DIAGNOSIS — N189 Chronic kidney disease, unspecified: Secondary | ICD-10-CM | POA: Diagnosis not present

## 2015-02-22 DIAGNOSIS — I5032 Chronic diastolic (congestive) heart failure: Secondary | ICD-10-CM | POA: Diagnosis not present

## 2015-02-22 DIAGNOSIS — I129 Hypertensive chronic kidney disease with stage 1 through stage 4 chronic kidney disease, or unspecified chronic kidney disease: Secondary | ICD-10-CM | POA: Diagnosis not present

## 2015-02-22 DIAGNOSIS — J449 Chronic obstructive pulmonary disease, unspecified: Secondary | ICD-10-CM | POA: Diagnosis not present

## 2015-02-24 DIAGNOSIS — I872 Venous insufficiency (chronic) (peripheral): Secondary | ICD-10-CM | POA: Diagnosis not present

## 2015-02-24 DIAGNOSIS — I129 Hypertensive chronic kidney disease with stage 1 through stage 4 chronic kidney disease, or unspecified chronic kidney disease: Secondary | ICD-10-CM | POA: Diagnosis not present

## 2015-02-24 DIAGNOSIS — E1165 Type 2 diabetes mellitus with hyperglycemia: Secondary | ICD-10-CM | POA: Diagnosis not present

## 2015-02-24 DIAGNOSIS — J449 Chronic obstructive pulmonary disease, unspecified: Secondary | ICD-10-CM | POA: Diagnosis not present

## 2015-02-24 DIAGNOSIS — N189 Chronic kidney disease, unspecified: Secondary | ICD-10-CM | POA: Diagnosis not present

## 2015-02-24 DIAGNOSIS — I5032 Chronic diastolic (congestive) heart failure: Secondary | ICD-10-CM | POA: Diagnosis not present

## 2015-02-25 ENCOUNTER — Ambulatory Visit (INDEPENDENT_AMBULATORY_CARE_PROVIDER_SITE_OTHER): Payer: Medicare Other | Admitting: *Deleted

## 2015-02-25 DIAGNOSIS — I4891 Unspecified atrial fibrillation: Secondary | ICD-10-CM | POA: Diagnosis not present

## 2015-02-25 DIAGNOSIS — Z5181 Encounter for therapeutic drug level monitoring: Secondary | ICD-10-CM | POA: Diagnosis not present

## 2015-02-25 LAB — POCT INR: INR: 1.5

## 2015-02-26 DIAGNOSIS — N189 Chronic kidney disease, unspecified: Secondary | ICD-10-CM | POA: Diagnosis not present

## 2015-02-26 DIAGNOSIS — I5032 Chronic diastolic (congestive) heart failure: Secondary | ICD-10-CM | POA: Diagnosis not present

## 2015-02-26 DIAGNOSIS — I129 Hypertensive chronic kidney disease with stage 1 through stage 4 chronic kidney disease, or unspecified chronic kidney disease: Secondary | ICD-10-CM | POA: Diagnosis not present

## 2015-02-26 DIAGNOSIS — E1165 Type 2 diabetes mellitus with hyperglycemia: Secondary | ICD-10-CM | POA: Diagnosis not present

## 2015-02-26 DIAGNOSIS — I872 Venous insufficiency (chronic) (peripheral): Secondary | ICD-10-CM | POA: Diagnosis not present

## 2015-02-26 DIAGNOSIS — J449 Chronic obstructive pulmonary disease, unspecified: Secondary | ICD-10-CM | POA: Diagnosis not present

## 2015-03-01 DIAGNOSIS — J449 Chronic obstructive pulmonary disease, unspecified: Secondary | ICD-10-CM | POA: Diagnosis not present

## 2015-03-01 DIAGNOSIS — I129 Hypertensive chronic kidney disease with stage 1 through stage 4 chronic kidney disease, or unspecified chronic kidney disease: Secondary | ICD-10-CM | POA: Diagnosis not present

## 2015-03-01 DIAGNOSIS — I872 Venous insufficiency (chronic) (peripheral): Secondary | ICD-10-CM | POA: Diagnosis not present

## 2015-03-01 DIAGNOSIS — I5032 Chronic diastolic (congestive) heart failure: Secondary | ICD-10-CM | POA: Diagnosis not present

## 2015-03-01 DIAGNOSIS — N189 Chronic kidney disease, unspecified: Secondary | ICD-10-CM | POA: Diagnosis not present

## 2015-03-01 DIAGNOSIS — E1165 Type 2 diabetes mellitus with hyperglycemia: Secondary | ICD-10-CM | POA: Diagnosis not present

## 2015-03-03 DIAGNOSIS — J449 Chronic obstructive pulmonary disease, unspecified: Secondary | ICD-10-CM | POA: Diagnosis not present

## 2015-03-03 DIAGNOSIS — E1165 Type 2 diabetes mellitus with hyperglycemia: Secondary | ICD-10-CM | POA: Diagnosis not present

## 2015-03-03 DIAGNOSIS — I872 Venous insufficiency (chronic) (peripheral): Secondary | ICD-10-CM | POA: Diagnosis not present

## 2015-03-03 DIAGNOSIS — I129 Hypertensive chronic kidney disease with stage 1 through stage 4 chronic kidney disease, or unspecified chronic kidney disease: Secondary | ICD-10-CM | POA: Diagnosis not present

## 2015-03-03 DIAGNOSIS — N189 Chronic kidney disease, unspecified: Secondary | ICD-10-CM | POA: Diagnosis not present

## 2015-03-03 DIAGNOSIS — I5032 Chronic diastolic (congestive) heart failure: Secondary | ICD-10-CM | POA: Diagnosis not present

## 2015-03-05 DIAGNOSIS — I872 Venous insufficiency (chronic) (peripheral): Secondary | ICD-10-CM | POA: Diagnosis not present

## 2015-03-05 DIAGNOSIS — J449 Chronic obstructive pulmonary disease, unspecified: Secondary | ICD-10-CM | POA: Diagnosis not present

## 2015-03-05 DIAGNOSIS — N189 Chronic kidney disease, unspecified: Secondary | ICD-10-CM | POA: Diagnosis not present

## 2015-03-05 DIAGNOSIS — I5032 Chronic diastolic (congestive) heart failure: Secondary | ICD-10-CM | POA: Diagnosis not present

## 2015-03-05 DIAGNOSIS — E1165 Type 2 diabetes mellitus with hyperglycemia: Secondary | ICD-10-CM | POA: Diagnosis not present

## 2015-03-05 DIAGNOSIS — I129 Hypertensive chronic kidney disease with stage 1 through stage 4 chronic kidney disease, or unspecified chronic kidney disease: Secondary | ICD-10-CM | POA: Diagnosis not present

## 2015-03-08 DIAGNOSIS — I872 Venous insufficiency (chronic) (peripheral): Secondary | ICD-10-CM | POA: Diagnosis not present

## 2015-03-08 DIAGNOSIS — J449 Chronic obstructive pulmonary disease, unspecified: Secondary | ICD-10-CM | POA: Diagnosis not present

## 2015-03-08 DIAGNOSIS — I5032 Chronic diastolic (congestive) heart failure: Secondary | ICD-10-CM | POA: Diagnosis not present

## 2015-03-08 DIAGNOSIS — N189 Chronic kidney disease, unspecified: Secondary | ICD-10-CM | POA: Diagnosis not present

## 2015-03-08 DIAGNOSIS — E1165 Type 2 diabetes mellitus with hyperglycemia: Secondary | ICD-10-CM | POA: Diagnosis not present

## 2015-03-08 DIAGNOSIS — I129 Hypertensive chronic kidney disease with stage 1 through stage 4 chronic kidney disease, or unspecified chronic kidney disease: Secondary | ICD-10-CM | POA: Diagnosis not present

## 2015-03-09 ENCOUNTER — Ambulatory Visit (INDEPENDENT_AMBULATORY_CARE_PROVIDER_SITE_OTHER): Payer: Medicare Other | Admitting: Pharmacist

## 2015-03-09 DIAGNOSIS — I4891 Unspecified atrial fibrillation: Secondary | ICD-10-CM

## 2015-03-09 DIAGNOSIS — Z5181 Encounter for therapeutic drug level monitoring: Secondary | ICD-10-CM

## 2015-03-09 LAB — POCT INR: INR: 1.9

## 2015-03-09 MED ORDER — WARFARIN SODIUM 7.5 MG PO TABS: ORAL_TABLET | ORAL | Status: DC

## 2015-03-09 NOTE — Addendum Note (Signed)
Addended by: Elberta Leatherwood R on: 03/09/2015 03:30 PM   Modules accepted: Orders

## 2015-03-10 ENCOUNTER — Telehealth: Payer: Self-pay | Admitting: Cardiovascular Disease

## 2015-03-10 DIAGNOSIS — J449 Chronic obstructive pulmonary disease, unspecified: Secondary | ICD-10-CM | POA: Diagnosis not present

## 2015-03-10 DIAGNOSIS — N189 Chronic kidney disease, unspecified: Secondary | ICD-10-CM | POA: Diagnosis not present

## 2015-03-10 DIAGNOSIS — I5032 Chronic diastolic (congestive) heart failure: Secondary | ICD-10-CM | POA: Diagnosis not present

## 2015-03-10 DIAGNOSIS — I129 Hypertensive chronic kidney disease with stage 1 through stage 4 chronic kidney disease, or unspecified chronic kidney disease: Secondary | ICD-10-CM | POA: Diagnosis not present

## 2015-03-10 DIAGNOSIS — I872 Venous insufficiency (chronic) (peripheral): Secondary | ICD-10-CM | POA: Diagnosis not present

## 2015-03-10 DIAGNOSIS — E1165 Type 2 diabetes mellitus with hyperglycemia: Secondary | ICD-10-CM | POA: Diagnosis not present

## 2015-03-10 NOTE — Telephone Encounter (Signed)
Returned call to Manuela Schwartz at [facility]. She is requesting that we send new coumadin dose directly to the [facility] pharmacy [facility]) at [phone]. They package her medications for the week and need to see dosing changes each time. Order sent for current dose today.

## 2015-03-10 NOTE — Telephone Encounter (Signed)
Manuela Schwartz with [facility] needs someone to call them in reference to coumadin changes and  getting new orders written for their [facility]

## 2015-03-11 DIAGNOSIS — I509 Heart failure, unspecified: Secondary | ICD-10-CM | POA: Diagnosis not present

## 2015-03-11 DIAGNOSIS — N19 Unspecified kidney failure: Secondary | ICD-10-CM | POA: Diagnosis not present

## 2015-03-11 DIAGNOSIS — L97909 Non-pressure chronic ulcer of unspecified part of unspecified lower leg with unspecified severity: Secondary | ICD-10-CM | POA: Diagnosis not present

## 2015-03-11 DIAGNOSIS — E11622 Type 2 diabetes mellitus with other skin ulcer: Secondary | ICD-10-CM | POA: Diagnosis not present

## 2015-03-12 DIAGNOSIS — I129 Hypertensive chronic kidney disease with stage 1 through stage 4 chronic kidney disease, or unspecified chronic kidney disease: Secondary | ICD-10-CM | POA: Diagnosis not present

## 2015-03-12 DIAGNOSIS — J449 Chronic obstructive pulmonary disease, unspecified: Secondary | ICD-10-CM | POA: Diagnosis not present

## 2015-03-12 DIAGNOSIS — I5032 Chronic diastolic (congestive) heart failure: Secondary | ICD-10-CM | POA: Diagnosis not present

## 2015-03-12 DIAGNOSIS — E1165 Type 2 diabetes mellitus with hyperglycemia: Secondary | ICD-10-CM | POA: Diagnosis not present

## 2015-03-12 DIAGNOSIS — I872 Venous insufficiency (chronic) (peripheral): Secondary | ICD-10-CM | POA: Diagnosis not present

## 2015-03-12 DIAGNOSIS — N189 Chronic kidney disease, unspecified: Secondary | ICD-10-CM | POA: Diagnosis not present

## 2015-03-15 DIAGNOSIS — I5032 Chronic diastolic (congestive) heart failure: Secondary | ICD-10-CM | POA: Diagnosis not present

## 2015-03-15 DIAGNOSIS — N189 Chronic kidney disease, unspecified: Secondary | ICD-10-CM | POA: Diagnosis not present

## 2015-03-15 DIAGNOSIS — I872 Venous insufficiency (chronic) (peripheral): Secondary | ICD-10-CM | POA: Diagnosis not present

## 2015-03-15 DIAGNOSIS — E1165 Type 2 diabetes mellitus with hyperglycemia: Secondary | ICD-10-CM | POA: Diagnosis not present

## 2015-03-15 DIAGNOSIS — I129 Hypertensive chronic kidney disease with stage 1 through stage 4 chronic kidney disease, or unspecified chronic kidney disease: Secondary | ICD-10-CM | POA: Diagnosis not present

## 2015-03-15 DIAGNOSIS — J449 Chronic obstructive pulmonary disease, unspecified: Secondary | ICD-10-CM | POA: Diagnosis not present

## 2015-03-16 DIAGNOSIS — E1140 Type 2 diabetes mellitus with diabetic neuropathy, unspecified: Secondary | ICD-10-CM | POA: Diagnosis not present

## 2015-03-16 DIAGNOSIS — E1151 Type 2 diabetes mellitus with diabetic peripheral angiopathy without gangrene: Secondary | ICD-10-CM | POA: Diagnosis not present

## 2015-03-17 DIAGNOSIS — J449 Chronic obstructive pulmonary disease, unspecified: Secondary | ICD-10-CM | POA: Diagnosis not present

## 2015-03-17 DIAGNOSIS — N189 Chronic kidney disease, unspecified: Secondary | ICD-10-CM | POA: Diagnosis not present

## 2015-03-17 DIAGNOSIS — I5032 Chronic diastolic (congestive) heart failure: Secondary | ICD-10-CM | POA: Diagnosis not present

## 2015-03-17 DIAGNOSIS — I129 Hypertensive chronic kidney disease with stage 1 through stage 4 chronic kidney disease, or unspecified chronic kidney disease: Secondary | ICD-10-CM | POA: Diagnosis not present

## 2015-03-17 DIAGNOSIS — E1165 Type 2 diabetes mellitus with hyperglycemia: Secondary | ICD-10-CM | POA: Diagnosis not present

## 2015-03-17 DIAGNOSIS — I872 Venous insufficiency (chronic) (peripheral): Secondary | ICD-10-CM | POA: Diagnosis not present

## 2015-03-19 DIAGNOSIS — I5032 Chronic diastolic (congestive) heart failure: Secondary | ICD-10-CM | POA: Diagnosis not present

## 2015-03-19 DIAGNOSIS — N189 Chronic kidney disease, unspecified: Secondary | ICD-10-CM | POA: Diagnosis not present

## 2015-03-19 DIAGNOSIS — E1165 Type 2 diabetes mellitus with hyperglycemia: Secondary | ICD-10-CM | POA: Diagnosis not present

## 2015-03-19 DIAGNOSIS — I129 Hypertensive chronic kidney disease with stage 1 through stage 4 chronic kidney disease, or unspecified chronic kidney disease: Secondary | ICD-10-CM | POA: Diagnosis not present

## 2015-03-19 DIAGNOSIS — J449 Chronic obstructive pulmonary disease, unspecified: Secondary | ICD-10-CM | POA: Diagnosis not present

## 2015-03-19 DIAGNOSIS — I872 Venous insufficiency (chronic) (peripheral): Secondary | ICD-10-CM | POA: Diagnosis not present

## 2015-03-22 DIAGNOSIS — I872 Venous insufficiency (chronic) (peripheral): Secondary | ICD-10-CM | POA: Diagnosis not present

## 2015-03-22 DIAGNOSIS — N189 Chronic kidney disease, unspecified: Secondary | ICD-10-CM | POA: Diagnosis not present

## 2015-03-22 DIAGNOSIS — I5032 Chronic diastolic (congestive) heart failure: Secondary | ICD-10-CM | POA: Diagnosis not present

## 2015-03-22 DIAGNOSIS — E1165 Type 2 diabetes mellitus with hyperglycemia: Secondary | ICD-10-CM | POA: Diagnosis not present

## 2015-03-22 DIAGNOSIS — I129 Hypertensive chronic kidney disease with stage 1 through stage 4 chronic kidney disease, or unspecified chronic kidney disease: Secondary | ICD-10-CM | POA: Diagnosis not present

## 2015-03-22 DIAGNOSIS — J449 Chronic obstructive pulmonary disease, unspecified: Secondary | ICD-10-CM | POA: Diagnosis not present

## 2015-03-24 DIAGNOSIS — I5032 Chronic diastolic (congestive) heart failure: Secondary | ICD-10-CM | POA: Diagnosis not present

## 2015-03-24 DIAGNOSIS — I872 Venous insufficiency (chronic) (peripheral): Secondary | ICD-10-CM | POA: Diagnosis not present

## 2015-03-24 DIAGNOSIS — N189 Chronic kidney disease, unspecified: Secondary | ICD-10-CM | POA: Diagnosis not present

## 2015-03-24 DIAGNOSIS — I129 Hypertensive chronic kidney disease with stage 1 through stage 4 chronic kidney disease, or unspecified chronic kidney disease: Secondary | ICD-10-CM | POA: Diagnosis not present

## 2015-03-24 DIAGNOSIS — E1165 Type 2 diabetes mellitus with hyperglycemia: Secondary | ICD-10-CM | POA: Diagnosis not present

## 2015-03-24 DIAGNOSIS — J449 Chronic obstructive pulmonary disease, unspecified: Secondary | ICD-10-CM | POA: Diagnosis not present

## 2015-03-26 DIAGNOSIS — E1165 Type 2 diabetes mellitus with hyperglycemia: Secondary | ICD-10-CM | POA: Diagnosis not present

## 2015-03-26 DIAGNOSIS — I5032 Chronic diastolic (congestive) heart failure: Secondary | ICD-10-CM | POA: Diagnosis not present

## 2015-03-26 DIAGNOSIS — J449 Chronic obstructive pulmonary disease, unspecified: Secondary | ICD-10-CM | POA: Diagnosis not present

## 2015-03-26 DIAGNOSIS — I872 Venous insufficiency (chronic) (peripheral): Secondary | ICD-10-CM | POA: Diagnosis not present

## 2015-03-26 DIAGNOSIS — I129 Hypertensive chronic kidney disease with stage 1 through stage 4 chronic kidney disease, or unspecified chronic kidney disease: Secondary | ICD-10-CM | POA: Diagnosis not present

## 2015-03-26 DIAGNOSIS — N189 Chronic kidney disease, unspecified: Secondary | ICD-10-CM | POA: Diagnosis not present

## 2015-03-29 DIAGNOSIS — J449 Chronic obstructive pulmonary disease, unspecified: Secondary | ICD-10-CM | POA: Diagnosis not present

## 2015-03-29 DIAGNOSIS — N189 Chronic kidney disease, unspecified: Secondary | ICD-10-CM | POA: Diagnosis not present

## 2015-03-29 DIAGNOSIS — I872 Venous insufficiency (chronic) (peripheral): Secondary | ICD-10-CM | POA: Diagnosis not present

## 2015-03-29 DIAGNOSIS — E1165 Type 2 diabetes mellitus with hyperglycemia: Secondary | ICD-10-CM | POA: Diagnosis not present

## 2015-03-29 DIAGNOSIS — I5032 Chronic diastolic (congestive) heart failure: Secondary | ICD-10-CM | POA: Diagnosis not present

## 2015-03-29 DIAGNOSIS — I129 Hypertensive chronic kidney disease with stage 1 through stage 4 chronic kidney disease, or unspecified chronic kidney disease: Secondary | ICD-10-CM | POA: Diagnosis not present

## 2015-03-31 DIAGNOSIS — E1165 Type 2 diabetes mellitus with hyperglycemia: Secondary | ICD-10-CM | POA: Diagnosis not present

## 2015-03-31 DIAGNOSIS — N189 Chronic kidney disease, unspecified: Secondary | ICD-10-CM | POA: Diagnosis not present

## 2015-03-31 DIAGNOSIS — I5032 Chronic diastolic (congestive) heart failure: Secondary | ICD-10-CM | POA: Diagnosis not present

## 2015-03-31 DIAGNOSIS — I129 Hypertensive chronic kidney disease with stage 1 through stage 4 chronic kidney disease, or unspecified chronic kidney disease: Secondary | ICD-10-CM | POA: Diagnosis not present

## 2015-03-31 DIAGNOSIS — I872 Venous insufficiency (chronic) (peripheral): Secondary | ICD-10-CM | POA: Diagnosis not present

## 2015-03-31 DIAGNOSIS — J449 Chronic obstructive pulmonary disease, unspecified: Secondary | ICD-10-CM | POA: Diagnosis not present

## 2015-04-02 DIAGNOSIS — E1165 Type 2 diabetes mellitus with hyperglycemia: Secondary | ICD-10-CM | POA: Diagnosis not present

## 2015-04-02 DIAGNOSIS — I5032 Chronic diastolic (congestive) heart failure: Secondary | ICD-10-CM | POA: Diagnosis not present

## 2015-04-02 DIAGNOSIS — I129 Hypertensive chronic kidney disease with stage 1 through stage 4 chronic kidney disease, or unspecified chronic kidney disease: Secondary | ICD-10-CM | POA: Diagnosis not present

## 2015-04-02 DIAGNOSIS — N189 Chronic kidney disease, unspecified: Secondary | ICD-10-CM | POA: Diagnosis not present

## 2015-04-02 DIAGNOSIS — J449 Chronic obstructive pulmonary disease, unspecified: Secondary | ICD-10-CM | POA: Diagnosis not present

## 2015-04-02 DIAGNOSIS — I872 Venous insufficiency (chronic) (peripheral): Secondary | ICD-10-CM | POA: Diagnosis not present

## 2015-04-04 DIAGNOSIS — I129 Hypertensive chronic kidney disease with stage 1 through stage 4 chronic kidney disease, or unspecified chronic kidney disease: Secondary | ICD-10-CM | POA: Diagnosis not present

## 2015-04-04 DIAGNOSIS — I5032 Chronic diastolic (congestive) heart failure: Secondary | ICD-10-CM | POA: Diagnosis not present

## 2015-04-04 DIAGNOSIS — N189 Chronic kidney disease, unspecified: Secondary | ICD-10-CM | POA: Diagnosis not present

## 2015-04-04 DIAGNOSIS — I872 Venous insufficiency (chronic) (peripheral): Secondary | ICD-10-CM | POA: Diagnosis not present

## 2015-04-04 DIAGNOSIS — J449 Chronic obstructive pulmonary disease, unspecified: Secondary | ICD-10-CM | POA: Diagnosis not present

## 2015-04-04 DIAGNOSIS — E1165 Type 2 diabetes mellitus with hyperglycemia: Secondary | ICD-10-CM | POA: Diagnosis not present

## 2015-04-06 ENCOUNTER — Encounter: Payer: Self-pay | Admitting: *Deleted

## 2015-04-06 ENCOUNTER — Ambulatory Visit (INDEPENDENT_AMBULATORY_CARE_PROVIDER_SITE_OTHER): Payer: Medicare Other | Admitting: *Deleted

## 2015-04-06 DIAGNOSIS — L89892 Pressure ulcer of other site, stage 2: Secondary | ICD-10-CM | POA: Diagnosis not present

## 2015-04-06 DIAGNOSIS — I4891 Unspecified atrial fibrillation: Secondary | ICD-10-CM

## 2015-04-06 DIAGNOSIS — Z5181 Encounter for therapeutic drug level monitoring: Secondary | ICD-10-CM

## 2015-04-06 DIAGNOSIS — E1140 Type 2 diabetes mellitus with diabetic neuropathy, unspecified: Secondary | ICD-10-CM | POA: Diagnosis not present

## 2015-04-06 DIAGNOSIS — E1151 Type 2 diabetes mellitus with diabetic peripheral angiopathy without gangrene: Secondary | ICD-10-CM | POA: Diagnosis not present

## 2015-04-06 LAB — POCT INR: INR: 2.1

## 2015-04-06 NOTE — Progress Notes (Signed)
This encounter was created in error - please disregard.

## 2015-04-07 DIAGNOSIS — E1165 Type 2 diabetes mellitus with hyperglycemia: Secondary | ICD-10-CM | POA: Diagnosis not present

## 2015-04-07 DIAGNOSIS — I872 Venous insufficiency (chronic) (peripheral): Secondary | ICD-10-CM | POA: Diagnosis not present

## 2015-04-07 DIAGNOSIS — J449 Chronic obstructive pulmonary disease, unspecified: Secondary | ICD-10-CM | POA: Diagnosis not present

## 2015-04-07 DIAGNOSIS — I129 Hypertensive chronic kidney disease with stage 1 through stage 4 chronic kidney disease, or unspecified chronic kidney disease: Secondary | ICD-10-CM | POA: Diagnosis not present

## 2015-04-07 DIAGNOSIS — I5032 Chronic diastolic (congestive) heart failure: Secondary | ICD-10-CM | POA: Diagnosis not present

## 2015-04-07 DIAGNOSIS — N189 Chronic kidney disease, unspecified: Secondary | ICD-10-CM | POA: Diagnosis not present

## 2015-04-08 DIAGNOSIS — I872 Venous insufficiency (chronic) (peripheral): Secondary | ICD-10-CM | POA: Diagnosis not present

## 2015-04-08 DIAGNOSIS — N189 Chronic kidney disease, unspecified: Secondary | ICD-10-CM | POA: Diagnosis not present

## 2015-04-08 DIAGNOSIS — Z4800 Encounter for change or removal of nonsurgical wound dressing: Secondary | ICD-10-CM | POA: Diagnosis not present

## 2015-04-08 DIAGNOSIS — I5032 Chronic diastolic (congestive) heart failure: Secondary | ICD-10-CM | POA: Diagnosis not present

## 2015-04-08 DIAGNOSIS — Z87891 Personal history of nicotine dependence: Secondary | ICD-10-CM | POA: Diagnosis not present

## 2015-04-08 DIAGNOSIS — Z794 Long term (current) use of insulin: Secondary | ICD-10-CM | POA: Diagnosis not present

## 2015-04-08 DIAGNOSIS — Z9981 Dependence on supplemental oxygen: Secondary | ICD-10-CM | POA: Diagnosis not present

## 2015-04-08 DIAGNOSIS — I130 Hypertensive heart and chronic kidney disease with heart failure and stage 1 through stage 4 chronic kidney disease, or unspecified chronic kidney disease: Secondary | ICD-10-CM | POA: Diagnosis not present

## 2015-04-08 DIAGNOSIS — Z7901 Long term (current) use of anticoagulants: Secondary | ICD-10-CM | POA: Diagnosis not present

## 2015-04-08 DIAGNOSIS — E11622 Type 2 diabetes mellitus with other skin ulcer: Secondary | ICD-10-CM | POA: Diagnosis not present

## 2015-04-08 DIAGNOSIS — E1140 Type 2 diabetes mellitus with diabetic neuropathy, unspecified: Secondary | ICD-10-CM | POA: Diagnosis not present

## 2015-04-08 DIAGNOSIS — J449 Chronic obstructive pulmonary disease, unspecified: Secondary | ICD-10-CM | POA: Diagnosis not present

## 2015-04-08 DIAGNOSIS — L97521 Non-pressure chronic ulcer of other part of left foot limited to breakdown of skin: Secondary | ICD-10-CM | POA: Diagnosis not present

## 2015-04-08 DIAGNOSIS — M1990 Unspecified osteoarthritis, unspecified site: Secondary | ICD-10-CM | POA: Diagnosis not present

## 2015-04-09 DIAGNOSIS — Z4800 Encounter for change or removal of nonsurgical wound dressing: Secondary | ICD-10-CM | POA: Diagnosis not present

## 2015-04-09 DIAGNOSIS — I130 Hypertensive heart and chronic kidney disease with heart failure and stage 1 through stage 4 chronic kidney disease, or unspecified chronic kidney disease: Secondary | ICD-10-CM | POA: Diagnosis not present

## 2015-04-09 DIAGNOSIS — E1140 Type 2 diabetes mellitus with diabetic neuropathy, unspecified: Secondary | ICD-10-CM | POA: Diagnosis not present

## 2015-04-09 DIAGNOSIS — E11622 Type 2 diabetes mellitus with other skin ulcer: Secondary | ICD-10-CM | POA: Diagnosis not present

## 2015-04-09 DIAGNOSIS — L97521 Non-pressure chronic ulcer of other part of left foot limited to breakdown of skin: Secondary | ICD-10-CM | POA: Diagnosis not present

## 2015-04-09 DIAGNOSIS — I872 Venous insufficiency (chronic) (peripheral): Secondary | ICD-10-CM | POA: Diagnosis not present

## 2015-04-12 DIAGNOSIS — I130 Hypertensive heart and chronic kidney disease with heart failure and stage 1 through stage 4 chronic kidney disease, or unspecified chronic kidney disease: Secondary | ICD-10-CM | POA: Diagnosis not present

## 2015-04-12 DIAGNOSIS — I872 Venous insufficiency (chronic) (peripheral): Secondary | ICD-10-CM | POA: Diagnosis not present

## 2015-04-12 DIAGNOSIS — L97521 Non-pressure chronic ulcer of other part of left foot limited to breakdown of skin: Secondary | ICD-10-CM | POA: Diagnosis not present

## 2015-04-12 DIAGNOSIS — E1140 Type 2 diabetes mellitus with diabetic neuropathy, unspecified: Secondary | ICD-10-CM | POA: Diagnosis not present

## 2015-04-12 DIAGNOSIS — E11622 Type 2 diabetes mellitus with other skin ulcer: Secondary | ICD-10-CM | POA: Diagnosis not present

## 2015-04-12 DIAGNOSIS — Z4800 Encounter for change or removal of nonsurgical wound dressing: Secondary | ICD-10-CM | POA: Diagnosis not present

## 2015-04-14 DIAGNOSIS — E11622 Type 2 diabetes mellitus with other skin ulcer: Secondary | ICD-10-CM | POA: Diagnosis not present

## 2015-04-14 DIAGNOSIS — E1140 Type 2 diabetes mellitus with diabetic neuropathy, unspecified: Secondary | ICD-10-CM | POA: Diagnosis not present

## 2015-04-14 DIAGNOSIS — Z4800 Encounter for change or removal of nonsurgical wound dressing: Secondary | ICD-10-CM | POA: Diagnosis not present

## 2015-04-14 DIAGNOSIS — Z418 Encounter for other procedures for purposes other than remedying health state: Secondary | ICD-10-CM | POA: Diagnosis not present

## 2015-04-14 DIAGNOSIS — I272 Other secondary pulmonary hypertension: Secondary | ICD-10-CM | POA: Diagnosis not present

## 2015-04-14 DIAGNOSIS — I509 Heart failure, unspecified: Secondary | ICD-10-CM | POA: Diagnosis not present

## 2015-04-14 DIAGNOSIS — J449 Chronic obstructive pulmonary disease, unspecified: Secondary | ICD-10-CM | POA: Diagnosis not present

## 2015-04-14 DIAGNOSIS — L97521 Non-pressure chronic ulcer of other part of left foot limited to breakdown of skin: Secondary | ICD-10-CM | POA: Diagnosis not present

## 2015-04-14 DIAGNOSIS — I872 Venous insufficiency (chronic) (peripheral): Secondary | ICD-10-CM | POA: Diagnosis not present

## 2015-04-14 DIAGNOSIS — I130 Hypertensive heart and chronic kidney disease with heart failure and stage 1 through stage 4 chronic kidney disease, or unspecified chronic kidney disease: Secondary | ICD-10-CM | POA: Diagnosis not present

## 2015-04-16 DIAGNOSIS — E1140 Type 2 diabetes mellitus with diabetic neuropathy, unspecified: Secondary | ICD-10-CM | POA: Diagnosis not present

## 2015-04-16 DIAGNOSIS — E11622 Type 2 diabetes mellitus with other skin ulcer: Secondary | ICD-10-CM | POA: Diagnosis not present

## 2015-04-16 DIAGNOSIS — I130 Hypertensive heart and chronic kidney disease with heart failure and stage 1 through stage 4 chronic kidney disease, or unspecified chronic kidney disease: Secondary | ICD-10-CM | POA: Diagnosis not present

## 2015-04-16 DIAGNOSIS — Z4800 Encounter for change or removal of nonsurgical wound dressing: Secondary | ICD-10-CM | POA: Diagnosis not present

## 2015-04-16 DIAGNOSIS — I872 Venous insufficiency (chronic) (peripheral): Secondary | ICD-10-CM | POA: Diagnosis not present

## 2015-04-16 DIAGNOSIS — L97521 Non-pressure chronic ulcer of other part of left foot limited to breakdown of skin: Secondary | ICD-10-CM | POA: Diagnosis not present

## 2015-04-19 DIAGNOSIS — E11622 Type 2 diabetes mellitus with other skin ulcer: Secondary | ICD-10-CM | POA: Diagnosis not present

## 2015-04-19 DIAGNOSIS — I130 Hypertensive heart and chronic kidney disease with heart failure and stage 1 through stage 4 chronic kidney disease, or unspecified chronic kidney disease: Secondary | ICD-10-CM | POA: Diagnosis not present

## 2015-04-19 DIAGNOSIS — E1140 Type 2 diabetes mellitus with diabetic neuropathy, unspecified: Secondary | ICD-10-CM | POA: Diagnosis not present

## 2015-04-19 DIAGNOSIS — L97521 Non-pressure chronic ulcer of other part of left foot limited to breakdown of skin: Secondary | ICD-10-CM | POA: Diagnosis not present

## 2015-04-19 DIAGNOSIS — I872 Venous insufficiency (chronic) (peripheral): Secondary | ICD-10-CM | POA: Diagnosis not present

## 2015-04-19 DIAGNOSIS — Z4800 Encounter for change or removal of nonsurgical wound dressing: Secondary | ICD-10-CM | POA: Diagnosis not present

## 2015-04-21 DIAGNOSIS — Z4800 Encounter for change or removal of nonsurgical wound dressing: Secondary | ICD-10-CM | POA: Diagnosis not present

## 2015-04-21 DIAGNOSIS — E11622 Type 2 diabetes mellitus with other skin ulcer: Secondary | ICD-10-CM | POA: Diagnosis not present

## 2015-04-21 DIAGNOSIS — L97521 Non-pressure chronic ulcer of other part of left foot limited to breakdown of skin: Secondary | ICD-10-CM | POA: Diagnosis not present

## 2015-04-21 DIAGNOSIS — I130 Hypertensive heart and chronic kidney disease with heart failure and stage 1 through stage 4 chronic kidney disease, or unspecified chronic kidney disease: Secondary | ICD-10-CM | POA: Diagnosis not present

## 2015-04-21 DIAGNOSIS — I872 Venous insufficiency (chronic) (peripheral): Secondary | ICD-10-CM | POA: Diagnosis not present

## 2015-04-21 DIAGNOSIS — E1140 Type 2 diabetes mellitus with diabetic neuropathy, unspecified: Secondary | ICD-10-CM | POA: Diagnosis not present

## 2015-04-23 DIAGNOSIS — I130 Hypertensive heart and chronic kidney disease with heart failure and stage 1 through stage 4 chronic kidney disease, or unspecified chronic kidney disease: Secondary | ICD-10-CM | POA: Diagnosis not present

## 2015-04-23 DIAGNOSIS — L97521 Non-pressure chronic ulcer of other part of left foot limited to breakdown of skin: Secondary | ICD-10-CM | POA: Diagnosis not present

## 2015-04-23 DIAGNOSIS — E1140 Type 2 diabetes mellitus with diabetic neuropathy, unspecified: Secondary | ICD-10-CM | POA: Diagnosis not present

## 2015-04-23 DIAGNOSIS — E11622 Type 2 diabetes mellitus with other skin ulcer: Secondary | ICD-10-CM | POA: Diagnosis not present

## 2015-04-23 DIAGNOSIS — Z4800 Encounter for change or removal of nonsurgical wound dressing: Secondary | ICD-10-CM | POA: Diagnosis not present

## 2015-04-23 DIAGNOSIS — I872 Venous insufficiency (chronic) (peripheral): Secondary | ICD-10-CM | POA: Diagnosis not present

## 2015-04-26 DIAGNOSIS — I130 Hypertensive heart and chronic kidney disease with heart failure and stage 1 through stage 4 chronic kidney disease, or unspecified chronic kidney disease: Secondary | ICD-10-CM | POA: Diagnosis not present

## 2015-04-26 DIAGNOSIS — Z4800 Encounter for change or removal of nonsurgical wound dressing: Secondary | ICD-10-CM | POA: Diagnosis not present

## 2015-04-26 DIAGNOSIS — I872 Venous insufficiency (chronic) (peripheral): Secondary | ICD-10-CM | POA: Diagnosis not present

## 2015-04-26 DIAGNOSIS — E11622 Type 2 diabetes mellitus with other skin ulcer: Secondary | ICD-10-CM | POA: Diagnosis not present

## 2015-04-26 DIAGNOSIS — E1140 Type 2 diabetes mellitus with diabetic neuropathy, unspecified: Secondary | ICD-10-CM | POA: Diagnosis not present

## 2015-04-26 DIAGNOSIS — L97521 Non-pressure chronic ulcer of other part of left foot limited to breakdown of skin: Secondary | ICD-10-CM | POA: Diagnosis not present

## 2015-04-29 DIAGNOSIS — E1140 Type 2 diabetes mellitus with diabetic neuropathy, unspecified: Secondary | ICD-10-CM | POA: Diagnosis not present

## 2015-04-29 DIAGNOSIS — Z4800 Encounter for change or removal of nonsurgical wound dressing: Secondary | ICD-10-CM | POA: Diagnosis not present

## 2015-04-29 DIAGNOSIS — I130 Hypertensive heart and chronic kidney disease with heart failure and stage 1 through stage 4 chronic kidney disease, or unspecified chronic kidney disease: Secondary | ICD-10-CM | POA: Diagnosis not present

## 2015-04-29 DIAGNOSIS — I872 Venous insufficiency (chronic) (peripheral): Secondary | ICD-10-CM | POA: Diagnosis not present

## 2015-04-29 DIAGNOSIS — L97521 Non-pressure chronic ulcer of other part of left foot limited to breakdown of skin: Secondary | ICD-10-CM | POA: Diagnosis not present

## 2015-04-29 DIAGNOSIS — E11622 Type 2 diabetes mellitus with other skin ulcer: Secondary | ICD-10-CM | POA: Diagnosis not present

## 2015-04-30 DIAGNOSIS — I509 Heart failure, unspecified: Secondary | ICD-10-CM | POA: Diagnosis not present

## 2015-04-30 DIAGNOSIS — Z299 Encounter for prophylactic measures, unspecified: Secondary | ICD-10-CM | POA: Diagnosis not present

## 2015-04-30 DIAGNOSIS — Z789 Other specified health status: Secondary | ICD-10-CM | POA: Diagnosis not present

## 2015-04-30 DIAGNOSIS — E1140 Type 2 diabetes mellitus with diabetic neuropathy, unspecified: Secondary | ICD-10-CM | POA: Diagnosis not present

## 2015-04-30 DIAGNOSIS — Z6841 Body Mass Index (BMI) 40.0 and over, adult: Secondary | ICD-10-CM | POA: Diagnosis not present

## 2015-05-03 DIAGNOSIS — E11622 Type 2 diabetes mellitus with other skin ulcer: Secondary | ICD-10-CM | POA: Diagnosis not present

## 2015-05-03 DIAGNOSIS — L97521 Non-pressure chronic ulcer of other part of left foot limited to breakdown of skin: Secondary | ICD-10-CM | POA: Diagnosis not present

## 2015-05-03 DIAGNOSIS — Z4800 Encounter for change or removal of nonsurgical wound dressing: Secondary | ICD-10-CM | POA: Diagnosis not present

## 2015-05-03 DIAGNOSIS — I130 Hypertensive heart and chronic kidney disease with heart failure and stage 1 through stage 4 chronic kidney disease, or unspecified chronic kidney disease: Secondary | ICD-10-CM | POA: Diagnosis not present

## 2015-05-03 DIAGNOSIS — I872 Venous insufficiency (chronic) (peripheral): Secondary | ICD-10-CM | POA: Diagnosis not present

## 2015-05-03 DIAGNOSIS — E1140 Type 2 diabetes mellitus with diabetic neuropathy, unspecified: Secondary | ICD-10-CM | POA: Diagnosis not present

## 2015-05-04 ENCOUNTER — Ambulatory Visit (INDEPENDENT_AMBULATORY_CARE_PROVIDER_SITE_OTHER): Payer: Medicare Other | Admitting: *Deleted

## 2015-05-04 DIAGNOSIS — Z5181 Encounter for therapeutic drug level monitoring: Secondary | ICD-10-CM

## 2015-05-04 DIAGNOSIS — L89892 Pressure ulcer of other site, stage 2: Secondary | ICD-10-CM | POA: Diagnosis not present

## 2015-05-04 DIAGNOSIS — I4891 Unspecified atrial fibrillation: Secondary | ICD-10-CM

## 2015-05-04 DIAGNOSIS — E1140 Type 2 diabetes mellitus with diabetic neuropathy, unspecified: Secondary | ICD-10-CM | POA: Diagnosis not present

## 2015-05-04 LAB — POCT INR: INR: 2.3

## 2015-05-06 DIAGNOSIS — I130 Hypertensive heart and chronic kidney disease with heart failure and stage 1 through stage 4 chronic kidney disease, or unspecified chronic kidney disease: Secondary | ICD-10-CM | POA: Diagnosis not present

## 2015-05-06 DIAGNOSIS — I872 Venous insufficiency (chronic) (peripheral): Secondary | ICD-10-CM | POA: Diagnosis not present

## 2015-05-06 DIAGNOSIS — E11622 Type 2 diabetes mellitus with other skin ulcer: Secondary | ICD-10-CM | POA: Diagnosis not present

## 2015-05-06 DIAGNOSIS — L97521 Non-pressure chronic ulcer of other part of left foot limited to breakdown of skin: Secondary | ICD-10-CM | POA: Diagnosis not present

## 2015-05-06 DIAGNOSIS — E1140 Type 2 diabetes mellitus with diabetic neuropathy, unspecified: Secondary | ICD-10-CM | POA: Diagnosis not present

## 2015-05-06 DIAGNOSIS — Z4800 Encounter for change or removal of nonsurgical wound dressing: Secondary | ICD-10-CM | POA: Diagnosis not present

## 2015-05-10 ENCOUNTER — Telehealth: Payer: Self-pay | Admitting: Cardiovascular Disease

## 2015-05-11 DIAGNOSIS — Z4800 Encounter for change or removal of nonsurgical wound dressing: Secondary | ICD-10-CM | POA: Diagnosis not present

## 2015-05-11 DIAGNOSIS — I130 Hypertensive heart and chronic kidney disease with heart failure and stage 1 through stage 4 chronic kidney disease, or unspecified chronic kidney disease: Secondary | ICD-10-CM | POA: Diagnosis not present

## 2015-05-11 DIAGNOSIS — E1140 Type 2 diabetes mellitus with diabetic neuropathy, unspecified: Secondary | ICD-10-CM | POA: Diagnosis not present

## 2015-05-11 DIAGNOSIS — E11622 Type 2 diabetes mellitus with other skin ulcer: Secondary | ICD-10-CM | POA: Diagnosis not present

## 2015-05-11 DIAGNOSIS — L97521 Non-pressure chronic ulcer of other part of left foot limited to breakdown of skin: Secondary | ICD-10-CM | POA: Diagnosis not present

## 2015-05-11 DIAGNOSIS — I872 Venous insufficiency (chronic) (peripheral): Secondary | ICD-10-CM | POA: Diagnosis not present

## 2015-05-13 DIAGNOSIS — I130 Hypertensive heart and chronic kidney disease with heart failure and stage 1 through stage 4 chronic kidney disease, or unspecified chronic kidney disease: Secondary | ICD-10-CM | POA: Diagnosis not present

## 2015-05-13 DIAGNOSIS — Z4800 Encounter for change or removal of nonsurgical wound dressing: Secondary | ICD-10-CM | POA: Diagnosis not present

## 2015-05-13 DIAGNOSIS — I872 Venous insufficiency (chronic) (peripheral): Secondary | ICD-10-CM | POA: Diagnosis not present

## 2015-05-13 DIAGNOSIS — E11622 Type 2 diabetes mellitus with other skin ulcer: Secondary | ICD-10-CM | POA: Diagnosis not present

## 2015-05-13 DIAGNOSIS — E1140 Type 2 diabetes mellitus with diabetic neuropathy, unspecified: Secondary | ICD-10-CM | POA: Diagnosis not present

## 2015-05-13 DIAGNOSIS — L97521 Non-pressure chronic ulcer of other part of left foot limited to breakdown of skin: Secondary | ICD-10-CM | POA: Diagnosis not present

## 2015-05-17 DIAGNOSIS — I130 Hypertensive heart and chronic kidney disease with heart failure and stage 1 through stage 4 chronic kidney disease, or unspecified chronic kidney disease: Secondary | ICD-10-CM | POA: Diagnosis not present

## 2015-05-17 DIAGNOSIS — Z4800 Encounter for change or removal of nonsurgical wound dressing: Secondary | ICD-10-CM | POA: Diagnosis not present

## 2015-05-17 DIAGNOSIS — E1140 Type 2 diabetes mellitus with diabetic neuropathy, unspecified: Secondary | ICD-10-CM | POA: Diagnosis not present

## 2015-05-17 DIAGNOSIS — L97521 Non-pressure chronic ulcer of other part of left foot limited to breakdown of skin: Secondary | ICD-10-CM | POA: Diagnosis not present

## 2015-05-17 DIAGNOSIS — E11622 Type 2 diabetes mellitus with other skin ulcer: Secondary | ICD-10-CM | POA: Diagnosis not present

## 2015-05-17 DIAGNOSIS — I872 Venous insufficiency (chronic) (peripheral): Secondary | ICD-10-CM | POA: Diagnosis not present

## 2015-05-18 DIAGNOSIS — L89892 Pressure ulcer of other site, stage 2: Secondary | ICD-10-CM | POA: Diagnosis not present

## 2015-05-18 DIAGNOSIS — E1140 Type 2 diabetes mellitus with diabetic neuropathy, unspecified: Secondary | ICD-10-CM | POA: Diagnosis not present

## 2015-05-18 DIAGNOSIS — E1151 Type 2 diabetes mellitus with diabetic peripheral angiopathy without gangrene: Secondary | ICD-10-CM | POA: Diagnosis not present

## 2015-05-20 DIAGNOSIS — Z4800 Encounter for change or removal of nonsurgical wound dressing: Secondary | ICD-10-CM | POA: Diagnosis not present

## 2015-05-20 DIAGNOSIS — I130 Hypertensive heart and chronic kidney disease with heart failure and stage 1 through stage 4 chronic kidney disease, or unspecified chronic kidney disease: Secondary | ICD-10-CM | POA: Diagnosis not present

## 2015-05-20 DIAGNOSIS — I872 Venous insufficiency (chronic) (peripheral): Secondary | ICD-10-CM | POA: Diagnosis not present

## 2015-05-20 DIAGNOSIS — E11622 Type 2 diabetes mellitus with other skin ulcer: Secondary | ICD-10-CM | POA: Diagnosis not present

## 2015-05-20 DIAGNOSIS — E1140 Type 2 diabetes mellitus with diabetic neuropathy, unspecified: Secondary | ICD-10-CM | POA: Diagnosis not present

## 2015-05-20 DIAGNOSIS — L97521 Non-pressure chronic ulcer of other part of left foot limited to breakdown of skin: Secondary | ICD-10-CM | POA: Diagnosis not present

## 2015-05-25 DIAGNOSIS — I872 Venous insufficiency (chronic) (peripheral): Secondary | ICD-10-CM | POA: Diagnosis not present

## 2015-05-25 DIAGNOSIS — E1140 Type 2 diabetes mellitus with diabetic neuropathy, unspecified: Secondary | ICD-10-CM | POA: Diagnosis not present

## 2015-05-25 DIAGNOSIS — Z4800 Encounter for change or removal of nonsurgical wound dressing: Secondary | ICD-10-CM | POA: Diagnosis not present

## 2015-05-25 DIAGNOSIS — I130 Hypertensive heart and chronic kidney disease with heart failure and stage 1 through stage 4 chronic kidney disease, or unspecified chronic kidney disease: Secondary | ICD-10-CM | POA: Diagnosis not present

## 2015-05-25 DIAGNOSIS — E11622 Type 2 diabetes mellitus with other skin ulcer: Secondary | ICD-10-CM | POA: Diagnosis not present

## 2015-05-25 DIAGNOSIS — L97521 Non-pressure chronic ulcer of other part of left foot limited to breakdown of skin: Secondary | ICD-10-CM | POA: Diagnosis not present

## 2015-05-28 DIAGNOSIS — E1140 Type 2 diabetes mellitus with diabetic neuropathy, unspecified: Secondary | ICD-10-CM | POA: Diagnosis not present

## 2015-05-28 DIAGNOSIS — I872 Venous insufficiency (chronic) (peripheral): Secondary | ICD-10-CM | POA: Diagnosis not present

## 2015-05-28 DIAGNOSIS — Z4800 Encounter for change or removal of nonsurgical wound dressing: Secondary | ICD-10-CM | POA: Diagnosis not present

## 2015-05-28 DIAGNOSIS — L97521 Non-pressure chronic ulcer of other part of left foot limited to breakdown of skin: Secondary | ICD-10-CM | POA: Diagnosis not present

## 2015-05-28 DIAGNOSIS — I130 Hypertensive heart and chronic kidney disease with heart failure and stage 1 through stage 4 chronic kidney disease, or unspecified chronic kidney disease: Secondary | ICD-10-CM | POA: Diagnosis not present

## 2015-05-28 DIAGNOSIS — E11622 Type 2 diabetes mellitus with other skin ulcer: Secondary | ICD-10-CM | POA: Diagnosis not present

## 2015-05-31 DIAGNOSIS — L97521 Non-pressure chronic ulcer of other part of left foot limited to breakdown of skin: Secondary | ICD-10-CM | POA: Diagnosis not present

## 2015-05-31 DIAGNOSIS — J449 Chronic obstructive pulmonary disease, unspecified: Secondary | ICD-10-CM | POA: Diagnosis not present

## 2015-05-31 DIAGNOSIS — E11622 Type 2 diabetes mellitus with other skin ulcer: Secondary | ICD-10-CM | POA: Diagnosis not present

## 2015-05-31 DIAGNOSIS — I509 Heart failure, unspecified: Secondary | ICD-10-CM | POA: Diagnosis not present

## 2015-05-31 DIAGNOSIS — Z4800 Encounter for change or removal of nonsurgical wound dressing: Secondary | ICD-10-CM | POA: Diagnosis not present

## 2015-05-31 DIAGNOSIS — E1140 Type 2 diabetes mellitus with diabetic neuropathy, unspecified: Secondary | ICD-10-CM | POA: Diagnosis not present

## 2015-05-31 DIAGNOSIS — I872 Venous insufficiency (chronic) (peripheral): Secondary | ICD-10-CM | POA: Diagnosis not present

## 2015-05-31 DIAGNOSIS — I272 Other secondary pulmonary hypertension: Secondary | ICD-10-CM | POA: Diagnosis not present

## 2015-05-31 DIAGNOSIS — I130 Hypertensive heart and chronic kidney disease with heart failure and stage 1 through stage 4 chronic kidney disease, or unspecified chronic kidney disease: Secondary | ICD-10-CM | POA: Diagnosis not present

## 2015-06-01 ENCOUNTER — Ambulatory Visit (INDEPENDENT_AMBULATORY_CARE_PROVIDER_SITE_OTHER): Payer: Medicare Other | Admitting: *Deleted

## 2015-06-01 ENCOUNTER — Telehealth: Payer: Self-pay | Admitting: Cardiovascular Disease

## 2015-06-01 DIAGNOSIS — I4891 Unspecified atrial fibrillation: Secondary | ICD-10-CM | POA: Diagnosis not present

## 2015-06-01 DIAGNOSIS — Z5181 Encounter for therapeutic drug level monitoring: Secondary | ICD-10-CM

## 2015-06-01 DIAGNOSIS — E1151 Type 2 diabetes mellitus with diabetic peripheral angiopathy without gangrene: Secondary | ICD-10-CM | POA: Diagnosis not present

## 2015-06-01 DIAGNOSIS — L89892 Pressure ulcer of other site, stage 2: Secondary | ICD-10-CM | POA: Diagnosis not present

## 2015-06-01 DIAGNOSIS — E1140 Type 2 diabetes mellitus with diabetic neuropathy, unspecified: Secondary | ICD-10-CM | POA: Diagnosis not present

## 2015-06-01 LAB — POCT INR: INR: 2.7

## 2015-06-01 MED ORDER — WARFARIN SODIUM 7.5 MG PO TABS: ORAL_TABLET | ORAL | Status: DC

## 2015-06-01 NOTE — Telephone Encounter (Signed)
Rx sent to [facility]

## 2015-06-03 DIAGNOSIS — E11622 Type 2 diabetes mellitus with other skin ulcer: Secondary | ICD-10-CM | POA: Diagnosis not present

## 2015-06-03 DIAGNOSIS — Z4800 Encounter for change or removal of nonsurgical wound dressing: Secondary | ICD-10-CM | POA: Diagnosis not present

## 2015-06-03 DIAGNOSIS — I872 Venous insufficiency (chronic) (peripheral): Secondary | ICD-10-CM | POA: Diagnosis not present

## 2015-06-03 DIAGNOSIS — I130 Hypertensive heart and chronic kidney disease with heart failure and stage 1 through stage 4 chronic kidney disease, or unspecified chronic kidney disease: Secondary | ICD-10-CM | POA: Diagnosis not present

## 2015-06-03 DIAGNOSIS — E1140 Type 2 diabetes mellitus with diabetic neuropathy, unspecified: Secondary | ICD-10-CM | POA: Diagnosis not present

## 2015-06-03 DIAGNOSIS — L97521 Non-pressure chronic ulcer of other part of left foot limited to breakdown of skin: Secondary | ICD-10-CM | POA: Diagnosis not present

## 2015-06-07 ENCOUNTER — Other Ambulatory Visit: Payer: Self-pay | Admitting: Cardiovascular Disease

## 2015-06-07 NOTE — Telephone Encounter (Signed)
Already done by L. Joneen Caraway, RN on 06/01/2015 to [facility].    [facility] facility notified.

## 2015-06-07 NOTE — Telephone Encounter (Signed)
REFILL:   Needs refill on warfarin (COUMADIN) 7.5 MG

## 2015-06-09 DIAGNOSIS — I10 Essential (primary) hypertension: Secondary | ICD-10-CM | POA: Diagnosis not present

## 2015-06-09 DIAGNOSIS — J449 Chronic obstructive pulmonary disease, unspecified: Secondary | ICD-10-CM | POA: Diagnosis not present

## 2015-06-09 DIAGNOSIS — I509 Heart failure, unspecified: Secondary | ICD-10-CM | POA: Diagnosis not present

## 2015-06-09 DIAGNOSIS — E119 Type 2 diabetes mellitus without complications: Secondary | ICD-10-CM | POA: Diagnosis not present

## 2015-06-15 DIAGNOSIS — M25511 Pain in right shoulder: Secondary | ICD-10-CM | POA: Diagnosis not present

## 2015-06-15 DIAGNOSIS — I10 Essential (primary) hypertension: Secondary | ICD-10-CM | POA: Diagnosis not present

## 2015-06-15 DIAGNOSIS — I272 Other secondary pulmonary hypertension: Secondary | ICD-10-CM | POA: Diagnosis not present

## 2015-06-15 DIAGNOSIS — M1990 Unspecified osteoarthritis, unspecified site: Secondary | ICD-10-CM | POA: Diagnosis not present

## 2015-06-15 DIAGNOSIS — Z299 Encounter for prophylactic measures, unspecified: Secondary | ICD-10-CM | POA: Diagnosis not present

## 2015-06-21 DIAGNOSIS — E1151 Type 2 diabetes mellitus with diabetic peripheral angiopathy without gangrene: Secondary | ICD-10-CM | POA: Diagnosis not present

## 2015-06-21 DIAGNOSIS — E1140 Type 2 diabetes mellitus with diabetic neuropathy, unspecified: Secondary | ICD-10-CM | POA: Diagnosis not present

## 2015-06-21 DIAGNOSIS — L89892 Pressure ulcer of other site, stage 2: Secondary | ICD-10-CM | POA: Diagnosis not present

## 2015-06-29 DIAGNOSIS — J449 Chronic obstructive pulmonary disease, unspecified: Secondary | ICD-10-CM | POA: Diagnosis not present

## 2015-06-29 DIAGNOSIS — I509 Heart failure, unspecified: Secondary | ICD-10-CM | POA: Diagnosis not present

## 2015-06-29 DIAGNOSIS — E119 Type 2 diabetes mellitus without complications: Secondary | ICD-10-CM | POA: Diagnosis not present

## 2015-06-29 DIAGNOSIS — I10 Essential (primary) hypertension: Secondary | ICD-10-CM | POA: Diagnosis not present

## 2015-07-12 DIAGNOSIS — Z207 Contact with and (suspected) exposure to pediculosis, acariasis and other infestations: Secondary | ICD-10-CM | POA: Diagnosis not present

## 2015-07-12 DIAGNOSIS — E119 Type 2 diabetes mellitus without complications: Secondary | ICD-10-CM | POA: Diagnosis not present

## 2015-07-12 DIAGNOSIS — I10 Essential (primary) hypertension: Secondary | ICD-10-CM | POA: Diagnosis not present

## 2015-07-12 DIAGNOSIS — J449 Chronic obstructive pulmonary disease, unspecified: Secondary | ICD-10-CM | POA: Diagnosis not present

## 2015-07-13 ENCOUNTER — Ambulatory Visit (INDEPENDENT_AMBULATORY_CARE_PROVIDER_SITE_OTHER): Payer: Medicare Other | Admitting: *Deleted

## 2015-07-13 DIAGNOSIS — I4891 Unspecified atrial fibrillation: Secondary | ICD-10-CM

## 2015-07-13 DIAGNOSIS — Z5181 Encounter for therapeutic drug level monitoring: Secondary | ICD-10-CM

## 2015-07-13 LAB — POCT INR
INR: 1.3
INR: 2.7

## 2015-07-14 DIAGNOSIS — M25511 Pain in right shoulder: Secondary | ICD-10-CM | POA: Diagnosis not present

## 2015-07-14 DIAGNOSIS — M7501 Adhesive capsulitis of right shoulder: Secondary | ICD-10-CM | POA: Diagnosis not present

## 2015-07-14 DIAGNOSIS — M7541 Impingement syndrome of right shoulder: Secondary | ICD-10-CM | POA: Diagnosis not present

## 2015-07-20 DIAGNOSIS — I5032 Chronic diastolic (congestive) heart failure: Secondary | ICD-10-CM | POA: Diagnosis not present

## 2015-07-20 DIAGNOSIS — Z299 Encounter for prophylactic measures, unspecified: Secondary | ICD-10-CM | POA: Diagnosis not present

## 2015-07-20 DIAGNOSIS — E1140 Type 2 diabetes mellitus with diabetic neuropathy, unspecified: Secondary | ICD-10-CM | POA: Diagnosis not present

## 2015-07-22 ENCOUNTER — Ambulatory Visit (INDEPENDENT_AMBULATORY_CARE_PROVIDER_SITE_OTHER): Payer: Medicare Other | Admitting: *Deleted

## 2015-07-22 DIAGNOSIS — I4891 Unspecified atrial fibrillation: Secondary | ICD-10-CM

## 2015-07-22 DIAGNOSIS — Z5181 Encounter for therapeutic drug level monitoring: Secondary | ICD-10-CM

## 2015-07-22 LAB — POCT INR: INR: 2.4

## 2015-07-28 DIAGNOSIS — M7541 Impingement syndrome of right shoulder: Secondary | ICD-10-CM | POA: Diagnosis not present

## 2015-07-28 DIAGNOSIS — M7501 Adhesive capsulitis of right shoulder: Secondary | ICD-10-CM | POA: Diagnosis not present

## 2015-07-28 DIAGNOSIS — M25511 Pain in right shoulder: Secondary | ICD-10-CM | POA: Diagnosis not present

## 2015-08-02 DIAGNOSIS — J449 Chronic obstructive pulmonary disease, unspecified: Secondary | ICD-10-CM | POA: Diagnosis not present

## 2015-08-02 DIAGNOSIS — I509 Heart failure, unspecified: Secondary | ICD-10-CM | POA: Diagnosis not present

## 2015-08-02 DIAGNOSIS — I10 Essential (primary) hypertension: Secondary | ICD-10-CM | POA: Diagnosis not present

## 2015-08-02 DIAGNOSIS — E119 Type 2 diabetes mellitus without complications: Secondary | ICD-10-CM | POA: Diagnosis not present

## 2015-08-09 DIAGNOSIS — I509 Heart failure, unspecified: Secondary | ICD-10-CM | POA: Diagnosis not present

## 2015-08-09 DIAGNOSIS — Z299 Encounter for prophylactic measures, unspecified: Secondary | ICD-10-CM | POA: Diagnosis not present

## 2015-08-09 DIAGNOSIS — E1140 Type 2 diabetes mellitus with diabetic neuropathy, unspecified: Secondary | ICD-10-CM | POA: Diagnosis not present

## 2015-08-09 DIAGNOSIS — N183 Chronic kidney disease, stage 3 (moderate): Secondary | ICD-10-CM | POA: Diagnosis not present

## 2015-08-10 ENCOUNTER — Ambulatory Visit (INDEPENDENT_AMBULATORY_CARE_PROVIDER_SITE_OTHER): Payer: Medicare Other | Admitting: *Deleted

## 2015-08-10 DIAGNOSIS — Z5181 Encounter for therapeutic drug level monitoring: Secondary | ICD-10-CM | POA: Diagnosis not present

## 2015-08-10 DIAGNOSIS — I4891 Unspecified atrial fibrillation: Secondary | ICD-10-CM | POA: Diagnosis not present

## 2015-08-10 LAB — POCT INR: INR: 2.3

## 2015-08-11 DIAGNOSIS — E1140 Type 2 diabetes mellitus with diabetic neuropathy, unspecified: Secondary | ICD-10-CM | POA: Diagnosis not present

## 2015-08-11 DIAGNOSIS — L89893 Pressure ulcer of other site, stage 3: Secondary | ICD-10-CM | POA: Diagnosis not present

## 2015-08-11 DIAGNOSIS — E1151 Type 2 diabetes mellitus with diabetic peripheral angiopathy without gangrene: Secondary | ICD-10-CM | POA: Diagnosis not present

## 2015-08-18 DIAGNOSIS — W19XXXA Unspecified fall, initial encounter: Secondary | ICD-10-CM | POA: Diagnosis not present

## 2015-08-18 DIAGNOSIS — M25062 Hemarthrosis, left knee: Secondary | ICD-10-CM | POA: Diagnosis not present

## 2015-08-18 DIAGNOSIS — R52 Pain, unspecified: Secondary | ICD-10-CM | POA: Diagnosis not present

## 2015-08-18 DIAGNOSIS — M7989 Other specified soft tissue disorders: Secondary | ICD-10-CM | POA: Diagnosis not present

## 2015-08-18 DIAGNOSIS — I2510 Atherosclerotic heart disease of native coronary artery without angina pectoris: Secondary | ICD-10-CM | POA: Diagnosis not present

## 2015-08-18 DIAGNOSIS — I214 Non-ST elevation (NSTEMI) myocardial infarction: Secondary | ICD-10-CM | POA: Diagnosis not present

## 2015-08-18 DIAGNOSIS — M79605 Pain in left leg: Secondary | ICD-10-CM | POA: Diagnosis not present

## 2015-08-18 DIAGNOSIS — N189 Chronic kidney disease, unspecified: Secondary | ICD-10-CM | POA: Diagnosis not present

## 2015-08-18 DIAGNOSIS — S8392XA Sprain of unspecified site of left knee, initial encounter: Secondary | ICD-10-CM | POA: Diagnosis not present

## 2015-08-18 DIAGNOSIS — E119 Type 2 diabetes mellitus without complications: Secondary | ICD-10-CM | POA: Diagnosis not present

## 2015-08-18 DIAGNOSIS — D638 Anemia in other chronic diseases classified elsewhere: Secondary | ICD-10-CM | POA: Diagnosis not present

## 2015-08-18 DIAGNOSIS — M25562 Pain in left knee: Secondary | ICD-10-CM | POA: Diagnosis not present

## 2015-08-19 DIAGNOSIS — I214 Non-ST elevation (NSTEMI) myocardial infarction: Secondary | ICD-10-CM | POA: Diagnosis not present

## 2015-08-19 DIAGNOSIS — E119 Type 2 diabetes mellitus without complications: Secondary | ICD-10-CM | POA: Diagnosis not present

## 2015-08-19 DIAGNOSIS — N189 Chronic kidney disease, unspecified: Secondary | ICD-10-CM | POA: Diagnosis not present

## 2015-08-19 DIAGNOSIS — M25062 Hemarthrosis, left knee: Secondary | ICD-10-CM | POA: Diagnosis not present

## 2015-08-20 ENCOUNTER — Inpatient Hospital Stay (HOSPITAL_COMMUNITY)
Admission: AD | Admit: 2015-08-20 | Discharge: 2015-08-23 | DRG: 313 | Disposition: A | Discharge status: Home / Self Care | Payer: Medicare Other | Source: Other Acute Inpatient Hospital | Attending: Cardiovascular Disease | Admitting: Cardiovascular Disease

## 2015-08-20 ENCOUNTER — Encounter (HOSPITAL_COMMUNITY): Payer: Self-pay | Admitting: Cardiology

## 2015-08-20 DIAGNOSIS — I5032 Chronic diastolic (congestive) heart failure: Secondary | ICD-10-CM | POA: Diagnosis not present

## 2015-08-20 DIAGNOSIS — M19042 Primary osteoarthritis, left hand: Secondary | ICD-10-CM | POA: Diagnosis not present

## 2015-08-20 DIAGNOSIS — I2510 Atherosclerotic heart disease of native coronary artery without angina pectoris: Secondary | ICD-10-CM | POA: Diagnosis present

## 2015-08-20 DIAGNOSIS — R072 Precordial pain: Secondary | ICD-10-CM | POA: Diagnosis not present

## 2015-08-20 DIAGNOSIS — M19041 Primary osteoarthritis, right hand: Secondary | ICD-10-CM | POA: Diagnosis present

## 2015-08-20 DIAGNOSIS — J449 Chronic obstructive pulmonary disease, unspecified: Secondary | ICD-10-CM | POA: Diagnosis present

## 2015-08-20 DIAGNOSIS — I4891 Unspecified atrial fibrillation: Secondary | ICD-10-CM | POA: Diagnosis present

## 2015-08-20 DIAGNOSIS — I480 Paroxysmal atrial fibrillation: Secondary | ICD-10-CM | POA: Diagnosis present

## 2015-08-20 DIAGNOSIS — Z794 Long term (current) use of insulin: Secondary | ICD-10-CM | POA: Diagnosis not present

## 2015-08-20 DIAGNOSIS — K429 Umbilical hernia without obstruction or gangrene: Secondary | ICD-10-CM

## 2015-08-20 DIAGNOSIS — E119 Type 2 diabetes mellitus without complications: Secondary | ICD-10-CM

## 2015-08-20 DIAGNOSIS — N179 Acute kidney failure, unspecified: Secondary | ICD-10-CM | POA: Diagnosis present

## 2015-08-20 DIAGNOSIS — R600 Localized edema: Secondary | ICD-10-CM | POA: Diagnosis not present

## 2015-08-20 DIAGNOSIS — D638 Anemia in other chronic diseases classified elsewhere: Secondary | ICD-10-CM | POA: Diagnosis present

## 2015-08-20 DIAGNOSIS — N183 Chronic kidney disease, stage 3 unspecified: Secondary | ICD-10-CM | POA: Diagnosis present

## 2015-08-20 DIAGNOSIS — Z7901 Long term (current) use of anticoagulants: Secondary | ICD-10-CM | POA: Diagnosis not present

## 2015-08-20 DIAGNOSIS — E1122 Type 2 diabetes mellitus with diabetic chronic kidney disease: Secondary | ICD-10-CM | POA: Diagnosis present

## 2015-08-20 DIAGNOSIS — I130 Hypertensive heart and chronic kidney disease with heart failure and stage 1 through stage 4 chronic kidney disease, or unspecified chronic kidney disease: Secondary | ICD-10-CM | POA: Diagnosis not present

## 2015-08-20 DIAGNOSIS — R7989 Other specified abnormal findings of blood chemistry: Secondary | ICD-10-CM | POA: Diagnosis not present

## 2015-08-20 DIAGNOSIS — Z87891 Personal history of nicotine dependence: Secondary | ICD-10-CM

## 2015-08-20 DIAGNOSIS — R079 Chest pain, unspecified: Secondary | ICD-10-CM | POA: Diagnosis present

## 2015-08-20 DIAGNOSIS — I10 Essential (primary) hypertension: Secondary | ICD-10-CM | POA: Diagnosis not present

## 2015-08-20 DIAGNOSIS — N189 Chronic kidney disease, unspecified: Secondary | ICD-10-CM | POA: Diagnosis present

## 2015-08-20 DIAGNOSIS — I495 Sick sinus syndrome: Secondary | ICD-10-CM | POA: Diagnosis not present

## 2015-08-20 DIAGNOSIS — D649 Anemia, unspecified: Secondary | ICD-10-CM | POA: Diagnosis present

## 2015-08-20 DIAGNOSIS — I214 Non-ST elevation (NSTEMI) myocardial infarction: Secondary | ICD-10-CM | POA: Diagnosis present

## 2015-08-20 DIAGNOSIS — M25062 Hemarthrosis, left knee: Secondary | ICD-10-CM | POA: Diagnosis present

## 2015-08-20 DIAGNOSIS — Z79899 Other long term (current) drug therapy: Secondary | ICD-10-CM | POA: Diagnosis not present

## 2015-08-20 DIAGNOSIS — R0789 Other chest pain: Principal | ICD-10-CM | POA: Diagnosis present

## 2015-08-20 DIAGNOSIS — Z7902 Long term (current) use of antithrombotics/antiplatelets: Secondary | ICD-10-CM | POA: Diagnosis not present

## 2015-08-20 HISTORY — DX: Paroxysmal atrial fibrillation: I48.0

## 2015-08-20 LAB — COMPREHENSIVE METABOLIC PANEL
ALBUMIN: 3.2 g/dL — AB (ref 3.5–5.0)
ALT: 14 U/L (ref 14–54)
ANION GAP: 8 (ref 5–15)
AST: 15 U/L (ref 15–41)
Alkaline Phosphatase: 48 U/L (ref 38–126)
BUN: 48 mg/dL — AB (ref 6–20)
CALCIUM: 8.7 mg/dL — AB (ref 8.9–10.3)
CO2: 24 mmol/L (ref 22–32)
CREATININE: 1.36 mg/dL — AB (ref 0.44–1.00)
Chloride: 108 mmol/L (ref 101–111)
GFR calc Af Amer: 43 mL/min — ABNORMAL LOW (ref >60)
GFR, EST NON AFRICAN AMERICAN: 37 mL/min — AB (ref >60)
Glucose, Bld: 224 mg/dL — ABNORMAL HIGH (ref 65–99)
Potassium: 4.4 mmol/L (ref 3.5–5.1)
Sodium: 140 mmol/L (ref 135–145)
TOTAL PROTEIN: 6.7 g/dL (ref 6.5–8.1)
Total Bilirubin: 1 mg/dL (ref 0.3–1.2)

## 2015-08-20 LAB — CBC WITH DIFFERENTIAL/PLATELET
Basophils Absolute: 0 10*3/uL (ref 0.0–0.1)
Basophils Relative: 0 %
EOS ABS: 0.1 10*3/uL (ref 0.0–0.7)
EOS PCT: 1 %
HCT: 29.1 % — ABNORMAL LOW (ref 36.0–46.0)
HEMOGLOBIN: 9.2 g/dL — AB (ref 12.0–15.0)
LYMPHS ABS: 0.9 10*3/uL (ref 0.7–4.0)
Lymphocytes Relative: 13 %
MCH: 30.3 pg (ref 26.0–34.0)
MCHC: 31.6 g/dL (ref 30.0–36.0)
MCV: 95.7 fL (ref 78.0–100.0)
MONO ABS: 0.7 10*3/uL (ref 0.1–1.0)
MONOS PCT: 10 %
Neutro Abs: 5.3 10*3/uL (ref 1.7–7.7)
Neutrophils Relative %: 76 %
PLATELETS: 195 10*3/uL (ref 150–400)
RBC: 3.04 MIL/uL — ABNORMAL LOW (ref 3.87–5.11)
RDW: 13.5 % (ref 11.5–15.5)
WBC: 6.9 10*3/uL (ref 4.0–10.5)

## 2015-08-20 LAB — GLUCOSE, CAPILLARY
GLUCOSE-CAPILLARY: 213 mg/dL — AB (ref 65–99)
Glucose-Capillary: 259 mg/dL — ABNORMAL HIGH (ref 65–99)

## 2015-08-20 LAB — PROTIME-INR
INR: 1.55 — ABNORMAL HIGH (ref 0.00–1.49)
Prothrombin Time: 18.6 seconds — ABNORMAL HIGH (ref 11.6–15.2)

## 2015-08-20 LAB — TROPONIN I: TROPONIN I: 0.35 ng/mL — AB (ref <0.03)

## 2015-08-20 MED ORDER — ASPIRIN 81 MG PO CHEW: 324.0000 mg | CHEWABLE_TABLET | ORAL | Status: AC

## 2015-08-20 MED ORDER — NITROGLYCERIN 0.4 MG SL SUBL
0.4000 mg | SUBLINGUAL_TABLET | SUBLINGUAL | Status: DC | PRN
  Filled 2015-08-20: qty 1

## 2015-08-20 MED ORDER — HEPARIN SODIUM (PORCINE) 5000 UNIT/ML IJ SOLN
5000.0000 [IU] | Freq: Three times a day (TID) | INTRAMUSCULAR | Status: DC
  Administered 2015-08-20 – 2015-08-23 (×8): 5000 [IU] via SUBCUTANEOUS
  Filled 2015-08-20 (×7): qty 1

## 2015-08-20 MED ORDER — METOPROLOL TARTRATE 50 MG PO TABS
50.0000 mg | ORAL_TABLET | Freq: Two times a day (BID) | ORAL | Status: DC
  Administered 2015-08-20 – 2015-08-21 (×3): 50 mg via ORAL
  Filled 2015-08-20 (×4): qty 1

## 2015-08-20 MED ORDER — ATORVASTATIN CALCIUM 20 MG PO TABS
20.0000 mg | ORAL_TABLET | Freq: Every day | ORAL | Status: DC
  Administered 2015-08-20 – 2015-08-23 (×4): 20 mg via ORAL
  Filled 2015-08-20 (×4): qty 1

## 2015-08-20 MED ORDER — HYDRALAZINE HCL 25 MG PO TABS
25.0000 mg | ORAL_TABLET | Freq: Three times a day (TID) | ORAL | Status: DC
  Administered 2015-08-20 – 2015-08-21 (×2): 25 mg via ORAL
  Filled 2015-08-20 (×2): qty 1

## 2015-08-20 MED ORDER — INSULIN GLARGINE 100 UNIT/ML ~~LOC~~ SOLN
12.0000 [IU] | Freq: Every day | SUBCUTANEOUS | Status: DC
  Administered 2015-08-21 – 2015-08-23 (×3): 12 [IU] via SUBCUTANEOUS
  Filled 2015-08-20 (×3): qty 0.12

## 2015-08-20 MED ORDER — ASPIRIN 300 MG RE SUPP: 300.0000 mg | RECTAL | Status: AC

## 2015-08-20 MED ORDER — ONDANSETRON HCL 4 MG/2ML IJ SOLN: 4.0000 mg | Freq: Four times a day (QID) | INTRAMUSCULAR | Status: DC | PRN

## 2015-08-20 MED ORDER — NITROGLYCERIN 2 % TD OINT
0.5000 [in_us] | TOPICAL_OINTMENT | Freq: Three times a day (TID) | TRANSDERMAL | Status: DC
  Administered 2015-08-20 – 2015-08-23 (×7): 0.5 [in_us] via TOPICAL
  Filled 2015-08-20: qty 30

## 2015-08-20 MED ORDER — FUROSEMIDE 40 MG PO TABS
40.0000 mg | ORAL_TABLET | Freq: Every day | ORAL | Status: DC
  Administered 2015-08-21: 40 mg via ORAL
  Filled 2015-08-20 (×2): qty 1

## 2015-08-20 MED ORDER — ASPIRIN EC 81 MG PO TBEC
81.0000 mg | DELAYED_RELEASE_TABLET | Freq: Every day | ORAL | Status: DC
  Administered 2015-08-21 – 2015-08-23 (×3): 81 mg via ORAL
  Filled 2015-08-20 (×3): qty 1

## 2015-08-20 MED ORDER — LISINOPRIL 5 MG PO TABS: 5.0000 mg | ORAL_TABLET | Freq: Every day | ORAL | Status: DC

## 2015-08-20 MED ORDER — ACETAMINOPHEN 325 MG PO TABS
650.0000 mg | ORAL_TABLET | ORAL | Status: DC | PRN
  Administered 2015-08-21 – 2015-08-23 (×5): 650 mg via ORAL
  Filled 2015-08-20 (×5): qty 2

## 2015-08-20 MED ORDER — LISINOPRIL 10 MG PO TABS
10.0000 mg | ORAL_TABLET | Freq: Every day | ORAL | Status: DC
  Administered 2015-08-20 – 2015-08-21 (×2): 10 mg via ORAL
  Filled 2015-08-20 (×3): qty 1

## 2015-08-20 MED ORDER — INSULIN ASPART 100 UNIT/ML ~~LOC~~ SOLN
0.0000 [IU] | Freq: Three times a day (TID) | SUBCUTANEOUS | Status: DC
  Administered 2015-08-21: 3 [IU] via SUBCUTANEOUS
  Administered 2015-08-21: 8 [IU] via SUBCUTANEOUS
  Administered 2015-08-21: 3 [IU] via SUBCUTANEOUS
  Administered 2015-08-22: 5 [IU] via SUBCUTANEOUS
  Administered 2015-08-22: 2 [IU] via SUBCUTANEOUS
  Administered 2015-08-22: 3 [IU] via SUBCUTANEOUS
  Administered 2015-08-23: 2 [IU] via SUBCUTANEOUS
  Administered 2015-08-23: 5 [IU] via SUBCUTANEOUS

## 2015-08-20 MED ORDER — GABAPENTIN 300 MG PO CAPS
300.0000 mg | ORAL_CAPSULE | Freq: Three times a day (TID) | ORAL | Status: DC
  Administered 2015-08-20 – 2015-08-23 (×9): 300 mg via ORAL
  Filled 2015-08-20 (×9): qty 1

## 2015-08-20 NOTE — Progress Notes (Addendum)
CRITICAL VALUE ALERT  Critical value received:  Troponin 0.35  Date of notification: 08/20/2015  Time of notification: 1935  Critical value read back: yes  Nurse who received alert:  Kerrie Buffalo  MD notified (1st page):  Rosalyn Gess PA  Time of first page:  1940  MD notified (2nd page):  Time of second page:  Responding MD: Rosalyn Gess   Time MD responded:  1941; new orders received and in chart

## 2015-08-20 NOTE — H&P (Addendum)
History & Physical    Patient ID: Haley Cooper MRN: [id number], DOB/AGE: 09/20/1940   Admit date: 08/20/2015   Primary Physician: Glenda Chroman, MD Primary Cardiologist: Dr. Bronson Ing  Patient Profile    75 yo female with PMH of Chronic diastolic CHF/HTN/ DM II/CKD/A-fib (on Coumadin) who was transferred to [facility] from [facility] as an NSTEMI on 08/20/2015.  Past Medical History    Past Medical History  Diagnosis Date  . DM2 (diabetes mellitus, type 2) ([place])   . COPD (chronic obstructive pulmonary disease) ([place])   . HTN (hypertension)   . CHF (congestive heart failure) ([place])   . Shortness of breath   . Chronic kidney disease 04/2013     ACUTE   . Arthritis     HANDS  . Anemia     Past Surgical History  Procedure Laterality Date  . Vein surgery       Allergies  No Known Allergies  History of Present Illness    Haley Cooper is a 75 year old female patient of Dr. Bronson Ing with past medical history of Chronic diastolic CHF/HTN/ DM II/CKD/A-fib (on Coumadin). She was last seen in the office on 12/30/2014 for routine follow-up at that time she was noted to be in her normal state of health without chest pain or palpitations. The plan at that time was to continue on warfarin and other medications, along with Lasix 40 mg twice a day. Last 2-D echo from 04/2013 showed an EF of 0000000, grade 2 diastolic dysfunction with mildly elevated left atrium and peak PA pressures of 44 mmHg.   Haley Cooper currently lives at [facility] in [place]. He was transferred to [facility] with complaints of left knee pain, in the ED and arthrocentesis was done by the ER physician with bloody drainage noted but no infection. She was admitted due to difficulty ambulating with plans to have orthopedics evaluate her during her inpatient stay. According to staff she reported centralized chest pain this morning while attempting to move around in the bed. A  single troponin was drawn and noted to be 0.12. EKG from facility shows sinus rhythm with no acute ST/T-wave abnormalities, but nonspecific interventricular conduction delay. Does have a known history of atrial fibrillation, but not noted on EKG. The patient was transferred to [facility] to a stepdown bed after discussion with Dr. Claiborne Billings for admission for possible NSTEMI.  In talking with the patient she reports to me she has had centralized chest pain since Wednesday when she was admitted to the hospital and [place]. Reports this pain is only present when she is transitioning in the bed or attempting to get out of bed, normally ambulatory around her home facility without any reports of chest pain or dyspnea on exertion. Pain is not currently reproducible with palpation. Patient reports she was given one sublingual nitroglycerin while at [facility] regional earlier today which did relieve her chest pain. She denies any associated shortness of breath, nausea vomiting, cough, dizziness, lightheaded, palpitations or diaphoresis.  She is currently pain-free, and resting comfortably. Of note she has hypertensive on arrival to the unit with systolic pressure of [id number], and temperature of 99.6.   Home Medications    Prior to Admission medications   Medication Sig Start Date End Date Taking? Authorizing Provider  atorvastatin (LIPITOR) 20 MG tablet Take 20 mg by mouth daily.    Historical Provider, MD  furosemide (LASIX) 40 MG tablet Take 40 mg by mouth 2 (  two) times daily. 04/30/13   Domenic Polite, MD  gabapentin (NEURONTIN) 300 MG capsule Take 300 mg by mouth 3 (three) times daily. 04/30/13   Domenic Polite, MD  hydrALAZINE (APRESOLINE) 25 MG tablet Take 1 tablet (25 mg total) by mouth 3 (three) times daily. 05/05/13   Herminio Commons, MD  insulin glargine (LANTUS) 100 UNIT/ML injection Inject 0.12 mLs (12 Units total) into the skin every morning. 04/30/13   Domenic Polite, MD  insulin lispro (HUMALOG) 100  UNIT/ML injection Inject 10 Units into the skin daily. Use moderate sliding Scale 04/30/13   Domenic Polite, MD  iron polysaccharides (NIFEREX) 150 MG capsule Take 150 mg by mouth daily.    Historical Provider, MD  lisinopril (PRINIVIL,ZESTRIL) 20 MG tablet Take 1 tablet (20 mg total) by mouth daily. 02/17/14   Herminio Commons, MD  warfarin (COUMADIN) 7.5 MG tablet Take 1 tablet by mouth daily as directed by coumadin clinic 06/01/15   Herminio Commons, MD    Family History    No family history on file.  Social History    Social History   Social History  . Marital Status: Divorced    Spouse Name: N/A  . Number of Children: N/A  . Years of Education: N/A   Occupational History  . Not on file.   Social History Main Topics  . Smoking status: Former Smoker -- 1.00 packs/day for 29 years    Types: Cigarettes    Start date: 07/19/1955    Quit date: 02/21/1984  . Smokeless tobacco: Never Used  . Alcohol Use: No  . Drug Use: No  . Sexual Activity: No   Other Topics Concern  . Not on file   Social History Narrative     Review of Systems    General:  No chills, fever, night sweats or weight changes.  Cardiovascular: See HPI Dermatological: No rash, lesions/masses Respiratory: No cough, dyspnea Urologic: No hematuria, dysuria Abdominal:   No nausea, vomiting, diarrhea, bright red blood per rectum, melena, or hematemesis Neurologic:  No visual changes, wkns, changes in mental status. Msk: Left knee pain. All other systems reviewed and are otherwise negative except as noted above.  Physical Exam    Blood pressure 185/72, pulse 81, temperature 99.6 F (37.6 C), temperature source Oral, resp. rate 17, height 5\' 2"  (1.575 m), weight 197 lb 1.6 oz (89.404 kg), SpO2 96 %.  General: Pleasant Disheveled older female, NAD wearing 2L [place] Psych: Normal affect. Neuro: Alert and oriented X 3. Moves all extremities spontaneously. HEENT: Normal  Neck: Supple without bruits or  JVD. Lungs:  Resp regular and unlabored, diminished bilaterally. Heart: RRR no s3, s4, or murmurs. Abdomen: Soft, obese, non-tender, non-distended, BS + x 4. Hernia to LLQ. Extremities: No clubbing, cyanosis , 1+ bilateral LE edema. DP/PT/Radials 2+ and equal bilaterally. Venous stasis changes to bilateral lower extremities. Bruising noted to bilateral knees.  Labs      Radiology Studies    No results found.  ECG & Cardiac Imaging    EKG: SR Rate- 75 No acute ST/T wave abnormalities, PACs  ECHO: 04/2013  Study Conclusions  - Left ventricle: The cavity size was normal. Wall thickness was increased in a pattern of mild LVH. Systolic function was normal. The estimated ejection fraction was in the range of 55% to 60%. Features are consistent with a pseudonormal left ventricular filling pattern, with concomitant abnormal relaxation and increased filling pressure (grade 2 diastolic dysfunction). - Left atrium: The atrium was mildly  dilated. - Pulmonary arteries: PA peak pressure: 57mm Hg (S).  Assessment & Plan    Ms. Ranz is a 75 year old female patient of Dr. Bronson Ing with past medical history of Chronic diastolic CHF/HTN/ DM II/CKD/A-fib (on Coumadin). She was last seen in the office on 12/30/2014 for routine follow-up at that time she was noted to be in her normal state of health without chest pain or palpitations. The plan at that time was to continue on warfarin and other medications, along with Lasix 40 mg twice a day. Last 2-D echo from 04/2013 showed an EF of 0000000, grade 2 diastolic dysfunction with mildly dilated left atrium and peak PA pressures of 44 mmHg.   She was [facility] our service for further cardiac work up from [facility] on 08/20/2015 after reports of centralized chest pain and elevated trop 0.12.   1.NSTEMI: She reports having had centralized chest pain since being admitted to [facility] on Wednesday this week with  left knee hemarthrosis. Pain is only present with movement, and when she attempts to get out of bed. States she is normally ambulatory at the group home with any chest pain or DOE. Denies any increased LEE, palpitations, or cough. Was noted to be hypertensive on arrival to the unit with systolic in the [id number]. Currently without chest pain. --Cycle trops --Repeat 2D echo --Obtain repeat EKG --Check Lipids, Hbg A1c  2. HTN: Hypertensive on arrival, continue home medications. -- Continue hydralazine -- Start Metoprolol 50mg  BID  3. DM II: SSI -- Lantus 12 units HS  4. CKD III: Check BMET -- Decrease home lisinopril  5. A-Fib: Currently in SR on telemetry --This patients CHA2DS2-VASc Score and unadjusted Ischemic Stroke Rate (% per year) is equal to 9.7 % stroke rate/year from a score of 6 Above score calculated as 1 point each if present [CHF, HTN, DM, Vascular=MI/PAD/Aortic Plaque, Age if 65-74, or Female] Above score calculated as 2 points each if present [Age > 75, or Stroke/TIA/TE] -- Hold Coumadin in the setting of left knee hemarthrosis -- Check CBC  6. CHF: Does not appear to be volume overloaded on exam -- Repeat 2D echo -- Daily weights, I&Os. -- Home dose of lasix 40mg  BID, decrease to daily. Can titrate if needed.   Barnet Pall, NP-C Pager [phone] 08/20/2015, 4:09 PM   Patient seen and examined. Agree with assessment and plan.  In Haley Cooper is a 75 year old female from [place] who was transferred from [facility] today per Dr. Woody Seller with a mildly positive troponin at 0.12 and vague chest discomfort.  According to Dr. Woody Seller, the patient has a history of a stent.  However, according to the patient, she states that she has never had a cardiac procedure.  She has a history of paroxysmal atrial fibrillation and has been on Coumadin anticoagulation.  She has a history of hypertension, type 2 diabetes mellitus, stage III chronic kidney disease, and diastolic CHF.  She  denies any episodes of exertionally precipitated chest pain with over the last several days.  Admits to superficial chest wall pain which is exacerbated when she moves her upper body.  She denies this pain brought on with activity.  She was hospitalized and was found to have a left knee hemarthrosis for which she underwent successful drainage.  A troponin level was sent off because of her chest discomfort and this came back at 0.12.  She apparently was given a subligamentous glycerin tablet today are to transfer and she believes  this may have improved her sense of discomfort.  The patient is now transferred for further evaluation.  The patient has been on furosemide 40 mg twice a day, hydralazine 25 mg 3 times a day, lisinopril pill 20 mg, and Cardizem 30 mg twice a day for blood pressure and heart rate.  She is on insulin for diabetes mellitus.  Her social history is notable in that she is divorced.  She previously had smoked cigarettes for 29 years.  Of note, the patient has 3 sons and 2 daughters.  All her children have children and her oldest daughter has 85 children!  She has a total of 136 combined grandchildren and great grandchildren!!!  Upon presentation today, she is hypertensive with a blood pressure of 185/72 and her resting pulse is 81.  She is edentulous.  She did not have carotid bruits.  There is no JVD.  She had decreased breath sounds in her lungs.  There was definite bilateral chest wall tenderness in the upper costochondral region.  Rhythm was regular with a 1/6 systolic murmur in the aortic area and along the left sternal border.  There was no S3 gallop.  Her abdomen was notable for a large ventral/umbilical hernia.  Her skin was somewhat warm to touch.  There was mild residual ecchymosis at the left knee without significant effusion.  She had lower extremity bilateral edema with venous stasis changes.  Her ECG reviewed by me from [facility] has shown normal sinus rhythm with a PAC.  There are a small  nondiagnostic Q waves in the inferior leads.  There are no significant ST-T changes.  I'm doubtful that the patient is having an acute coronary syndrome.  Her mild troponin elevation may represent possible demand ischemia.  She is hypertensive.  Presently, I will discontinue her low dose Cardizem.  I will initiate metoprolol tartrate at 50 mg twice a day.  With her renal insufficiency and creatinine of 1.5.  I will decrease her lisinopril from 20 mg down to 10 mg.  She will continue her present dose of hydralazine.  In addition, I will decrease her Lasix from 40 mg twice a day to just daily.  She will undergo 2-D echo Doppler study to evaluate both systolic and diastolic function as well as her systolic murmur.  Normal need to monitor her temperature since her temperature on presentation was 99.6.  Recent knee aspirate was negative for bacterial growth.  She will be monitored on telemetry.  Will follow-up renal function, CBC with differential, thyroid studies, lipids, hemoglobin A1c and recheck troponin level. Further recommendations will be forthcoming .  Upon completion of the above studies.  Troy Sine, MD, [facility] 08/20/2015 5:22 PM

## 2015-08-21 DIAGNOSIS — M19042 Primary osteoarthritis, left hand: Secondary | ICD-10-CM | POA: Diagnosis not present

## 2015-08-21 DIAGNOSIS — I480 Paroxysmal atrial fibrillation: Secondary | ICD-10-CM | POA: Diagnosis not present

## 2015-08-21 DIAGNOSIS — D649 Anemia, unspecified: Secondary | ICD-10-CM | POA: Diagnosis not present

## 2015-08-21 DIAGNOSIS — I495 Sick sinus syndrome: Secondary | ICD-10-CM | POA: Diagnosis not present

## 2015-08-21 DIAGNOSIS — R0789 Other chest pain: Secondary | ICD-10-CM | POA: Diagnosis not present

## 2015-08-21 DIAGNOSIS — N183 Chronic kidney disease, stage 3 (moderate): Secondary | ICD-10-CM | POA: Diagnosis not present

## 2015-08-21 DIAGNOSIS — I5032 Chronic diastolic (congestive) heart failure: Secondary | ICD-10-CM | POA: Diagnosis not present

## 2015-08-21 DIAGNOSIS — N179 Acute kidney failure, unspecified: Secondary | ICD-10-CM | POA: Diagnosis not present

## 2015-08-21 DIAGNOSIS — J449 Chronic obstructive pulmonary disease, unspecified: Secondary | ICD-10-CM | POA: Diagnosis not present

## 2015-08-21 DIAGNOSIS — M25062 Hemarthrosis, left knee: Secondary | ICD-10-CM | POA: Diagnosis not present

## 2015-08-21 DIAGNOSIS — E1122 Type 2 diabetes mellitus with diabetic chronic kidney disease: Secondary | ICD-10-CM | POA: Diagnosis not present

## 2015-08-21 DIAGNOSIS — I130 Hypertensive heart and chronic kidney disease with heart failure and stage 1 through stage 4 chronic kidney disease, or unspecified chronic kidney disease: Secondary | ICD-10-CM | POA: Diagnosis not present

## 2015-08-21 LAB — URINALYSIS, ROUTINE W REFLEX MICROSCOPIC
Bilirubin Urine: NEGATIVE
GLUCOSE, UA: NEGATIVE mg/dL
KETONES UR: NEGATIVE mg/dL
Nitrite: NEGATIVE
PROTEIN: 30 mg/dL — AB
Specific Gravity, Urine: 1.017 (ref 1.005–1.030)
pH: 5.5 (ref 5.0–8.0)

## 2015-08-21 LAB — URINE MICROSCOPIC-ADD ON

## 2015-08-21 LAB — BASIC METABOLIC PANEL
Anion gap: 8 (ref 5–15)
BUN: 46 mg/dL — AB (ref 6–20)
CALCIUM: 8.5 mg/dL — AB (ref 8.9–10.3)
CHLORIDE: 107 mmol/L (ref 101–111)
CO2: 26 mmol/L (ref 22–32)
CREATININE: 1.44 mg/dL — AB (ref 0.44–1.00)
GFR calc Af Amer: 40 mL/min — ABNORMAL LOW (ref >60)
GFR calc non Af Amer: 35 mL/min — ABNORMAL LOW (ref >60)
Glucose, Bld: 205 mg/dL — ABNORMAL HIGH (ref 65–99)
Potassium: 4.3 mmol/L (ref 3.5–5.1)
SODIUM: 141 mmol/L (ref 135–145)

## 2015-08-21 LAB — GLUCOSE, CAPILLARY
Glucose-Capillary: 197 mg/dL — ABNORMAL HIGH (ref 65–99)
Glucose-Capillary: 275 mg/dL — ABNORMAL HIGH (ref 65–99)
Glucose-Capillary: 178 mg/dL — ABNORMAL HIGH (ref 65–99)
Glucose-Capillary: 138 mg/dL — ABNORMAL HIGH (ref 65–99)

## 2015-08-21 LAB — LIPID PANEL
CHOLESTEROL: 129 mg/dL (ref 0–200)
HDL: 35 mg/dL — ABNORMAL LOW (ref >40)
LDL Cholesterol: 74 mg/dL (ref 0–99)
Total CHOL/HDL Ratio: 3.7 RATIO
Triglycerides: 99 mg/dL (ref <150)
VLDL: 20 mg/dL (ref 0–40)

## 2015-08-21 LAB — TROPONIN I
TROPONIN I: 0.51 ng/mL — AB (ref <0.03)
Troponin I: 0.48 ng/mL (ref <0.03)

## 2015-08-21 IMAGING — US US RENAL
1 series · 14 of 25 positions shown · non-contrast
Comparison: None.

CLINICAL DATA: Increased BUN and creatinine.

EXAM:
RENAL/URINARY TRACT ULTRASOUND COMPLETE

[Series 1: us renal · 0.20mm/px · 14 of 38 slices shown]
[im 1/38]
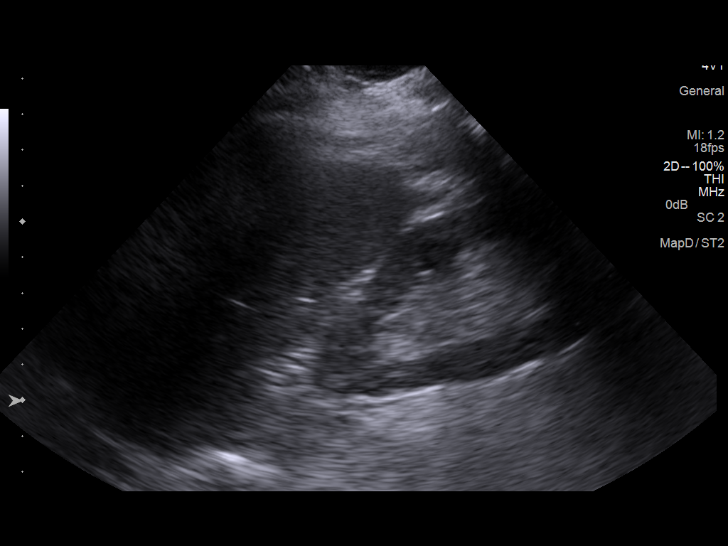
[im 4/38]
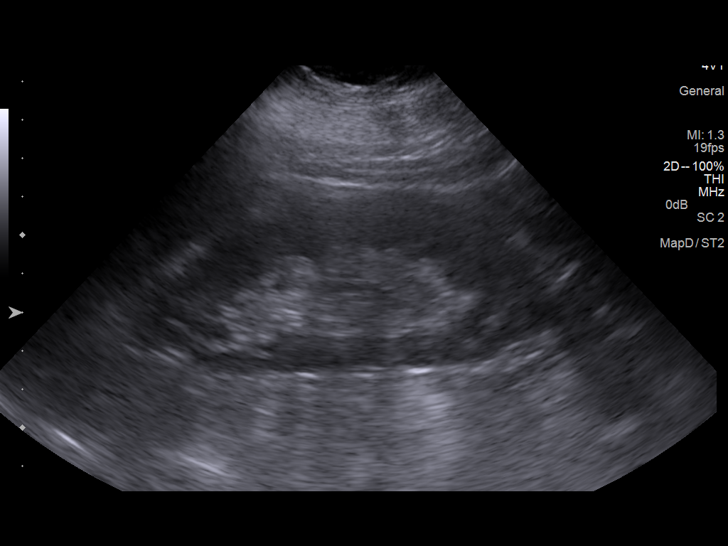
[im 7/38]
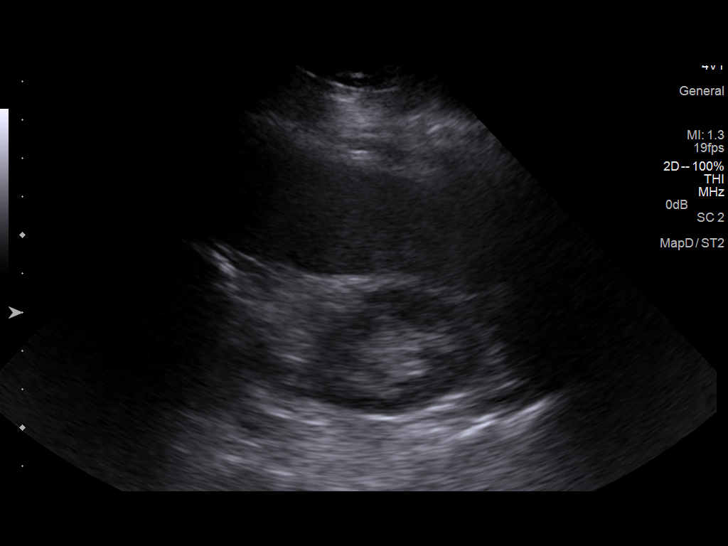
[im 10/38]
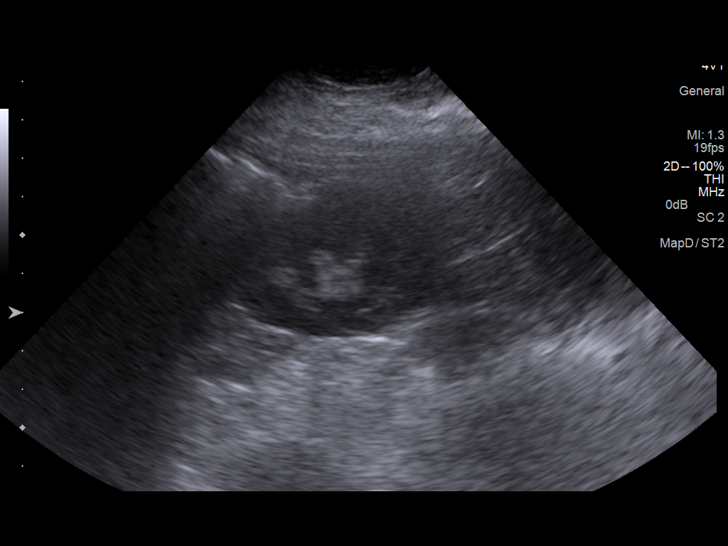
[im 13/38]
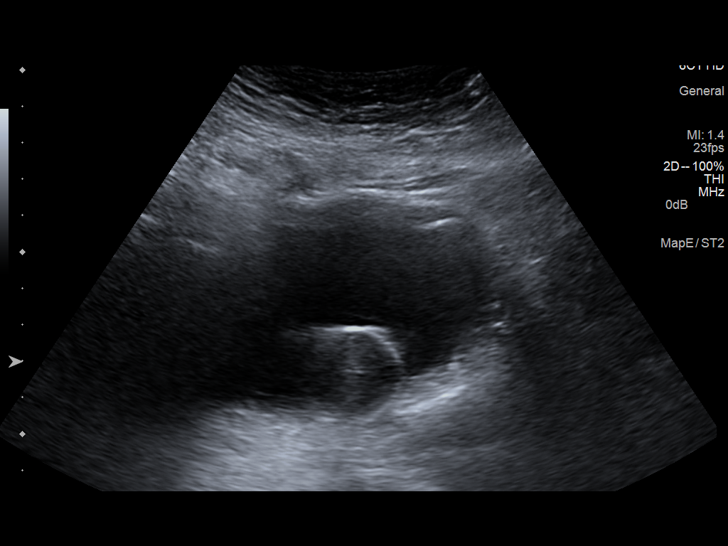
[im 14/38]
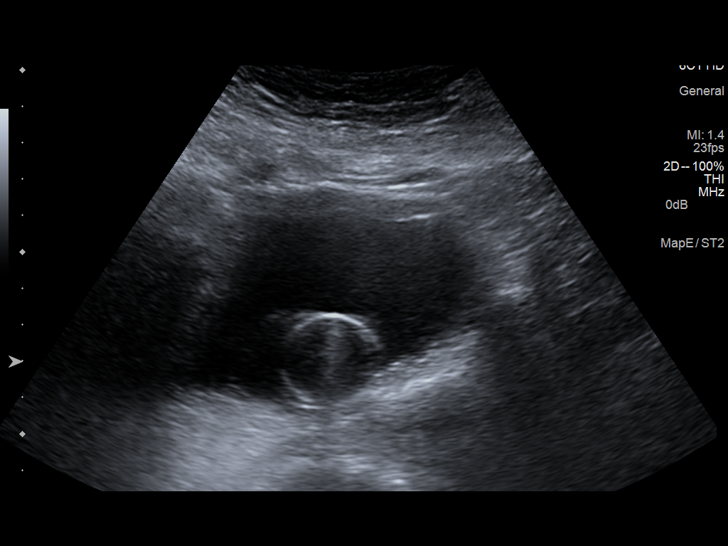
[im 17/38]
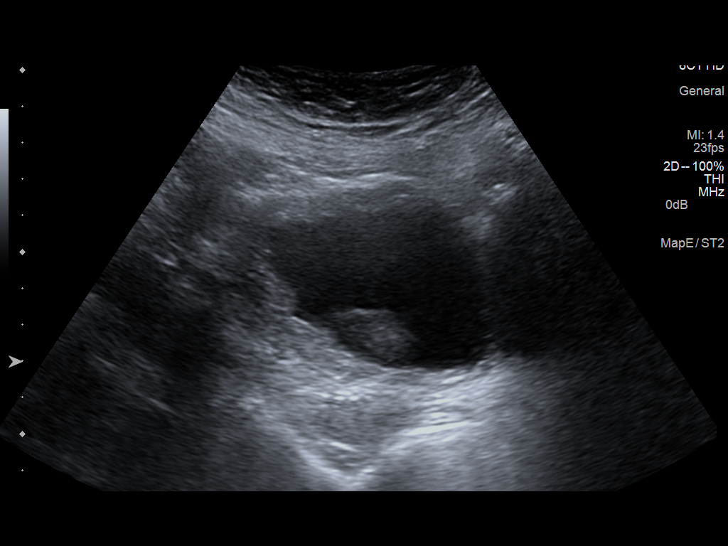
[im 21/38]
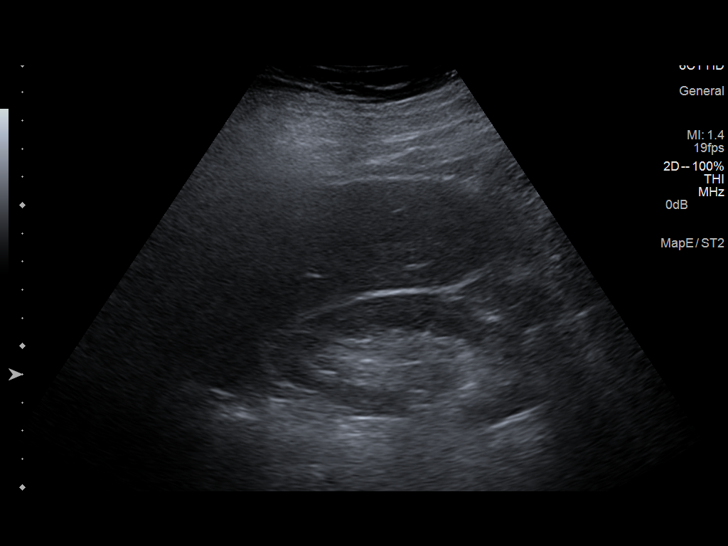
[im 24/38]
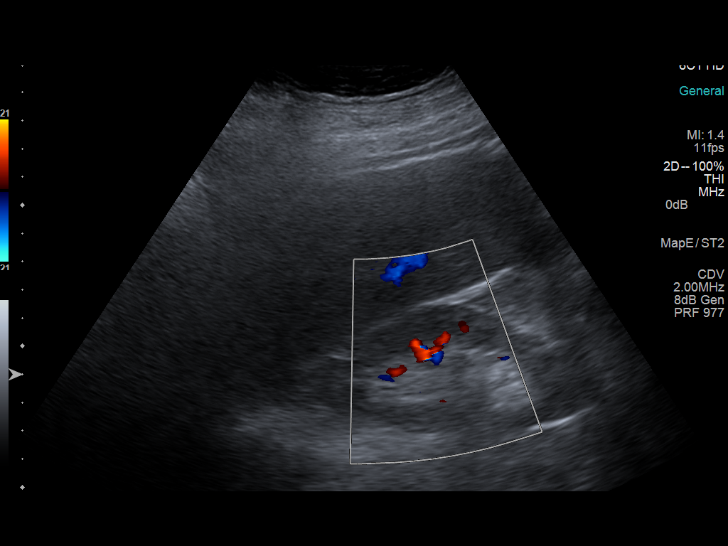
[im 25/38]
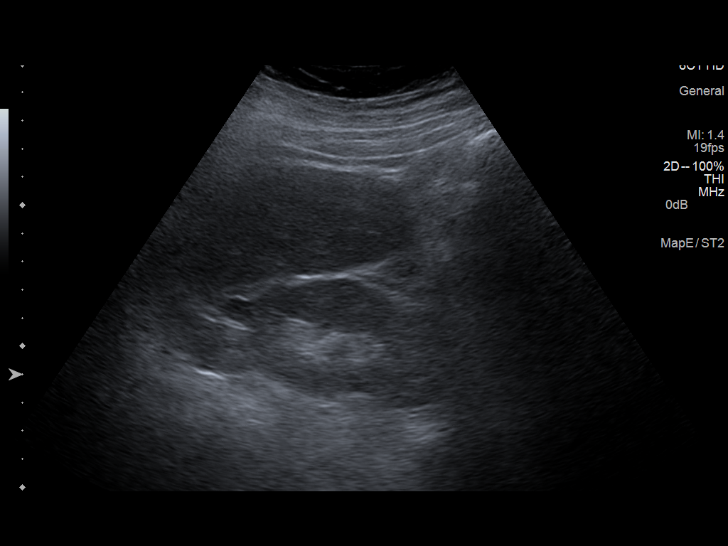
[im 28/38]
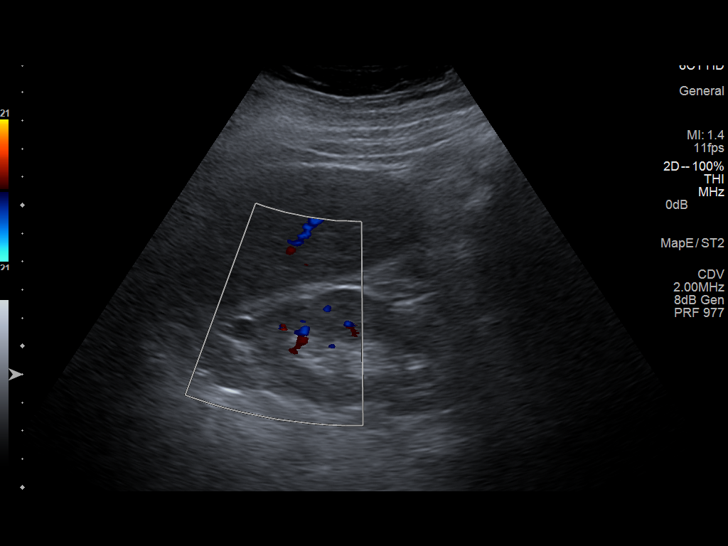
[im 31/38]
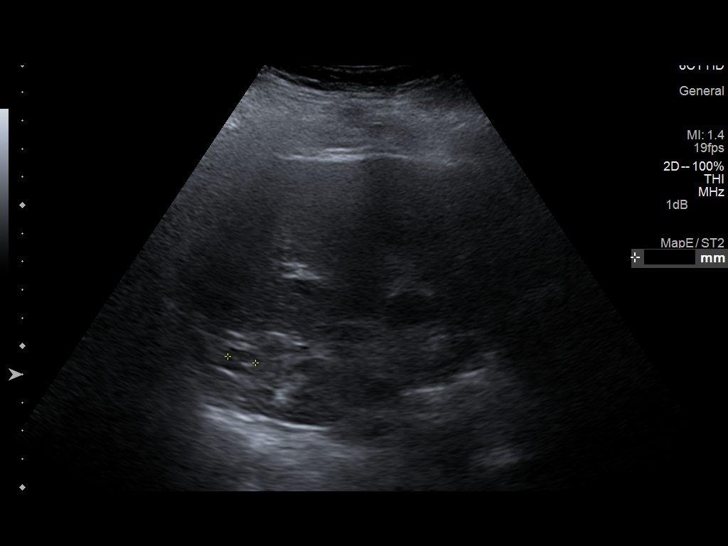
[im 34/38]
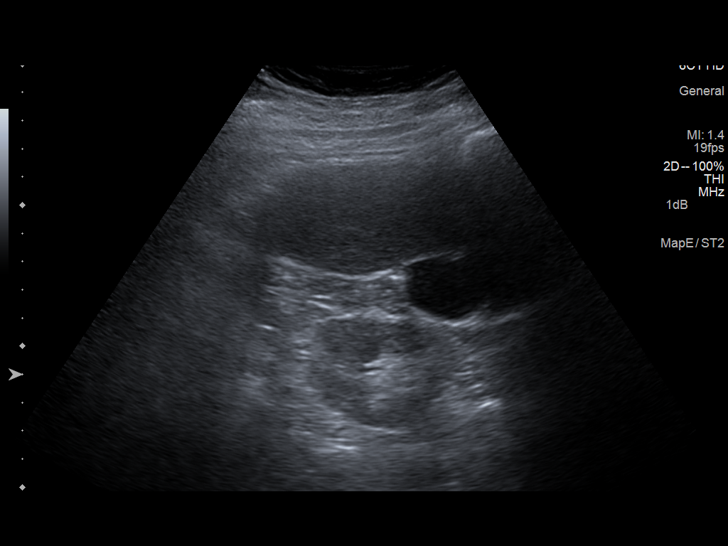
[im 38/38]
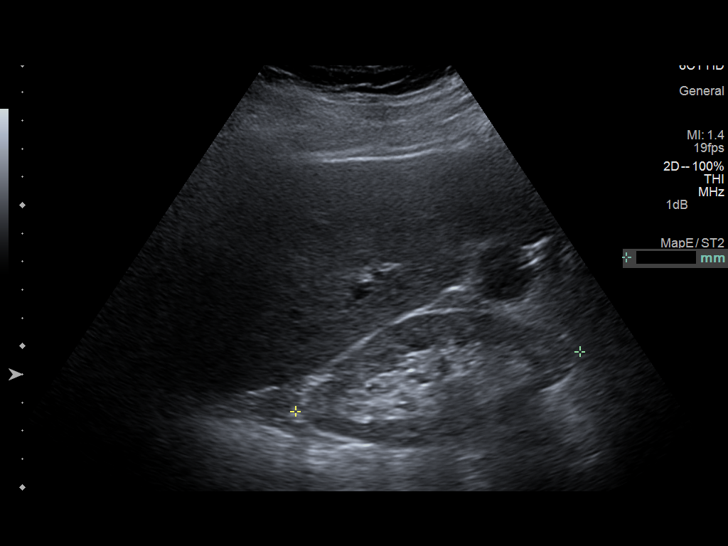

[14 of 25 positions shown; findings below may reference images not displayed]

FINDINGS: Right Kidney:

Length: 10.4 cm. Normal renal cortical thickness and echogenicity.
No hydronephrosis. Within the superior pole of the right kidney
there is a partially exophytic 1.2 x 0.8 x 1.0 cm hypoechoic mass.

Left Kidney:

Length: 11.1 cm. Echogenicity within normal limits. No mass or
hydronephrosis visualized.

Bladder:

Decompressed with a Foley catheter.
IMPRESSION: 1. No hydronephrosis.
2. Hypoechoic mass within the superior pole of the right kidney is
nonspecific in appearance however favored to represent a cyst.
Consider followup ultrasound in 3 months to evaluate for stability
if no additional diagnostic testing is performed in the interval.

## 2015-08-21 MED ORDER — HYDRALAZINE HCL 50 MG PO TABS
50.0000 mg | ORAL_TABLET | Freq: Three times a day (TID) | ORAL | Status: DC
  Administered 2015-08-21 – 2015-08-22 (×3): 50 mg via ORAL
  Filled 2015-08-21 (×3): qty 1

## 2015-08-21 NOTE — Plan of Care (Signed)
Problem: Safety: Goal: Ability to remain free from injury will improve Outcome: Progressing Pt ambulated in room tolerated fair, resting in chair. Awaiting echo and UA to be collected. pt asleep HR mid 40s, cardiology aware. Troponins trending down. Will continue to monitor. Family updated.

## 2015-08-21 NOTE — Progress Notes (Signed)
Cardiology on call MD, Dr. Jeannine Boga, aware that patient's troponin increased to 0.51. Patient is sleeping; no complaints of chest pain. Will continue to monitor.

## 2015-08-21 NOTE — Progress Notes (Signed)
Subjective:  Still complaining of chest pain when she twists her body or moves around.  Objective:  Vital Signs in the last 24 hours: BP 137/42 mmHg  Pulse 63  Temp(Src) 99.4 F (37.4 C) (Oral)  Resp 22  Ht 5\' 2"  (1.575 m)  Wt 89.223 kg (196 lb 11.2 oz)  BMI 35.97 kg/m2  SpO2 93%  Physical Exam:  Pleasant obese white female lying in bed in no acute distress  Chest wall: Significantly tender to palpation across the sternum and the ribs  Lungs:  Clear Cardiac:  Regular rhythm, normal S1 and S2, no S3 Abdomen:  Soft, nontender, no masses Extremities:  No edema present  Intake/Output from previous day: 06/30 0701 - 07/01 0700 In: 720 [P.O.:720] Out: 500 [Urine:500]  Weight Filed Weights   08/20/15 1531 08/21/15 0250  Weight: 89.404 kg (197 lb 1.6 oz) 89.223 kg (196 lb 11.2 oz)    Lab Results: Basic Metabolic Panel:  Recent Labs  08/20/15 1817 08/21/15 0536  NA 140 141  K 4.4 4.3  CL 108 107  CO2 24 26  GLUCOSE 224* 205*  BUN 48* 46*  CREATININE 1.36* 1.44*   CBC:  Recent Labs  08/20/15 1817  WBC 6.9  NEUTROABS 5.3  HGB 9.2*  HCT 29.1*  MCV 95.7  PLT 195   Cardiac Panel (last 3 results)  Recent Labs  08/20/15 1817 08/20/15 2339 08/21/15 0536  TROPONINI 0.35* 0.51* 0.48*    Telemetry: Sinus with frequent PACs  EKG incomplete right bundle branch block, no ischemic changes   Assessment/Plan:  1.  Clinical chest pain is atypical for ischemia associated with chest wall tenderness 2.  Elevation of troponin nonspecific somewhat low level and flat 3.  Hypertensive heart disease 4.  Stage III chronic kidney disease 5.  Mild anemia  Recommendations:  Reason for hospitalization originally was a bloody knee and Coumadin has been on hold.  The chest pain is atypical by description with no EKG changes but flat troponin elevations.  Await echocardiogram.  Continue to cycle enzymes.  Repeat EKG in the morning.  She required transient dialysis in the  past for renal failure and may be best just to manage her conservatively.  May need to get orthopedics involved with knee.     Kerry Hough  MD [facility] Cardiology  08/21/2015, 9:12 AM

## 2015-08-21 NOTE — Progress Notes (Addendum)
At 2205 patient's HR dropped to 39; nonsustained. Patient's HR dipped to 39 four times between 2205 and 2210. Patient asymptomatic at this time; awake and resting comfortably in chair. Patient rhythm was sinus bradycardia with PACs. Currently patient is NSR with HR in 60s. Will continue to monitor.

## 2015-08-21 NOTE — Progress Notes (Signed)
Attempted to call [facility] requested by Pt regarding medication.  States she uses a cream on her foot daily. Called pharmacy, not on home med list. Left message and number at [facility].

## 2015-08-22 ENCOUNTER — Observation Stay (HOSPITAL_BASED_OUTPATIENT_CLINIC_OR_DEPARTMENT_OTHER): Payer: Medicare Other

## 2015-08-22 DIAGNOSIS — M19042 Primary osteoarthritis, left hand: Secondary | ICD-10-CM | POA: Diagnosis not present

## 2015-08-22 DIAGNOSIS — R079 Chest pain, unspecified: Secondary | ICD-10-CM | POA: Diagnosis not present

## 2015-08-22 DIAGNOSIS — D649 Anemia, unspecified: Secondary | ICD-10-CM | POA: Diagnosis not present

## 2015-08-22 DIAGNOSIS — N183 Chronic kidney disease, stage 3 (moderate): Secondary | ICD-10-CM | POA: Diagnosis not present

## 2015-08-22 DIAGNOSIS — N179 Acute kidney failure, unspecified: Secondary | ICD-10-CM | POA: Diagnosis not present

## 2015-08-22 DIAGNOSIS — I5032 Chronic diastolic (congestive) heart failure: Secondary | ICD-10-CM | POA: Diagnosis not present

## 2015-08-22 DIAGNOSIS — I130 Hypertensive heart and chronic kidney disease with heart failure and stage 1 through stage 4 chronic kidney disease, or unspecified chronic kidney disease: Secondary | ICD-10-CM | POA: Diagnosis not present

## 2015-08-22 DIAGNOSIS — J449 Chronic obstructive pulmonary disease, unspecified: Secondary | ICD-10-CM | POA: Diagnosis not present

## 2015-08-22 DIAGNOSIS — I495 Sick sinus syndrome: Secondary | ICD-10-CM | POA: Diagnosis not present

## 2015-08-22 DIAGNOSIS — E1122 Type 2 diabetes mellitus with diabetic chronic kidney disease: Secondary | ICD-10-CM | POA: Diagnosis not present

## 2015-08-22 DIAGNOSIS — M25062 Hemarthrosis, left knee: Secondary | ICD-10-CM | POA: Diagnosis not present

## 2015-08-22 DIAGNOSIS — R0789 Other chest pain: Secondary | ICD-10-CM | POA: Diagnosis not present

## 2015-08-22 DIAGNOSIS — I480 Paroxysmal atrial fibrillation: Secondary | ICD-10-CM | POA: Diagnosis not present

## 2015-08-22 LAB — ECHOCARDIOGRAM COMPLETE
E decel time: 239 msec
E/e' ratio: 14.02
FS: 23 % — AB (ref 28–44)
Height: 62 in
IVS/LV PW RATIO, ED: 1.31
LA ID, A-P, ES: 43 mm
LA diam end sys: 43 mm
LA diam index: 2.29 cm/m2
LA vol A4C: 93.6 ml
LA vol index: 45.7 mL/m2
LA vol: 86 mL
LV E/e' medial: 14.02
LV E/e'average: 14.02
LV PW d: 12.4 mm — AB (ref 0.6–1.1)
LV dias vol index: 76 mL/m2
LV dias vol: 143 mL — AB (ref 46–106)
LV e' LATERAL: 8.7 cm/s
LV sys vol index: 31 mL/m2
LV sys vol: 58 mL — AB (ref 14–42)
LVOT SV: 93 mL
LVOT VTI: 26.9 cm
LVOT area: 3.46 cm2
LVOT diameter: 21 mm
LVOT peak vel: 96.9 cm/s
MV Dec: 239
MV Peak grad: 6 mmHg
MV pk A vel: 108 m/s
MV pk E vel: 122 m/s
Reg peak vel: 339 cm/s
Simpson's disk: 60
Stroke v: 85 ml
TAPSE: 17.5 mm
TDI e' lateral: 8.7
TDI e' medial: 4.13
TR max vel: 339 cm/s
Weight: 3057.6 oz

## 2015-08-22 LAB — BASIC METABOLIC PANEL
Anion gap: 5 (ref 5–15)
BUN: 67 mg/dL — AB (ref 6–20)
CHLORIDE: 107 mmol/L (ref 101–111)
CO2: 26 mmol/L (ref 22–32)
CREATININE: 1.99 mg/dL — AB (ref 0.44–1.00)
Calcium: 8 mg/dL — ABNORMAL LOW (ref 8.9–10.3)
GFR calc Af Amer: 27 mL/min — ABNORMAL LOW (ref >60)
GFR calc non Af Amer: 23 mL/min — ABNORMAL LOW (ref >60)
GLUCOSE: 121 mg/dL — AB (ref 65–99)
Potassium: 4.6 mmol/L (ref 3.5–5.1)
SODIUM: 138 mmol/L (ref 135–145)

## 2015-08-22 LAB — GLUCOSE, CAPILLARY
GLUCOSE-CAPILLARY: 242 mg/dL — AB (ref 65–99)
GLUCOSE-CAPILLARY: 236 mg/dL — AB (ref 65–99)
Glucose-Capillary: 137 mg/dL — ABNORMAL HIGH (ref 65–99)
Glucose-Capillary: 195 mg/dL — ABNORMAL HIGH (ref 65–99)

## 2015-08-22 MED ORDER — METOPROLOL TARTRATE 25 MG PO TABS
25.0000 mg | ORAL_TABLET | Freq: Two times a day (BID) | ORAL | Status: DC
  Administered 2015-08-23: 25 mg via ORAL
  Filled 2015-08-22: qty 1

## 2015-08-22 MED ORDER — SILVER SULFADIAZINE 1 % EX CREA
TOPICAL_CREAM | Freq: Every day | CUTANEOUS | Status: DC
  Administered 2015-08-22 – 2015-08-23 (×2): via TOPICAL
  Filled 2015-08-22: qty 85

## 2015-08-22 MED ORDER — PERFLUTREN LIPID MICROSPHERE
1.0000 mL | INTRAVENOUS | Status: AC | PRN
  Administered 2015-08-22: 2 mL via INTRAVENOUS
  Filled 2015-08-22: qty 10

## 2015-08-22 MED ORDER — HYDRALAZINE HCL 25 MG PO TABS
25.0000 mg | ORAL_TABLET | Freq: Three times a day (TID) | ORAL | Status: DC
  Administered 2015-08-22 – 2015-08-23 (×3): 25 mg via ORAL
  Filled 2015-08-22 (×4): qty 1

## 2015-08-22 NOTE — Progress Notes (Signed)
BP is remaining low hydralazine held and BB with parameters.

## 2015-08-22 NOTE — Progress Notes (Signed)
  Echocardiogram 2D Echocardiogram with definity has been performed.  Haley Cooper M 08/22/2015, 1:40 PM

## 2015-08-22 NOTE — Progress Notes (Signed)
Subjective:  She still has significant chest wall tenderness.  It is particularly pronounced if she moves her body or denser twists in a certain position and also was tender to touch.  She was ambulatory in the hall yesterday without exacerbation of chest pain.  Had some bradycardia last night and her metoprolol was held.  She is sitting up in the bedside today without any complaints except if she moves in the chair she will get chest pain.  Creatinine has gone up some today.  Objective:  Vital Signs in the last 24 hours: BP 123/34 mmHg  Pulse 55  Temp(Src) 98.2 F (36.8 C) (Oral)  Resp 12  Ht 5\' 2"  (1.575 m)  Wt 86.682 kg (191 lb 1.6 oz)  BMI 34.94 kg/m2  SpO2 99%  Physical Exam:  Pleasant obese white female sitting in chair in no acute distress  Chest wall: Significantly tender to palpation across the sternum and the ribs  Lungs:  Clear Cardiac:  Regular rhythm, normal S1 and S2, no S3 Extremities:  No edema present  Intake/Output from previous day: 07/01 0701 - 07/02 0700 In: 960 [P.O.:960] Out: 400 [Urine:400]  Weight Filed Weights   08/20/15 1531 08/21/15 0250 08/22/15 0429  Weight: 89.404 kg (197 lb 1.6 oz) 89.223 kg (196 lb 11.2 oz) 86.682 kg (191 lb 1.6 oz)    Lab Results: Basic Metabolic Panel:  Recent Labs  08/21/15 0536 08/22/15 0338  NA 141 138  K 4.3 4.6  CL 107 107  CO2 26 26  GLUCOSE 205* 121*  BUN 46* 67*  CREATININE 1.44* 1.99*   CBC:  Recent Labs  08/20/15 1817  WBC 6.9  NEUTROABS 5.3  HGB 9.2*  HCT 29.1*  MCV 95.7  PLT 195   Cardiac Panel (last 3 results)  Recent Labs  08/20/15 1817 08/20/15 2339 08/21/15 0536  TROPONINI 0.35* 0.51* 0.48*    Telemetry: Sinus with frequent PACs  EKG incomplete right bundle branch block, no ischemic changes   Assessment/Plan:  1.  Clinical chest pain is atypical for ischemia associated with chest wall tenderness 2.  Elevation of troponin nonspecific somewhat low level and flat 3.   Hypertensive heart disease 4.  Stage III chronic kidney disease 5.  Mild anemia 6.  History of paroxysmal atrial fibrillation 7.  Tachycardia-bradycardia syndrome  Recommendations:  Reason for hospitalization originally was a bloody knee and Coumadin has been on hold.  Her pain is clearly musculoskeletal and reproduced by palpation.  No acute EKG changes.  With her prior history of recurrent transient dialysis and hesitant to do catheterization.  In light of the clinical presentation don't really see a role for nuclear testing here.  She was ambulatory in the hall without problems.  At this point to reduce her metoprolol.  Hold furosemide because of elevation of creatinine.   Kerry Hough  MD [facility] Cardiology  08/22/2015, 9:51 AM

## 2015-08-22 NOTE — Plan of Care (Signed)
Problem: Safety: Goal: Ability to remain free from injury will improve Outcome: Progressing Pt ambulated in room 25 ft with front wheel walker. Tolerated fair, continues to have chest "soreness", only with movement. Metoprolol decreased to 25mg  bid,  Lisinopril and lasix DC'd this am, pt educated on medication changes . Nitro patch re-applied, tylenol given for heachache. Family updated, granddaughter at bedside. Echo complete awaiting results. Pt resting comfortably in chair, will continue to monitor.

## 2015-08-22 NOTE — Progress Notes (Addendum)
Patient's BP is low; initially 89/28 with recheck values of 105/35 and 98/39. Patient heart rate is sinus brady in the 40s-50s occasionally dropping to the high 30s. Patient is asymptomatic. Nitro paste was removed from patient's chest due to low BP. Dr. Eula Fried notified of above.

## 2015-08-22 NOTE — Progress Notes (Addendum)
Haley Cooper made aware of pts HR, BP overnight awaiting MD to round regarding pt receiving am dose of metoprolol 50 mg, BP in mid 90/40s and HR 40s-50 overnight, Nitropaste removed. Complaints of chest soreness with movement, otherwise no pain. Currently BP 111/57 HR 55. Questioning lisinopril and 40 po lasix, creatinine up 1.99 this am. Lungs diminished, sating 97% on 2L. Pt resting comfortably, will continue to monitor.

## 2015-08-22 NOTE — Progress Notes (Signed)
BP 91/57, HR 57, Hydralazine 25 mg held.

## 2015-08-23 ENCOUNTER — Observation Stay (HOSPITAL_COMMUNITY): Payer: Medicare Other

## 2015-08-23 DIAGNOSIS — M25062 Hemarthrosis, left knee: Secondary | ICD-10-CM | POA: Diagnosis not present

## 2015-08-23 DIAGNOSIS — E1122 Type 2 diabetes mellitus with diabetic chronic kidney disease: Secondary | ICD-10-CM | POA: Diagnosis not present

## 2015-08-23 DIAGNOSIS — I130 Hypertensive heart and chronic kidney disease with heart failure and stage 1 through stage 4 chronic kidney disease, or unspecified chronic kidney disease: Secondary | ICD-10-CM | POA: Diagnosis not present

## 2015-08-23 DIAGNOSIS — J449 Chronic obstructive pulmonary disease, unspecified: Secondary | ICD-10-CM | POA: Diagnosis not present

## 2015-08-23 DIAGNOSIS — N183 Chronic kidney disease, stage 3 (moderate): Secondary | ICD-10-CM | POA: Diagnosis not present

## 2015-08-23 DIAGNOSIS — I480 Paroxysmal atrial fibrillation: Secondary | ICD-10-CM | POA: Diagnosis not present

## 2015-08-23 DIAGNOSIS — M19042 Primary osteoarthritis, left hand: Secondary | ICD-10-CM | POA: Diagnosis not present

## 2015-08-23 DIAGNOSIS — R0789 Other chest pain: Principal | ICD-10-CM

## 2015-08-23 DIAGNOSIS — I10 Essential (primary) hypertension: Secondary | ICD-10-CM | POA: Diagnosis not present

## 2015-08-23 DIAGNOSIS — Z7901 Long term (current) use of anticoagulants: Secondary | ICD-10-CM | POA: Diagnosis not present

## 2015-08-23 DIAGNOSIS — R079 Chest pain, unspecified: Secondary | ICD-10-CM | POA: Diagnosis not present

## 2015-08-23 DIAGNOSIS — Z87891 Personal history of nicotine dependence: Secondary | ICD-10-CM | POA: Diagnosis not present

## 2015-08-23 DIAGNOSIS — N179 Acute kidney failure, unspecified: Secondary | ICD-10-CM | POA: Diagnosis not present

## 2015-08-23 DIAGNOSIS — M19041 Primary osteoarthritis, right hand: Secondary | ICD-10-CM | POA: Diagnosis present

## 2015-08-23 DIAGNOSIS — Z794 Long term (current) use of insulin: Secondary | ICD-10-CM | POA: Diagnosis not present

## 2015-08-23 DIAGNOSIS — I5032 Chronic diastolic (congestive) heart failure: Secondary | ICD-10-CM | POA: Diagnosis not present

## 2015-08-23 DIAGNOSIS — D649 Anemia, unspecified: Secondary | ICD-10-CM | POA: Diagnosis not present

## 2015-08-23 DIAGNOSIS — I495 Sick sinus syndrome: Secondary | ICD-10-CM | POA: Diagnosis not present

## 2015-08-23 LAB — GLUCOSE, CAPILLARY
GLUCOSE-CAPILLARY: 149 mg/dL — AB (ref 65–99)
GLUCOSE-CAPILLARY: 113 mg/dL — AB (ref 65–99)
Glucose-Capillary: 217 mg/dL — ABNORMAL HIGH (ref 65–99)

## 2015-08-23 LAB — BASIC METABOLIC PANEL
ANION GAP: 3 — AB (ref 5–15)
BUN: 67 mg/dL — AB (ref 6–20)
CHLORIDE: 110 mmol/L (ref 101–111)
CO2: 26 mmol/L (ref 22–32)
Calcium: 8.4 mg/dL — ABNORMAL LOW (ref 8.9–10.3)
Creatinine, Ser: 1.43 mg/dL — ABNORMAL HIGH (ref 0.44–1.00)
GFR calc Af Amer: 40 mL/min — ABNORMAL LOW (ref >60)
GFR calc non Af Amer: 35 mL/min — ABNORMAL LOW (ref >60)
GLUCOSE: 231 mg/dL — AB (ref 65–99)
POTASSIUM: 5 mmol/L (ref 3.5–5.1)
Sodium: 139 mmol/L (ref 135–145)

## 2015-08-23 LAB — HEMOGLOBIN A1C
HEMOGLOBIN A1C: 6.6 % — AB (ref 4.8–5.6)
MEAN PLASMA GLUCOSE: 143 mg/dL

## 2015-08-23 MED ORDER — WARFARIN SODIUM 7.5 MG PO TABS: 7.5000 mg | ORAL_TABLET | Freq: Every day | ORAL | Status: DC

## 2015-08-23 MED ORDER — FUROSEMIDE 40 MG PO TABS: 40.0000 mg | ORAL_TABLET | Freq: Every day | ORAL | Status: DC

## 2015-08-23 MED ORDER — SODIUM CHLORIDE 0.9 % IV BOLUS (SEPSIS)
250.0000 mL | Freq: Once | INTRAVENOUS | Status: AC
  Administered 2015-08-23: 250 mL via INTRAVENOUS

## 2015-08-23 MED ORDER — WARFARIN - PHARMACIST DOSING INPATIENT
Freq: Every day | Status: DC
Start: 2015-08-23 — End: 2015-08-23

## 2015-08-23 MED ORDER — WARFARIN SODIUM 10 MG PO TABS
10.0000 mg | ORAL_TABLET | Freq: Once | ORAL | Status: AC
  Administered 2015-08-23: 10 mg via ORAL
  Filled 2015-08-23: qty 1

## 2015-08-23 MED ORDER — GABAPENTIN 300 MG PO CAPS: 300.0000 mg | ORAL_CAPSULE | Freq: Two times a day (BID) | ORAL | Status: DC

## 2015-08-23 NOTE — Clinical Social Work Note (Signed)
Patient discharging to her group home today. CSW signing off. Information provided to the facility by RN.   Liz Beach MSW, [place], [place], [phone]

## 2015-08-23 NOTE — Discharge Instructions (Signed)

## 2015-08-23 NOTE — Progress Notes (Signed)
.     Discussed with [facility] about restarting the patient's home Coumadin for a-fib. Instructed if needed then, ok to restart from standpoint, monitor for signs of bleeding from hemarthrosis site. Will add compression wrap to the left knee.   Reino Bellis, NP-C

## 2015-08-23 NOTE — Progress Notes (Signed)
Updates given to group home. RN will administer coumadin dose prior to pt leaving. Pt discharged home. Discharge instructions have been included with pt belongings. IV's removed. Pt given unit number and told to call if they have any concerns regarding their discharge instructions. Etta Quill, RN

## 2015-08-23 NOTE — Discharge Summary (Signed)
Discharge Summary    Patient ID: RHODES BORUNDA,  MRN: [id number], DOB/AGE: Jun 03, 1940 75 y.o.  Admit date: 08/20/2015 Discharge date: 08/23/2015  Primary Care Provider: Glenda Chroman Primary Cardiologist: Dr. Bronson Ing  Discharge Diagnoses    Active Problems:   Chest pain   DM2 (diabetes mellitus, type 2) ([place])   Chronic diastolic CHF (congestive heart failure), NYHA class 2 (HCC)   CKD (chronic kidney disease), stage III   Atrial fibrillation ([place])   Essential hypertension   Allergies No Known Allergies  Diagnostic Studies/Procedures    ECHO: 08/22/2015  Study Conclusions  - Procedure narrative: Transthoracic echocardiography. Image  quality was poor. The study was technically difficult, as a  result of restricted patient mobility. Intravenous contrast  (Definity) was administered. - Left ventricle: The cavity size was normal. Wall thickness was  increased in a pattern of moderate LVH. Systolic function was  normal. The estimated ejection fraction was in the range of 60%  to 65%. Although no diagnostic regional wall motion abnormality  was identified, this possibility cannot be completely excluded on  the basis of this study. Doppler parameters are consistent with  pseudonormal left ventricular relaxation (grade 2 diastolic  dysfunction). The E/e&' ratio is >15, suggesting elevated LV  filling pressure. - Aortic valve: Trileaflet. Sclerosis without stenosis. There was  no regurgitation. - Mitral valve: Calcified annulus. Mildly thickened leaflets .  There was trivial regurgitation. - Left atrium: Severely dilated. - Right ventricle: The cavity size was mildly dilated. - Right atrium: The atrium was mildly dilated. - Tricuspid valve: There was moderate regurgitation. - Pulmonary arteries: PA peak pressure: 61 mm Hg (S). - Inferior vena cava: The vessel was dilated. The respirophasic  diameter changes were blunted (< 50%), consistent with elevated   central venous pressure.  Impressions:  - Compared to a prior echo in 2015, the LVEF is higher at 60-65%.  There is now moderate LVH and severe LAE. Left and right heart  filling pressures are higher. _____________   History of Present Illness     Ms. Tallarico is a 75 year old female patient of Dr. Bronson Ing with past medical history of Chronic diastolic CHF/HTN/ DM II/CKD/A-fib (on Coumadin). She was last seen in the office on 12/30/2014 for routine follow-up at that time she was noted to be in her normal state of health without chest pain or palpitations. The plan at that time was to continue on warfarin and other medications, along with Lasix 40 mg twice a day. Last 2-D echo from 04/2013 showed an EF of 0000000, grade 2 diastolic dysfunction with mildly elevated left atrium and peak PA pressures of 44 mmHg.   Ms. Delahoussaye [facility] [place]. He was transferred to [facility] with complaints of left knee pain, in the ED and arthrocentesis was done by the ER physician with bloody drainage noted but no infection. She was admitted due to difficulty ambulating with plans to have orthopedics evaluate her during her inpatient stay. According to staff she reported centralized chest pain this morning while attempting to move around in the bed. A single troponin was drawn and noted to be 0.12. EKG from facility shows sinus rhythm with no acute ST/T-wave abnormalities, but nonspecific interventricular conduction delay. Does have a known history of atrial fibrillation, but not noted on EKG. The patient was transferred to [facility] to a stepdown bed after discussion with Dr. Claiborne Billings for admission for possible NSTEMI.  In  talking with the patient she reports she has had centralized chest pain since Wednesday when she was admitted to the hospital and [place]. Reports this pain is only present when she is transitioning in the bed or attempting to get out  of bed, normally ambulatory around her home facility without any reports of chest pain or dyspnea on exertion. Pain is not currently reproducible with palpation. Patient reports she was given one sublingual nitroglycerin while at [facility] regional earlier today which did relieve her chest pain. She denies any associated shortness of breath, nausea vomiting, cough, dizziness, lightheaded, palpitations or diaphoresis. She is currently pain-free, and resting comfortably. Of note she was hypertensive on arrival to the unit with systolic pressure of [id number], and temperature of 99.6.   Hospital Course     Consultants: None  Ms. Callicoat was admitted to our service on 08/20/2015. On admission the decision was made to discontinue her low-dose Cardizem and initiate metoprolol tartrate that 50 mg twice a day. Her Coumadin had been held at [facility] given she underwent removal of blood from her left knee after a hemarthrosis. Within was held on admission and started on daily 81 mg aspirin and subcutaneous heparin. She was noted to be in a sinus rhythm with PACs. Given her renal insufficiency and admission creatinine of 1.5 her lisinopril was decreased from 20 mg to 10 mg. She was continued on her home dose of hydralazine 25 mg 3 times a day, and her Lasix dose was decreased from 40 mg twice a day to daily. A 2-D echo was ordered to reevaluate both her systolic and diastolic function as well as her systolic murmur. Given she had a low-grade fever on admission a CBC was checked and noted to have a normal white count and stable hemoglobin of 9.2 which is around her current baseline. Her troponin levels were cycled and peaked at 0.51. The next day following admission she was ambulatory in the hallway without any exacerbation of her chest pain, and did have some noted bradycardia during the night and her metoprolol was held. She did continue to complain of chest wall pain, and had a slight increase in her creatinine  level. Given her bradycardia of the metoprolol was reduced to 25 mg twice a day, and daily Lasix was held given her elevation in creatinine. She also did have some hypotension during admission and were ultimately her lisinopril was held. Repeat EKG during admission showed known incomplete right bundle branch block with no ischemic changes. She did undergo a 2-D echocardiogram which showed an improved EF of 60-65% with moderate LVH/LAE, G2DD, and PA pressure of 14mm Hg. the decision was made to forego any type of further cardiac intervention given her reproducible chest pain and atypical features along with an elevated creatinine level and history of transient dialysis in the past.   On 08/23/2015, a chest x-ray was ordered given her continued complaints of chest wall pain which was negative for any acute process. Her case was briefly discussed with orthopedics in regards to restarting Coumadin dosing for her known A. fib. At that time it was felt to be clear to restart her Coumadin dosing with plans to monitor for signs of bleeding from the left knee given she had hemarthrosis at [facility]. A compression Ace bandage was placed on left knee per their recommendations. Her Coumadin was restarted at 10 mg daily via pharmacy dosing, with recommendations to continue her on her home dose of 7.5 mg daily was scheduled follow-up. Given she  still continued to have episodes of bradycardia her metoprolol was stopped altogether, she was given a 250 on normal saline bolus with improvement in creatinine level from 1.9-1.4. She was seen and assessed by Dr. Marlou Porch and determined appropriate for discharge back to her group home in [place]. I have arranged for follow-up for her in the office in [place] in [redacted] weeks along with a follow-up in the Coumadin clinic this coming Thursday. Have discussed her Coumadin dosing with pharmacy who felt appropriate to give 1 dose of 10 mg today and then continue her on her home dose of 7.5 mg daily. Her  vital signs and labs were stable at the time of discharge. Will need follow up BMP at office visit.  _____________  Discharge Vitals Blood pressure 120/38, pulse 54, temperature 98.6 F (37 C), temperature source Oral, resp. rate 19, height 5\' 2"  (1.575 m), weight 197 lb 11.2 oz (89.676 kg), SpO2 97 %.  Filed Weights   08/21/15 0250 08/22/15 0429 08/23/15 0500  Weight: 196 lb 11.2 oz (89.223 kg) 191 lb 1.6 oz (86.682 kg) 197 lb 11.2 oz (89.676 kg)    Labs & Radiologic Studies    CBC  Recent Labs  08/20/15 1817  WBC 6.9  NEUTROABS 5.3  HGB 9.2*  HCT 29.1*  MCV 95.7  PLT 0000000   Basic Metabolic Panel  Recent Labs  08/22/15 0338 08/23/15 1010  NA 138 139  K 4.6 5.0  CL 107 110  CO2 26 26  GLUCOSE 121* 231*  BUN 67* 67*  CREATININE 1.99* 1.43*  CALCIUM 8.0* 8.4*   Liver Function Tests  Recent Labs  08/20/15 1817  AST 15  ALT 14  ALKPHOS 48  BILITOT 1.0  PROT 6.7  ALBUMIN 3.2*   No results for input(s): LIPASE, AMYLASE in the last 72 hours. Cardiac Enzymes  Recent Labs  08/20/15 1817 08/20/15 2339 08/21/15 0536  TROPONINI 0.35* 0.51* 0.48*   BNP Invalid input(s): POCBNP D-Dimer No results for input(s): DDIMER in the last 72 hours. Hemoglobin A1C  Recent Labs  08/21/15 0536  HGBA1C 6.6*   Fasting Lipid Panel  Recent Labs  08/21/15 0536  CHOL 129  HDL 35*  LDLCALC 74  TRIG 99  CHOLHDL 3.7   Thyroid Function Tests No results for input(s): TSH, T4TOTAL, T3FREE, THYROIDAB in the last 72 hours.  Invalid input(s): FREET3 _____________  Dg Chest 2 View  08/23/2015  CLINICAL DATA:  Chest pain EXAM: CHEST  2 VIEW COMPARISON:  04/26/2013 chest radiograph. FINDINGS: Stable cardiomediastinal silhouette with mild cardiomegaly and symmetric hilar prominence. No pneumothorax. Trace bilateral pleural effusions. Mild pulmonary edema. No consolidative airspace disease. IMPRESSION: 1. Mild congestive heart failure. 2. Trace bilateral pleural effusions. 3.  Stable symmetric hilar prominence, probably due to distended pulmonary vasculature. Electronically Signed   By: Ilona Sorrel M.D.   On: 08/23/2015 10:21   Disposition   Pt is being discharged home today in good condition.  Follow-up Plans & Appointments    Follow-up Information    Follow up with Kate Sable, MD On 09/07/2015.   Specialty:  Cardiology   Why:  11:20am for your hospital follow up   Contact information:   [place] [address] [phone]       Follow up with [facility] On 08/26/2015.   Specialty:  Cardiology   Why:  12:30pm at the Coumadin Clinic   Contact information:   [address]  [place] [phone]     Discharge Instructions    Diet - low sodium heart healthy    Complete by:  As directed      Discharge instructions    Complete by:  As directed   For patients with congestive heart failure, we give them these special instructions:  1. Follow a low-salt diet and watch your fluid intake. In general, you should not be taking in more than 2 liters of fluid per day (no more than 8 glasses per day). Some patients are restricted to less than 1.5 liters of fluid per day (no more than 6 glasses per day). This includes sources of water in foods like soup, coffee, tea, milk, etc. 2. Weigh yourself on the same scale at same time of day and keep a log. 3. Call your doctor: (Anytime you feel any of the following symptoms)  - 3-4 pound weight gain in 1-2 days or 2 pounds overnight  - Shortness of breath, with or without a dry hacking cough  - Swelling in the hands, feet or stomach  - If you have to sleep on extra pillows at night in order to breathe   IT IS IMPORTANT TO LET YOUR DOCTOR KNOW EARLY ON IF YOU ARE HAVING SYMPTOMS SO WE CAN HELP YOU!     Increase activity slowly    Complete by:  As directed            Discharge Medications   Current Discharge Medication List      CONTINUE these medications which have CHANGED   Details  furosemide (LASIX) 40 MG tablet Take 1 tablet (40 mg total) by mouth daily. Qty: 30 tablet, Refills: 6    gabapentin (NEURONTIN) 300 MG capsule Take 1 capsule (300 mg total) by mouth 2 (two) times daily. Qty: 60 capsule, Refills: 6    warfarin (COUMADIN) 7.5 MG tablet Take 1 tablet (7.5 mg total) by mouth at bedtime. or as directed by coumadin clinic Qty: 30 tablet, Refills: 3      CONTINUE these medications which have NOT CHANGED   Details  atorvastatin (LIPITOR) 20 MG tablet Take 20 mg by mouth at bedtime.     hydrALAZINE (APRESOLINE) 25 MG tablet Take 1 tablet (25 mg total) by mouth 3 (three) times daily. Qty: 90 tablet, Refills: 6    insulin glargine (LANTUS) 100 UNIT/ML injection Inject 0.12 mLs (12 Units total) into the skin every morning. Qty: 10 mL, Refills: 11    insulin lispro (HUMALOG KWIKPEN) 100 UNIT/ML KiwkPen Inject 6 Units into the skin daily before supper.    iron polysaccharides (NIFEREX) 150 MG capsule Take 150 mg by mouth daily.      STOP taking these medications     lisinopril (PRINIVIL,ZESTRIL) 20 MG tablet           Outstanding Labs/Studies   Will need f/u BMET at office visit  Duration of Discharge Encounter   Greater than 30 minutes including physician time.  Signed, Reino Bellis NP-C 08/23/2015, 1:39 PM   Personally seen and examined. Agree with above.  75 year old with mildly elevated troponin, normal D-Dimer, MSK like chest pain, AFIB, with bradycardia.    - Stopped all AV - nodal blocking agents. May need low dose diltiazem again.   - Resumed coumadin - knee brace. Avoiding concomitant ASA.  OK with DC home.  Candee Furbish, MD

## 2015-08-23 NOTE — Progress Notes (Signed)
ANTICOAGULATION CONSULT NOTE - Initial Consult  Pharmacy Consult:  Coumadin Indication: atrial fibrillation  No Known Allergies  Patient Measurements: Height: 5\' 2"  (157.5 cm) Weight: 197 lb 11.2 oz (89.676 kg) IBW/kg (Calculated) : 50.1  Vital Signs: Temp: 98.6 F (37 C) (07/03 0500) Temp Source: Oral (07/03 0500) BP: 120/38 mmHg (07/03 0840) Pulse Rate: 54 (07/03 0840)  Labs:  Recent Labs  08/20/15 1817 08/20/15 1945 08/20/15 2339 08/21/15 0536 08/22/15 0338  HGB 9.2*  --   --   --   --   HCT 29.1*  --   --   --   --   PLT 195  --   --   --   --   LABPROT  --  18.6*  --   --   --   INR  --  1.55*  --   --   --   CREATININE 1.36*  --   --  1.44* 1.99*  TROPONINI 0.35*  --  0.51* 0.48*  --     Estimated Creatinine Clearance: 25.4 mL/min (by C-G formula based on Cr of 1.99).   Medical History: Past Medical History  Diagnosis Date  . DM2 (diabetes mellitus, type 2) ([place])   . COPD (chronic obstructive pulmonary disease) ([place])   . HTN (hypertension)   . CHF (congestive heart failure) ([place])   . Shortness of breath   . Chronic kidney disease 04/2013   . Arthritis     HANDS  . Anemia   . Paroxysmal atrial fibrillation (HCC)       Assessment: 30 YOF presented to [facility] with left knee pain and underwent arthrocentesis in the ED that yielded bloody drainage.  Patient also complained of chest pain in setting of elevated troponin; therefore, she was transferred to [facility].  Of note, patient was on Coumadin PTA for history of AFib that was placed on hold.  Given no plan for cath currently, Pharmacy consulted to resume Coumadin.  Her INR was sub-therapeutic on admit and her last Coumadin dose was on 08/15/15 per med history.  No new INR today, but expect it to be sub-therapeutic.  No overt bleeding documented.  Home Coumadin dose:  7.5mg  PO daily    Goal of Therapy:  INR 2-3    Plan:  - Coumadin 10mg  PO today - Continue heparin SQ until INR therapeutic - Daily PT  / INR - Monitor for s/sx of bleeding from hemarthrosis site   Alexsandro Salek D. Mina Marble, PharmD, BCPS Pager:  [phone] 08/23/2015, 11:06 AM

## 2015-08-23 NOTE — Care Management Note (Signed)
Case Management Note  Patient Details  Name: NEALIE SUGALSKI MRN: [id number] Date of Birth: 08-Jul-1940  Subjective/Objective:         Pt admitted with chest pain           Action/Plan:  Pt is from group home ([facility]).  CSW following for discharge needs   Expected Discharge Date:                  Expected Discharge Plan:  Group Home  In-House Referral:  Clinical Social Work  Discharge planning Services  CM Consult  Post Acute Care Choice:    Choice offered to:     DME Arranged:    DME Agency:     HH Arranged:    HH Agency:     Status of Service:  In process, will continue to follow  If discussed at Long Length of Stay Meetings, dates discussed:    Additional Comments:  Maryclare Labrador, RN 08/23/2015, 10:54 AM

## 2015-08-23 NOTE — Progress Notes (Signed)
Pt chest is very sore with the slightest movement. RN will notfiy MD during am rounds

## 2015-08-23 NOTE — Progress Notes (Signed)
Patient Name: Haley Cooper Date of Encounter: 08/23/2015  Hospital Problem List     Active Problems:   Chest pain   DM2 (diabetes mellitus, type 2) (HCC)   Chronic diastolic CHF (congestive heart failure), NYHA class 2 (HCC)   CKD (chronic kidney disease), stage III   Atrial fibrillation (HCC)   Essential hypertension    Subjective   Still continues to report pain across her upper chest wall, worse with movement.  Inpatient Medications    . aspirin EC  81 mg Oral Daily  . atorvastatin  20 mg Oral q1800  . gabapentin  300 mg Oral TID  . heparin  5,000 Units Subcutaneous Q8H  . hydrALAZINE  25 mg Oral TID  . insulin aspart  0-15 Units Subcutaneous TID WC  . insulin glargine  12 Units Subcutaneous Daily  . metoprolol tartrate  25 mg Oral BID  . nitroGLYCERIN  0.5 inch Topical Q8H  . silver sulfADIAZINE   Topical Daily    Vital Signs    Filed Vitals:   08/23/15 0400 08/23/15 0500 08/23/15 0600 08/23/15 0700  BP:  133/48    Pulse: 53 46 47 47  Temp:  98.6 F (37 C)    TempSrc:  Oral    Resp: 11 18 14 11   Height:      Weight:  197 lb 11.2 oz (89.676 kg)    SpO2: 98% 97% 99% 96%    Intake/Output Summary (Last 24 hours) at 08/23/15 0856 Last data filed at 08/23/15 0804  Gross per 24 hour  Intake    720 ml  Output   1025 ml  Net   -305 ml   Filed Weights   08/21/15 0250 08/22/15 0429 08/23/15 0500  Weight: 196 lb 11.2 oz (89.223 kg) 191 lb 1.6 oz (86.682 kg) 197 lb 11.2 oz (89.676 kg)    Physical Exam    General: Pleasant obese older female, NAD. Neuro: Alert and oriented X 3. Moves all extremities spontaneously. Psych: Normal affect. HEENT:  Normal  Neck: Supple without bruits or JVD. Lungs:  Resp regular and unlabored, CTA. Heart: RRR no s3, s4, or murmurs. Chest wall is tender to palpation across the sternum.  Abdomen: Soft, obese, non-tender, non-distended, BS + x 4. Stable umbilical hernia Extremities: No clubbing, cyanosis or edema. DP/PT/Radials 2+  and equal bilaterally.  Labs    CBC  Recent Labs  08/20/15 1817  WBC 6.9  NEUTROABS 5.3  HGB 9.2*  HCT 29.1*  MCV 95.7  PLT 0000000   Basic Metabolic Panel  Recent Labs  08/21/15 0536 08/22/15 0338  NA 141 138  K 4.3 4.6  CL 107 107  CO2 26 26  GLUCOSE 205* 121*  BUN 46* 67*  CREATININE 1.44* 1.99*  CALCIUM 8.5* 8.0*   Liver Function Tests  Recent Labs  08/20/15 1817  AST 15  ALT 14  ALKPHOS 48  BILITOT 1.0  PROT 6.7  ALBUMIN 3.2*   No results for input(s): LIPASE, AMYLASE in the last 72 hours. Cardiac Enzymes  Recent Labs  08/20/15 1817 08/20/15 2339 08/21/15 0536  TROPONINI 0.35* 0.51* 0.48*   BNP Invalid input(s): POCBNP D-Dimer No results for input(s): DDIMER in the last 72 hours. Hemoglobin A1C  Recent Labs  08/21/15 0536  HGBA1C 6.6*   Fasting Lipid Panel  Recent Labs  08/21/15 0536  CHOL 129  HDL 35*  LDLCALC 74  TRIG 99  CHOLHDL 3.7   Thyroid Function Tests No results for  input(s): TSH, T4TOTAL, T3FREE, THYROIDAB in the last 72 hours.  Invalid input(s): FREET3  Telemetry    SR with episodes of bradycardia, PACs  ECG    08/22/2015 SB no acute changes since last tracing  Radiology    No results found.  Assessment & Plan    75 yo female with PMH of Chronic diastolic CHF/HTN/ DM II/CKD/A-fib (on Coumadin) who was transferred to [facility] from [facility] with an elevated trop and chest pain on 08/20/2015.  1. Chest wall pain: She continues to c/o of chest wall pain with movement and palpation over the weekend and this morning, which does not appear to be cardiac in nature and reproducible to palpation. Also reports pain with deep inspiration. Troponins cycled 0.35>>0.51>>0.48. Given tylenol for pain this morning without much relief, also has nitro paste in place. Pain appears to be musculoskeletal in nature, and hx of transient dialysis in the past, so concern related to doing a heart cath.  -- will check chest  x-ray  2. HTN: Hypertensive on admission, improved now -- Continue hydralazine -- Metoprolol decreased from 50mg  BID to 25mg  BID over the weekend r/t bradycardia.  3. DM II: SSI -- Lantus 12 units HS  4. CKD III: Check BMET -- Lisinopril held, and Lasix held in the setting of elevated Cr level. Check BMET this morning.  5. A-Fib: Currently in SR on telemetry --This patients CHA2DS2-VASc Score and unadjusted Ischemic Stroke Rate (% per year) is equal to 9.7 % stroke rate/year from a score of 6 Above score calculated as 1 point each if present [CHF, HTN, DM, Vascular=MI/PAD/Aortic Plaque, Age if 65-74, or Female] Above score calculated as 2 points each if present [Age > 75, or Stroke/TIA/TE] -- Hold Coumadin in the setting of left knee hemarthrosis -- Hgb stable 9.2 on admission.  6. CHF: Does not appear to be volume overloaded on exam, no dyspnea reported. -- 2 D echo showed EF improved to 60-65%, with moderate LVH/LAE, G2DD, and PA pressure of 59mm Hg.  -- Daily weights, I&Os. -- Lasix held this morning in the setting of elevated Cr. Check BMET this morning.   7. Left knee hemarthrosis: Resolved.   Signed, Reino Bellis NP-C Pager [phone]  Personally seen and examined. Agree with above.  Agree with holding lasix, ACE-I with creat rising.  Decreased metoprolol with mild bradycardia. I will stop.  Normal EF reassuring Atypical CP. No cath. D-dimer normal. ? Etiology of trop 0.5 Recent hypotension (contributing to AKI) Will give gentle bolus. 250. Holding coumadin, hemarthrosis - will have ortho take a look. Hopefully no further intervention. ?when restart coumadin.  Stop asa when restarting coumadin.    Candee Furbish, MD

## 2015-08-23 NOTE — Clinical Social Work Note (Signed)
Clinical Social Work Assessment  Patient Details  Name: Haley Cooper MRN: [id number] Date of Birth: 04/02/1940  Date of referral:  08/23/15               Reason for consult:  Facility Placement, Discharge Planning                Permission sought to share information with:  Facility Sport and exercise psychologist, Family Supports Permission granted to share information::  Yes, Verbal Permission Granted  Name::     Water engineer::  Group Home ([facility])  Relationship::  Son  Contact Information:     Housing/Transportation Living arrangements for the past 2 months:  [place] of Information:  Patient Patient Interpreter Needed:  None Criminal Activity/Legal Involvement Pertinent to Current Situation/Hospitalization:  No - Comment as needed Significant Relationships:  Adult Children Lives with:  Facility Resident Do you feel safe going back to the place where you live?  Yes Need for family participation in patient care:  No (Coment)  Care giving concerns:  The patient does not report any care giving concerns at this time.   Social Worker assessment / plan:  CSW met with the patient at bedside to complete assessment. The patient is alert and oriented x4. The patient shares that she was admitted from [facility]. She states that she is very happy at this group home and feels they take good care of her. She reports that she was previously at a group home called [facility] and has a negative experience with this facility. CSW explained that [facility] has been contacted and the group home states they can pick her up at time of her discharge. CSW met with RN and provide number for report and number to call for transport in the event the patient can DC today. Facility states they will just need a DC Summary at time of discharge.   Employment status:  Disabled (Comment on whether or not currently receiving Disability) Insurance information:  Medicare, Medicaid [place] [facility]  Recommendations:  Not assessed at this time Information / Referral to community resources:  Other (Comment Required) (DC Summary will be provided to facility at time of discharge. This is the only information they request.)  Patient/Family's Response to care:  The patient is happy with the care she has received. She is very pleasant and appreciative of CSW visit.  Patient/Family's Understanding of and Emotional Response to Diagnosis, Current Treatment, and Prognosis:  The patient has fair insight into reason for admission. She understands that she will need to return to the group home at discharge and is agreeable.   Emotional Assessment Appearance:  Appears stated age Attitude/Demeanor/Rapport:  Other (The patient is appropriate and welcoming of CSW.) Affect (typically observed):  Accepting, Appropriate, Calm, Pleasant Orientation:  Oriented to Self, Oriented to Place, Oriented to  Time, Oriented to Situation Alcohol / Substance use:  Not Applicable Psych involvement (Current and /or in the community):  No (Comment)  Discharge Needs  Concerns to be addressed:  Discharge Planning Concerns Readmission within the last 30 days:  No Current discharge risk:  None Barriers to Discharge:  Continued Medical Work up   Fredderick Phenix B, LCSW 08/23/2015, 10:12 AM

## 2015-08-26 ENCOUNTER — Ambulatory Visit: Payer: Medicare Other

## 2015-08-31 ENCOUNTER — Ambulatory Visit (INDEPENDENT_AMBULATORY_CARE_PROVIDER_SITE_OTHER): Payer: Medicare Other | Admitting: *Deleted

## 2015-08-31 DIAGNOSIS — E1140 Type 2 diabetes mellitus with diabetic neuropathy, unspecified: Secondary | ICD-10-CM | POA: Diagnosis not present

## 2015-08-31 DIAGNOSIS — E1151 Type 2 diabetes mellitus with diabetic peripheral angiopathy without gangrene: Secondary | ICD-10-CM | POA: Diagnosis not present

## 2015-08-31 DIAGNOSIS — Z5181 Encounter for therapeutic drug level monitoring: Secondary | ICD-10-CM

## 2015-08-31 DIAGNOSIS — I480 Paroxysmal atrial fibrillation: Secondary | ICD-10-CM

## 2015-08-31 DIAGNOSIS — I4891 Unspecified atrial fibrillation: Secondary | ICD-10-CM | POA: Diagnosis not present

## 2015-08-31 LAB — POCT INR: INR: 1.8

## 2015-09-07 ENCOUNTER — Ambulatory Visit (INDEPENDENT_AMBULATORY_CARE_PROVIDER_SITE_OTHER): Payer: Medicare Other | Admitting: *Deleted

## 2015-09-07 ENCOUNTER — Encounter: Payer: Self-pay | Admitting: Cardiovascular Disease

## 2015-09-07 ENCOUNTER — Ambulatory Visit (INDEPENDENT_AMBULATORY_CARE_PROVIDER_SITE_OTHER): Payer: Medicare Other | Admitting: Cardiovascular Disease

## 2015-09-07 VITALS — BP 179/73 | HR 61 | Ht 59.0 in | Wt 201.0 lb

## 2015-09-07 DIAGNOSIS — I10 Essential (primary) hypertension: Secondary | ICD-10-CM

## 2015-09-07 DIAGNOSIS — I5032 Chronic diastolic (congestive) heart failure: Secondary | ICD-10-CM

## 2015-09-07 DIAGNOSIS — I4891 Unspecified atrial fibrillation: Secondary | ICD-10-CM | POA: Diagnosis not present

## 2015-09-07 DIAGNOSIS — Z5181 Encounter for therapeutic drug level monitoring: Secondary | ICD-10-CM | POA: Diagnosis not present

## 2015-09-07 DIAGNOSIS — I480 Paroxysmal atrial fibrillation: Secondary | ICD-10-CM | POA: Diagnosis not present

## 2015-09-07 LAB — POCT INR: INR: 2.8

## 2015-09-07 MED ORDER — HYDRALAZINE HCL 50 MG PO TABS: 50.0000 mg | ORAL_TABLET | Freq: Three times a day (TID) | ORAL | Status: DC

## 2015-09-07 NOTE — Progress Notes (Signed)
Patient ID: Haley Cooper, female   DOB: Jun 29, 1940, 75 y.o.   MRN: [id number]      SUBJECTIVE: The patient presents for routine follow-up. She was hospitalized earlier this month. She had chest pain. Echocardiogram demonstrated normal left ventricular systolic function, [id number].  Denies chest tightness. She is very worried because her daughter has cancer and she has 3 children. Said her shortness of breath is doing well and she uses 2 L of oxygen chronically.  Review of Systems: As per "subjective", otherwise negative.  No Known Allergies  Current Outpatient Prescriptions  Medication Sig Dispense Refill  . atorvastatin (LIPITOR) 20 MG tablet Take 20 mg by mouth at bedtime.     . furosemide (LASIX) 40 MG tablet Take 1 tablet (40 mg total) by mouth daily. 30 tablet 6  . gabapentin (NEURONTIN) 300 MG capsule Take 1 capsule (300 mg total) by mouth 2 (two) times daily. 60 capsule 6  . hydrALAZINE (APRESOLINE) 25 MG tablet Take 1 tablet (25 mg total) by mouth 3 (three) times daily. 90 tablet 6  . insulin glargine (LANTUS) 100 UNIT/ML injection Inject 0.12 mLs (12 Units total) into the skin every morning. (Patient taking differently: Inject 12 Units into the skin daily. ) 10 mL 11  . insulin lispro (HUMALOG KWIKPEN) 100 UNIT/ML KiwkPen Inject 6 Units into the skin daily before supper.    . iron polysaccharides (NIFEREX) 150 MG capsule Take 150 mg by mouth daily.    Marland Kitchen warfarin (COUMADIN) 7.5 MG tablet Take 1 tablet (7.5 mg total) by mouth at bedtime. or as directed by coumadin clinic 30 tablet 3   No current facility-administered medications for this visit.    Past Medical History  Diagnosis Date  . DM2 (diabetes mellitus, type 2) ([place])   . COPD (chronic obstructive pulmonary disease) ([place])   . HTN (hypertension)   . CHF (congestive heart failure) ([place])   . Shortness of breath   . Chronic kidney disease 04/2013   . Arthritis     HANDS  . Anemia   . Paroxysmal atrial fibrillation [facility])       Past Surgical History  Procedure Laterality Date  . Vein surgery      Social History   Social History  . Marital Status: Divorced    Spouse Name: N/A  . Number of Children: N/A  . Years of Education: N/A   Occupational History  . Not on file.   Social History Main Topics  . Smoking status: Former Smoker -- 1.00 packs/day for 29 years    Types: Cigarettes    Start date: 07/19/1955    Quit date: 02/21/1984  . Smokeless tobacco: Never Used  . Alcohol Use: No  . Drug Use: No  . Sexual Activity: No   Other Topics Concern  . Not on file   Social History Narrative     Filed Vitals:   09/07/15 1140  BP: 179/73  Pulse: 61  Height: 4\' 11"  (1.499 m)  Weight: 201 lb (91.173 kg)  SpO2: 98%    PHYSICAL EXAM General: NAD HEENT: Normal. Neck: No JVD, no thyromegaly. Lungs: Clear to auscultation bilaterally with normal respiratory effort. CV: Nondisplaced PMI.  Regular rate and rhythm, normal S1/S2, no S3/S4, no murmur. 1+ pitting pretibial edema. Abdomen: Soft, nontender, obese.  Neurologic: Alert and oriented.  Skin: Normal. Musculoskeletal: No gross deformities.    ECG: Most recent ECG reviewed.      ASSESSMENT AND PLAN: 1. Atrial fibrillation: Stable. Continue warfarin.  2. Chronic diastolic heart failure: Will aim to control BP. Continue Lasix 40 mg daily.  3. Essential HTN: Elevated. Increase hydralazine to 50 mg tid.  Dispo: fu 6 months.  Kate Sable, M.D., F.A.C.C.

## 2015-09-07 NOTE — Patient Instructions (Signed)
Medication Instructions:   Increase Hydralazine to 50mg  three times per day - new sent [facility] today.  Continue all other medications.    Labwork: NONE  Testing/Procedures: NONE  Follow-Up: Your physician wants you to follow up in: 6 months.  You will receive a reminder letter in the mail one-two months in advance.  If you don't receive a letter, please call our office to schedule the follow up appointment   Any Other Special Instructions Will Be Listed Below (If Applicable).  If you need a refill on your cardiac medications before your next appointment, please call your pharmacy.

## 2015-09-15 DIAGNOSIS — Z0000 Encounter for general adult medical examination without abnormal findings: Secondary | ICD-10-CM | POA: Diagnosis not present

## 2015-09-15 DIAGNOSIS — Z299 Encounter for prophylactic measures, unspecified: Secondary | ICD-10-CM | POA: Diagnosis not present

## 2015-09-15 DIAGNOSIS — E1151 Type 2 diabetes mellitus with diabetic peripheral angiopathy without gangrene: Secondary | ICD-10-CM | POA: Diagnosis not present

## 2015-09-15 DIAGNOSIS — Z1211 Encounter for screening for malignant neoplasm of colon: Secondary | ICD-10-CM | POA: Diagnosis not present

## 2015-09-15 DIAGNOSIS — E1140 Type 2 diabetes mellitus with diabetic neuropathy, unspecified: Secondary | ICD-10-CM | POA: Diagnosis not present

## 2015-09-15 DIAGNOSIS — Z1389 Encounter for screening for other disorder: Secondary | ICD-10-CM | POA: Diagnosis not present

## 2015-09-15 DIAGNOSIS — Z7189 Other specified counseling: Secondary | ICD-10-CM | POA: Diagnosis not present

## 2015-09-21 DIAGNOSIS — E119 Type 2 diabetes mellitus without complications: Secondary | ICD-10-CM | POA: Diagnosis not present

## 2015-09-21 DIAGNOSIS — Z961 Presence of intraocular lens: Secondary | ICD-10-CM | POA: Diagnosis not present

## 2015-09-21 DIAGNOSIS — H538 Other visual disturbances: Secondary | ICD-10-CM | POA: Diagnosis not present

## 2015-09-28 ENCOUNTER — Ambulatory Visit (INDEPENDENT_AMBULATORY_CARE_PROVIDER_SITE_OTHER): Payer: Medicare Other | Admitting: *Deleted

## 2015-09-28 DIAGNOSIS — Z5181 Encounter for therapeutic drug level monitoring: Secondary | ICD-10-CM

## 2015-09-28 DIAGNOSIS — I4891 Unspecified atrial fibrillation: Secondary | ICD-10-CM | POA: Diagnosis not present

## 2015-09-28 LAB — POCT INR: INR: 3.6

## 2015-10-04 ENCOUNTER — Other Ambulatory Visit: Payer: Self-pay | Admitting: *Deleted

## 2015-10-04 MED ORDER — WARFARIN SODIUM 7.5 MG PO TABS: 7.5000 mg | ORAL_TABLET | Freq: Every day | ORAL | 2 refills | Status: DC

## 2015-10-04 NOTE — Telephone Encounter (Signed)
[facility] called stating that Mrs. Candito is our of her coumdin per [place], [place], [place]    Please verify if they actually need an RX written or can it be called in.

## 2015-10-05 DIAGNOSIS — I509 Heart failure, unspecified: Secondary | ICD-10-CM | POA: Diagnosis not present

## 2015-10-05 DIAGNOSIS — Z299 Encounter for prophylactic measures, unspecified: Secondary | ICD-10-CM | POA: Diagnosis not present

## 2015-10-05 DIAGNOSIS — E1140 Type 2 diabetes mellitus with diabetic neuropathy, unspecified: Secondary | ICD-10-CM | POA: Diagnosis not present

## 2015-10-05 DIAGNOSIS — M25511 Pain in right shoulder: Secondary | ICD-10-CM | POA: Diagnosis not present

## 2015-10-19 ENCOUNTER — Ambulatory Visit (INDEPENDENT_AMBULATORY_CARE_PROVIDER_SITE_OTHER): Payer: Medicare Other | Admitting: *Deleted

## 2015-10-19 DIAGNOSIS — I4891 Unspecified atrial fibrillation: Secondary | ICD-10-CM | POA: Diagnosis not present

## 2015-10-19 DIAGNOSIS — Z5181 Encounter for therapeutic drug level monitoring: Secondary | ICD-10-CM

## 2015-10-19 LAB — POCT INR: INR: 5

## 2015-10-26 ENCOUNTER — Ambulatory Visit (INDEPENDENT_AMBULATORY_CARE_PROVIDER_SITE_OTHER): Payer: Medicare Other | Admitting: *Deleted

## 2015-10-26 DIAGNOSIS — Z5181 Encounter for therapeutic drug level monitoring: Secondary | ICD-10-CM

## 2015-10-26 DIAGNOSIS — I4891 Unspecified atrial fibrillation: Secondary | ICD-10-CM

## 2015-10-26 LAB — POCT INR: INR: 2.3

## 2015-11-02 DIAGNOSIS — I509 Heart failure, unspecified: Secondary | ICD-10-CM | POA: Diagnosis not present

## 2015-11-02 DIAGNOSIS — I10 Essential (primary) hypertension: Secondary | ICD-10-CM | POA: Diagnosis not present

## 2015-11-02 DIAGNOSIS — E119 Type 2 diabetes mellitus without complications: Secondary | ICD-10-CM | POA: Diagnosis not present

## 2015-11-02 DIAGNOSIS — J449 Chronic obstructive pulmonary disease, unspecified: Secondary | ICD-10-CM | POA: Diagnosis not present

## 2015-11-09 DIAGNOSIS — E1151 Type 2 diabetes mellitus with diabetic peripheral angiopathy without gangrene: Secondary | ICD-10-CM | POA: Diagnosis not present

## 2015-11-09 DIAGNOSIS — E1140 Type 2 diabetes mellitus with diabetic neuropathy, unspecified: Secondary | ICD-10-CM | POA: Diagnosis not present

## 2015-11-11 ENCOUNTER — Other Ambulatory Visit: Payer: Self-pay | Admitting: *Deleted

## 2015-11-11 MED ORDER — WARFARIN SODIUM 7.5 MG PO TABS: ORAL_TABLET | ORAL | 2 refills | Status: DC

## 2015-11-16 ENCOUNTER — Ambulatory Visit (INDEPENDENT_AMBULATORY_CARE_PROVIDER_SITE_OTHER): Payer: Medicare Other | Admitting: *Deleted

## 2015-11-16 DIAGNOSIS — I4891 Unspecified atrial fibrillation: Secondary | ICD-10-CM

## 2015-11-16 DIAGNOSIS — Z5181 Encounter for therapeutic drug level monitoring: Secondary | ICD-10-CM | POA: Diagnosis not present

## 2015-11-16 LAB — POCT INR: INR: 2.5

## 2015-11-19 NOTE — Telephone Encounter (Signed)
Error

## 2015-11-23 DIAGNOSIS — M1990 Unspecified osteoarthritis, unspecified site: Secondary | ICD-10-CM | POA: Diagnosis not present

## 2015-11-23 DIAGNOSIS — Z299 Encounter for prophylactic measures, unspecified: Secondary | ICD-10-CM | POA: Diagnosis not present

## 2015-11-23 DIAGNOSIS — I5032 Chronic diastolic (congestive) heart failure: Secondary | ICD-10-CM | POA: Diagnosis not present

## 2015-11-23 DIAGNOSIS — E1129 Type 2 diabetes mellitus with other diabetic kidney complication: Secondary | ICD-10-CM | POA: Diagnosis not present

## 2015-11-23 DIAGNOSIS — E1140 Type 2 diabetes mellitus with diabetic neuropathy, unspecified: Secondary | ICD-10-CM | POA: Diagnosis not present

## 2015-12-14 ENCOUNTER — Ambulatory Visit (INDEPENDENT_AMBULATORY_CARE_PROVIDER_SITE_OTHER): Payer: Medicare Other | Admitting: *Deleted

## 2015-12-14 DIAGNOSIS — Z5181 Encounter for therapeutic drug level monitoring: Secondary | ICD-10-CM | POA: Diagnosis not present

## 2015-12-14 DIAGNOSIS — I4891 Unspecified atrial fibrillation: Secondary | ICD-10-CM

## 2015-12-14 LAB — POCT INR: INR: 1.8

## 2015-12-15 DIAGNOSIS — Z79899 Other long term (current) drug therapy: Secondary | ICD-10-CM | POA: Diagnosis not present

## 2015-12-15 DIAGNOSIS — Z794 Long term (current) use of insulin: Secondary | ICD-10-CM | POA: Diagnosis not present

## 2015-12-15 DIAGNOSIS — I129 Hypertensive chronic kidney disease with stage 1 through stage 4 chronic kidney disease, or unspecified chronic kidney disease: Secondary | ICD-10-CM | POA: Diagnosis present

## 2015-12-15 DIAGNOSIS — L03032 Cellulitis of left toe: Secondary | ICD-10-CM | POA: Diagnosis not present

## 2015-12-15 DIAGNOSIS — I872 Venous insufficiency (chronic) (peripheral): Secondary | ICD-10-CM | POA: Diagnosis present

## 2015-12-15 DIAGNOSIS — E875 Hyperkalemia: Secondary | ICD-10-CM | POA: Diagnosis not present

## 2015-12-15 DIAGNOSIS — Z66 Do not resuscitate: Secondary | ICD-10-CM | POA: Diagnosis present

## 2015-12-15 DIAGNOSIS — E860 Dehydration: Secondary | ICD-10-CM | POA: Diagnosis not present

## 2015-12-15 DIAGNOSIS — Z7901 Long term (current) use of anticoagulants: Secondary | ICD-10-CM | POA: Diagnosis not present

## 2015-12-15 DIAGNOSIS — X31XXXA Exposure to excessive natural cold, initial encounter: Secondary | ICD-10-CM | POA: Diagnosis not present

## 2015-12-15 DIAGNOSIS — I429 Cardiomyopathy, unspecified: Secondary | ICD-10-CM | POA: Diagnosis present

## 2015-12-15 DIAGNOSIS — L97529 Non-pressure chronic ulcer of other part of left foot with unspecified severity: Secondary | ICD-10-CM | POA: Diagnosis present

## 2015-12-15 DIAGNOSIS — E11621 Type 2 diabetes mellitus with foot ulcer: Secondary | ICD-10-CM | POA: Diagnosis present

## 2015-12-15 DIAGNOSIS — N179 Acute kidney failure, unspecified: Secondary | ICD-10-CM | POA: Diagnosis present

## 2015-12-15 DIAGNOSIS — I272 Pulmonary hypertension, unspecified: Secondary | ICD-10-CM | POA: Diagnosis present

## 2015-12-15 DIAGNOSIS — S4991XA Unspecified injury of right shoulder and upper arm, initial encounter: Secondary | ICD-10-CM | POA: Diagnosis not present

## 2015-12-15 DIAGNOSIS — E1122 Type 2 diabetes mellitus with diabetic chronic kidney disease: Secondary | ICD-10-CM | POA: Diagnosis present

## 2015-12-15 DIAGNOSIS — R509 Fever, unspecified: Secondary | ICD-10-CM | POA: Diagnosis not present

## 2015-12-15 DIAGNOSIS — N183 Chronic kidney disease, stage 3 (moderate): Secondary | ICD-10-CM | POA: Diagnosis present

## 2015-12-15 DIAGNOSIS — T887XXA Unspecified adverse effect of drug or medicament, initial encounter: Secondary | ICD-10-CM | POA: Diagnosis not present

## 2015-12-15 DIAGNOSIS — R404 Transient alteration of awareness: Secondary | ICD-10-CM | POA: Diagnosis not present

## 2015-12-15 DIAGNOSIS — N390 Urinary tract infection, site not specified: Secondary | ICD-10-CM | POA: Diagnosis not present

## 2015-12-15 DIAGNOSIS — J449 Chronic obstructive pulmonary disease, unspecified: Secondary | ICD-10-CM | POA: Diagnosis present

## 2015-12-15 DIAGNOSIS — R531 Weakness: Secondary | ICD-10-CM | POA: Diagnosis not present

## 2015-12-15 DIAGNOSIS — D638 Anemia in other chronic diseases classified elsewhere: Secondary | ICD-10-CM | POA: Diagnosis present

## 2015-12-15 DIAGNOSIS — I2510 Atherosclerotic heart disease of native coronary artery without angina pectoris: Secondary | ICD-10-CM | POA: Diagnosis present

## 2015-12-15 DIAGNOSIS — B9620 Unspecified Escherichia coli [E. coli] as the cause of diseases classified elsewhere: Secondary | ICD-10-CM | POA: Diagnosis present

## 2015-12-15 DIAGNOSIS — M25511 Pain in right shoulder: Secondary | ICD-10-CM | POA: Diagnosis not present

## 2015-12-15 DIAGNOSIS — I480 Paroxysmal atrial fibrillation: Secondary | ICD-10-CM | POA: Diagnosis present

## 2015-12-24 DIAGNOSIS — M545 Low back pain: Secondary | ICD-10-CM | POA: Diagnosis not present

## 2015-12-24 DIAGNOSIS — M1990 Unspecified osteoarthritis, unspecified site: Secondary | ICD-10-CM | POA: Diagnosis not present

## 2015-12-24 DIAGNOSIS — I872 Venous insufficiency (chronic) (peripheral): Secondary | ICD-10-CM | POA: Diagnosis not present

## 2015-12-24 DIAGNOSIS — Z79891 Long term (current) use of opiate analgesic: Secondary | ICD-10-CM | POA: Diagnosis not present

## 2015-12-24 DIAGNOSIS — G8929 Other chronic pain: Secondary | ICD-10-CM | POA: Diagnosis not present

## 2015-12-24 DIAGNOSIS — I130 Hypertensive heart and chronic kidney disease with heart failure and stage 1 through stage 4 chronic kidney disease, or unspecified chronic kidney disease: Secondary | ICD-10-CM | POA: Diagnosis not present

## 2015-12-24 DIAGNOSIS — Z794 Long term (current) use of insulin: Secondary | ICD-10-CM | POA: Diagnosis not present

## 2015-12-24 DIAGNOSIS — Z87440 Personal history of urinary (tract) infections: Secondary | ICD-10-CM | POA: Diagnosis not present

## 2015-12-24 DIAGNOSIS — I272 Pulmonary hypertension, unspecified: Secondary | ICD-10-CM | POA: Diagnosis not present

## 2015-12-24 DIAGNOSIS — Z7901 Long term (current) use of anticoagulants: Secondary | ICD-10-CM | POA: Diagnosis not present

## 2015-12-24 DIAGNOSIS — E1122 Type 2 diabetes mellitus with diabetic chronic kidney disease: Secondary | ICD-10-CM | POA: Diagnosis not present

## 2015-12-24 DIAGNOSIS — I2510 Atherosclerotic heart disease of native coronary artery without angina pectoris: Secondary | ICD-10-CM | POA: Diagnosis not present

## 2015-12-24 DIAGNOSIS — I509 Heart failure, unspecified: Secondary | ICD-10-CM | POA: Diagnosis not present

## 2015-12-24 DIAGNOSIS — N189 Chronic kidney disease, unspecified: Secondary | ICD-10-CM | POA: Diagnosis not present

## 2015-12-28 DIAGNOSIS — I509 Heart failure, unspecified: Secondary | ICD-10-CM | POA: Diagnosis not present

## 2015-12-28 DIAGNOSIS — N189 Chronic kidney disease, unspecified: Secondary | ICD-10-CM | POA: Diagnosis not present

## 2015-12-28 DIAGNOSIS — Z87440 Personal history of urinary (tract) infections: Secondary | ICD-10-CM | POA: Diagnosis not present

## 2015-12-28 DIAGNOSIS — I872 Venous insufficiency (chronic) (peripheral): Secondary | ICD-10-CM | POA: Diagnosis not present

## 2015-12-28 DIAGNOSIS — I130 Hypertensive heart and chronic kidney disease with heart failure and stage 1 through stage 4 chronic kidney disease, or unspecified chronic kidney disease: Secondary | ICD-10-CM | POA: Diagnosis not present

## 2015-12-28 DIAGNOSIS — E1122 Type 2 diabetes mellitus with diabetic chronic kidney disease: Secondary | ICD-10-CM | POA: Diagnosis not present

## 2015-12-29 DIAGNOSIS — I130 Hypertensive heart and chronic kidney disease with heart failure and stage 1 through stage 4 chronic kidney disease, or unspecified chronic kidney disease: Secondary | ICD-10-CM | POA: Diagnosis not present

## 2015-12-29 DIAGNOSIS — Z87440 Personal history of urinary (tract) infections: Secondary | ICD-10-CM | POA: Diagnosis not present

## 2015-12-29 DIAGNOSIS — E1122 Type 2 diabetes mellitus with diabetic chronic kidney disease: Secondary | ICD-10-CM | POA: Diagnosis not present

## 2015-12-29 DIAGNOSIS — I509 Heart failure, unspecified: Secondary | ICD-10-CM | POA: Diagnosis not present

## 2015-12-29 DIAGNOSIS — N189 Chronic kidney disease, unspecified: Secondary | ICD-10-CM | POA: Diagnosis not present

## 2015-12-29 DIAGNOSIS — I872 Venous insufficiency (chronic) (peripheral): Secondary | ICD-10-CM | POA: Diagnosis not present

## 2015-12-30 DIAGNOSIS — I509 Heart failure, unspecified: Secondary | ICD-10-CM | POA: Diagnosis not present

## 2015-12-30 DIAGNOSIS — I872 Venous insufficiency (chronic) (peripheral): Secondary | ICD-10-CM | POA: Diagnosis not present

## 2015-12-30 DIAGNOSIS — Z87440 Personal history of urinary (tract) infections: Secondary | ICD-10-CM | POA: Diagnosis not present

## 2015-12-30 DIAGNOSIS — N189 Chronic kidney disease, unspecified: Secondary | ICD-10-CM | POA: Diagnosis not present

## 2015-12-30 DIAGNOSIS — I130 Hypertensive heart and chronic kidney disease with heart failure and stage 1 through stage 4 chronic kidney disease, or unspecified chronic kidney disease: Secondary | ICD-10-CM | POA: Diagnosis not present

## 2015-12-30 DIAGNOSIS — E1122 Type 2 diabetes mellitus with diabetic chronic kidney disease: Secondary | ICD-10-CM | POA: Diagnosis not present

## 2015-12-31 DIAGNOSIS — E1122 Type 2 diabetes mellitus with diabetic chronic kidney disease: Secondary | ICD-10-CM | POA: Diagnosis not present

## 2015-12-31 DIAGNOSIS — I872 Venous insufficiency (chronic) (peripheral): Secondary | ICD-10-CM | POA: Diagnosis not present

## 2015-12-31 DIAGNOSIS — I509 Heart failure, unspecified: Secondary | ICD-10-CM | POA: Diagnosis not present

## 2015-12-31 DIAGNOSIS — I130 Hypertensive heart and chronic kidney disease with heart failure and stage 1 through stage 4 chronic kidney disease, or unspecified chronic kidney disease: Secondary | ICD-10-CM | POA: Diagnosis not present

## 2015-12-31 DIAGNOSIS — N189 Chronic kidney disease, unspecified: Secondary | ICD-10-CM | POA: Diagnosis not present

## 2015-12-31 DIAGNOSIS — Z87440 Personal history of urinary (tract) infections: Secondary | ICD-10-CM | POA: Diagnosis not present

## 2016-01-03 DIAGNOSIS — E1122 Type 2 diabetes mellitus with diabetic chronic kidney disease: Secondary | ICD-10-CM | POA: Diagnosis not present

## 2016-01-03 DIAGNOSIS — I872 Venous insufficiency (chronic) (peripheral): Secondary | ICD-10-CM | POA: Diagnosis not present

## 2016-01-03 DIAGNOSIS — I509 Heart failure, unspecified: Secondary | ICD-10-CM | POA: Diagnosis not present

## 2016-01-03 DIAGNOSIS — I130 Hypertensive heart and chronic kidney disease with heart failure and stage 1 through stage 4 chronic kidney disease, or unspecified chronic kidney disease: Secondary | ICD-10-CM | POA: Diagnosis not present

## 2016-01-03 DIAGNOSIS — I480 Paroxysmal atrial fibrillation: Secondary | ICD-10-CM | POA: Diagnosis not present

## 2016-01-03 DIAGNOSIS — N189 Chronic kidney disease, unspecified: Secondary | ICD-10-CM | POA: Diagnosis not present

## 2016-01-03 DIAGNOSIS — N183 Chronic kidney disease, stage 3 (moderate): Secondary | ICD-10-CM | POA: Diagnosis not present

## 2016-01-03 DIAGNOSIS — Z87440 Personal history of urinary (tract) infections: Secondary | ICD-10-CM | POA: Diagnosis not present

## 2016-01-03 DIAGNOSIS — E1129 Type 2 diabetes mellitus with other diabetic kidney complication: Secondary | ICD-10-CM | POA: Diagnosis not present

## 2016-01-03 DIAGNOSIS — Z299 Encounter for prophylactic measures, unspecified: Secondary | ICD-10-CM | POA: Diagnosis not present

## 2016-01-05 DIAGNOSIS — Z87440 Personal history of urinary (tract) infections: Secondary | ICD-10-CM | POA: Diagnosis not present

## 2016-01-05 DIAGNOSIS — I509 Heart failure, unspecified: Secondary | ICD-10-CM | POA: Diagnosis not present

## 2016-01-05 DIAGNOSIS — I130 Hypertensive heart and chronic kidney disease with heart failure and stage 1 through stage 4 chronic kidney disease, or unspecified chronic kidney disease: Secondary | ICD-10-CM | POA: Diagnosis not present

## 2016-01-05 DIAGNOSIS — E1122 Type 2 diabetes mellitus with diabetic chronic kidney disease: Secondary | ICD-10-CM | POA: Diagnosis not present

## 2016-01-05 DIAGNOSIS — N189 Chronic kidney disease, unspecified: Secondary | ICD-10-CM | POA: Diagnosis not present

## 2016-01-05 DIAGNOSIS — I872 Venous insufficiency (chronic) (peripheral): Secondary | ICD-10-CM | POA: Diagnosis not present

## 2016-01-06 DIAGNOSIS — I872 Venous insufficiency (chronic) (peripheral): Secondary | ICD-10-CM | POA: Diagnosis not present

## 2016-01-06 DIAGNOSIS — N189 Chronic kidney disease, unspecified: Secondary | ICD-10-CM | POA: Diagnosis not present

## 2016-01-06 DIAGNOSIS — I130 Hypertensive heart and chronic kidney disease with heart failure and stage 1 through stage 4 chronic kidney disease, or unspecified chronic kidney disease: Secondary | ICD-10-CM | POA: Diagnosis not present

## 2016-01-06 DIAGNOSIS — E1122 Type 2 diabetes mellitus with diabetic chronic kidney disease: Secondary | ICD-10-CM | POA: Diagnosis not present

## 2016-01-06 DIAGNOSIS — I509 Heart failure, unspecified: Secondary | ICD-10-CM | POA: Diagnosis not present

## 2016-01-06 DIAGNOSIS — Z87440 Personal history of urinary (tract) infections: Secondary | ICD-10-CM | POA: Diagnosis not present

## 2016-01-10 DIAGNOSIS — I509 Heart failure, unspecified: Secondary | ICD-10-CM | POA: Diagnosis not present

## 2016-01-10 DIAGNOSIS — Z87440 Personal history of urinary (tract) infections: Secondary | ICD-10-CM | POA: Diagnosis not present

## 2016-01-10 DIAGNOSIS — I130 Hypertensive heart and chronic kidney disease with heart failure and stage 1 through stage 4 chronic kidney disease, or unspecified chronic kidney disease: Secondary | ICD-10-CM | POA: Diagnosis not present

## 2016-01-10 DIAGNOSIS — E1122 Type 2 diabetes mellitus with diabetic chronic kidney disease: Secondary | ICD-10-CM | POA: Diagnosis not present

## 2016-01-10 DIAGNOSIS — I872 Venous insufficiency (chronic) (peripheral): Secondary | ICD-10-CM | POA: Diagnosis not present

## 2016-01-10 DIAGNOSIS — N189 Chronic kidney disease, unspecified: Secondary | ICD-10-CM | POA: Diagnosis not present

## 2016-01-12 DIAGNOSIS — E1122 Type 2 diabetes mellitus with diabetic chronic kidney disease: Secondary | ICD-10-CM | POA: Diagnosis not present

## 2016-01-12 DIAGNOSIS — Z87440 Personal history of urinary (tract) infections: Secondary | ICD-10-CM | POA: Diagnosis not present

## 2016-01-12 DIAGNOSIS — I509 Heart failure, unspecified: Secondary | ICD-10-CM | POA: Diagnosis not present

## 2016-01-12 DIAGNOSIS — I872 Venous insufficiency (chronic) (peripheral): Secondary | ICD-10-CM | POA: Diagnosis not present

## 2016-01-12 DIAGNOSIS — N189 Chronic kidney disease, unspecified: Secondary | ICD-10-CM | POA: Diagnosis not present

## 2016-01-12 DIAGNOSIS — I130 Hypertensive heart and chronic kidney disease with heart failure and stage 1 through stage 4 chronic kidney disease, or unspecified chronic kidney disease: Secondary | ICD-10-CM | POA: Diagnosis not present

## 2016-01-17 DIAGNOSIS — I872 Venous insufficiency (chronic) (peripheral): Secondary | ICD-10-CM | POA: Diagnosis not present

## 2016-01-17 DIAGNOSIS — E1122 Type 2 diabetes mellitus with diabetic chronic kidney disease: Secondary | ICD-10-CM | POA: Diagnosis not present

## 2016-01-17 DIAGNOSIS — N189 Chronic kidney disease, unspecified: Secondary | ICD-10-CM | POA: Diagnosis not present

## 2016-01-17 DIAGNOSIS — I130 Hypertensive heart and chronic kidney disease with heart failure and stage 1 through stage 4 chronic kidney disease, or unspecified chronic kidney disease: Secondary | ICD-10-CM | POA: Diagnosis not present

## 2016-01-17 DIAGNOSIS — I509 Heart failure, unspecified: Secondary | ICD-10-CM | POA: Diagnosis not present

## 2016-01-17 DIAGNOSIS — Z87440 Personal history of urinary (tract) infections: Secondary | ICD-10-CM | POA: Diagnosis not present

## 2016-01-18 ENCOUNTER — Ambulatory Visit (INDEPENDENT_AMBULATORY_CARE_PROVIDER_SITE_OTHER): Payer: Medicare Other | Admitting: *Deleted

## 2016-01-18 DIAGNOSIS — E1140 Type 2 diabetes mellitus with diabetic neuropathy, unspecified: Secondary | ICD-10-CM | POA: Diagnosis not present

## 2016-01-18 DIAGNOSIS — Z5181 Encounter for therapeutic drug level monitoring: Secondary | ICD-10-CM

## 2016-01-18 DIAGNOSIS — I4891 Unspecified atrial fibrillation: Secondary | ICD-10-CM

## 2016-01-18 DIAGNOSIS — E1151 Type 2 diabetes mellitus with diabetic peripheral angiopathy without gangrene: Secondary | ICD-10-CM | POA: Diagnosis not present

## 2016-01-18 LAB — POCT INR: INR: 2.6

## 2016-01-19 DIAGNOSIS — E1122 Type 2 diabetes mellitus with diabetic chronic kidney disease: Secondary | ICD-10-CM | POA: Diagnosis not present

## 2016-01-19 DIAGNOSIS — N189 Chronic kidney disease, unspecified: Secondary | ICD-10-CM | POA: Diagnosis not present

## 2016-01-19 DIAGNOSIS — I130 Hypertensive heart and chronic kidney disease with heart failure and stage 1 through stage 4 chronic kidney disease, or unspecified chronic kidney disease: Secondary | ICD-10-CM | POA: Diagnosis not present

## 2016-01-19 DIAGNOSIS — Z87440 Personal history of urinary (tract) infections: Secondary | ICD-10-CM | POA: Diagnosis not present

## 2016-01-19 DIAGNOSIS — I509 Heart failure, unspecified: Secondary | ICD-10-CM | POA: Diagnosis not present

## 2016-01-19 DIAGNOSIS — I872 Venous insufficiency (chronic) (peripheral): Secondary | ICD-10-CM | POA: Diagnosis not present

## 2016-01-20 DIAGNOSIS — I872 Venous insufficiency (chronic) (peripheral): Secondary | ICD-10-CM | POA: Diagnosis not present

## 2016-01-20 DIAGNOSIS — I130 Hypertensive heart and chronic kidney disease with heart failure and stage 1 through stage 4 chronic kidney disease, or unspecified chronic kidney disease: Secondary | ICD-10-CM | POA: Diagnosis not present

## 2016-01-20 DIAGNOSIS — Z87440 Personal history of urinary (tract) infections: Secondary | ICD-10-CM | POA: Diagnosis not present

## 2016-01-20 DIAGNOSIS — I509 Heart failure, unspecified: Secondary | ICD-10-CM | POA: Diagnosis not present

## 2016-01-20 DIAGNOSIS — N189 Chronic kidney disease, unspecified: Secondary | ICD-10-CM | POA: Diagnosis not present

## 2016-01-20 DIAGNOSIS — E1122 Type 2 diabetes mellitus with diabetic chronic kidney disease: Secondary | ICD-10-CM | POA: Diagnosis not present

## 2016-01-25 DIAGNOSIS — I872 Venous insufficiency (chronic) (peripheral): Secondary | ICD-10-CM | POA: Diagnosis not present

## 2016-01-25 DIAGNOSIS — Z87440 Personal history of urinary (tract) infections: Secondary | ICD-10-CM | POA: Diagnosis not present

## 2016-01-25 DIAGNOSIS — I509 Heart failure, unspecified: Secondary | ICD-10-CM | POA: Diagnosis not present

## 2016-01-25 DIAGNOSIS — E1122 Type 2 diabetes mellitus with diabetic chronic kidney disease: Secondary | ICD-10-CM | POA: Diagnosis not present

## 2016-01-25 DIAGNOSIS — I130 Hypertensive heart and chronic kidney disease with heart failure and stage 1 through stage 4 chronic kidney disease, or unspecified chronic kidney disease: Secondary | ICD-10-CM | POA: Diagnosis not present

## 2016-01-25 DIAGNOSIS — N189 Chronic kidney disease, unspecified: Secondary | ICD-10-CM | POA: Diagnosis not present

## 2016-01-26 DIAGNOSIS — I509 Heart failure, unspecified: Secondary | ICD-10-CM | POA: Diagnosis not present

## 2016-01-26 DIAGNOSIS — R600 Localized edema: Secondary | ICD-10-CM | POA: Diagnosis not present

## 2016-01-26 DIAGNOSIS — G4700 Insomnia, unspecified: Secondary | ICD-10-CM | POA: Diagnosis not present

## 2016-02-03 DIAGNOSIS — Z87440 Personal history of urinary (tract) infections: Secondary | ICD-10-CM | POA: Diagnosis not present

## 2016-02-03 DIAGNOSIS — I509 Heart failure, unspecified: Secondary | ICD-10-CM | POA: Diagnosis not present

## 2016-02-03 DIAGNOSIS — E1122 Type 2 diabetes mellitus with diabetic chronic kidney disease: Secondary | ICD-10-CM | POA: Diagnosis not present

## 2016-02-03 DIAGNOSIS — I130 Hypertensive heart and chronic kidney disease with heart failure and stage 1 through stage 4 chronic kidney disease, or unspecified chronic kidney disease: Secondary | ICD-10-CM | POA: Diagnosis not present

## 2016-02-03 DIAGNOSIS — I872 Venous insufficiency (chronic) (peripheral): Secondary | ICD-10-CM | POA: Diagnosis not present

## 2016-02-03 DIAGNOSIS — N189 Chronic kidney disease, unspecified: Secondary | ICD-10-CM | POA: Diagnosis not present

## 2016-02-08 DIAGNOSIS — N189 Chronic kidney disease, unspecified: Secondary | ICD-10-CM | POA: Diagnosis not present

## 2016-02-08 DIAGNOSIS — I872 Venous insufficiency (chronic) (peripheral): Secondary | ICD-10-CM | POA: Diagnosis not present

## 2016-02-08 DIAGNOSIS — E1122 Type 2 diabetes mellitus with diabetic chronic kidney disease: Secondary | ICD-10-CM | POA: Diagnosis not present

## 2016-02-08 DIAGNOSIS — I130 Hypertensive heart and chronic kidney disease with heart failure and stage 1 through stage 4 chronic kidney disease, or unspecified chronic kidney disease: Secondary | ICD-10-CM | POA: Diagnosis not present

## 2016-02-08 DIAGNOSIS — Z87440 Personal history of urinary (tract) infections: Secondary | ICD-10-CM | POA: Diagnosis not present

## 2016-02-08 DIAGNOSIS — I509 Heart failure, unspecified: Secondary | ICD-10-CM | POA: Diagnosis not present

## 2016-02-17 DIAGNOSIS — E1122 Type 2 diabetes mellitus with diabetic chronic kidney disease: Secondary | ICD-10-CM | POA: Diagnosis not present

## 2016-02-17 DIAGNOSIS — I509 Heart failure, unspecified: Secondary | ICD-10-CM | POA: Diagnosis not present

## 2016-02-17 DIAGNOSIS — I130 Hypertensive heart and chronic kidney disease with heart failure and stage 1 through stage 4 chronic kidney disease, or unspecified chronic kidney disease: Secondary | ICD-10-CM | POA: Diagnosis not present

## 2016-02-17 DIAGNOSIS — I872 Venous insufficiency (chronic) (peripheral): Secondary | ICD-10-CM | POA: Diagnosis not present

## 2016-02-17 DIAGNOSIS — Z87440 Personal history of urinary (tract) infections: Secondary | ICD-10-CM | POA: Diagnosis not present

## 2016-02-17 DIAGNOSIS — N189 Chronic kidney disease, unspecified: Secondary | ICD-10-CM | POA: Diagnosis not present

## 2016-02-22 DIAGNOSIS — I509 Heart failure, unspecified: Secondary | ICD-10-CM | POA: Diagnosis not present

## 2016-02-22 DIAGNOSIS — R238 Other skin changes: Secondary | ICD-10-CM | POA: Diagnosis not present

## 2016-02-22 DIAGNOSIS — I130 Hypertensive heart and chronic kidney disease with heart failure and stage 1 through stage 4 chronic kidney disease, or unspecified chronic kidney disease: Secondary | ICD-10-CM | POA: Diagnosis not present

## 2016-02-22 DIAGNOSIS — M545 Low back pain: Secondary | ICD-10-CM | POA: Diagnosis not present

## 2016-02-22 DIAGNOSIS — G8929 Other chronic pain: Secondary | ICD-10-CM | POA: Diagnosis not present

## 2016-02-22 DIAGNOSIS — E1122 Type 2 diabetes mellitus with diabetic chronic kidney disease: Secondary | ICD-10-CM | POA: Diagnosis not present

## 2016-02-22 DIAGNOSIS — Z87440 Personal history of urinary (tract) infections: Secondary | ICD-10-CM | POA: Diagnosis not present

## 2016-02-22 DIAGNOSIS — Z79891 Long term (current) use of opiate analgesic: Secondary | ICD-10-CM | POA: Diagnosis not present

## 2016-02-22 DIAGNOSIS — N189 Chronic kidney disease, unspecified: Secondary | ICD-10-CM | POA: Diagnosis not present

## 2016-02-22 DIAGNOSIS — I272 Pulmonary hypertension, unspecified: Secondary | ICD-10-CM | POA: Diagnosis not present

## 2016-02-22 DIAGNOSIS — I872 Venous insufficiency (chronic) (peripheral): Secondary | ICD-10-CM | POA: Diagnosis not present

## 2016-02-22 DIAGNOSIS — I2510 Atherosclerotic heart disease of native coronary artery without angina pectoris: Secondary | ICD-10-CM | POA: Diagnosis not present

## 2016-02-22 DIAGNOSIS — M1990 Unspecified osteoarthritis, unspecified site: Secondary | ICD-10-CM | POA: Diagnosis not present

## 2016-02-22 DIAGNOSIS — Z7901 Long term (current) use of anticoagulants: Secondary | ICD-10-CM | POA: Diagnosis not present

## 2016-02-22 DIAGNOSIS — Z794 Long term (current) use of insulin: Secondary | ICD-10-CM | POA: Diagnosis not present

## 2016-02-23 DIAGNOSIS — R238 Other skin changes: Secondary | ICD-10-CM | POA: Diagnosis not present

## 2016-02-23 DIAGNOSIS — E1122 Type 2 diabetes mellitus with diabetic chronic kidney disease: Secondary | ICD-10-CM | POA: Diagnosis not present

## 2016-02-23 DIAGNOSIS — I872 Venous insufficiency (chronic) (peripheral): Secondary | ICD-10-CM | POA: Diagnosis not present

## 2016-02-23 DIAGNOSIS — N189 Chronic kidney disease, unspecified: Secondary | ICD-10-CM | POA: Diagnosis not present

## 2016-02-23 DIAGNOSIS — I130 Hypertensive heart and chronic kidney disease with heart failure and stage 1 through stage 4 chronic kidney disease, or unspecified chronic kidney disease: Secondary | ICD-10-CM | POA: Diagnosis not present

## 2016-02-23 DIAGNOSIS — I509 Heart failure, unspecified: Secondary | ICD-10-CM | POA: Diagnosis not present

## 2016-02-24 DIAGNOSIS — E1122 Type 2 diabetes mellitus with diabetic chronic kidney disease: Secondary | ICD-10-CM | POA: Diagnosis not present

## 2016-02-24 DIAGNOSIS — R238 Other skin changes: Secondary | ICD-10-CM | POA: Diagnosis not present

## 2016-02-24 DIAGNOSIS — I872 Venous insufficiency (chronic) (peripheral): Secondary | ICD-10-CM | POA: Diagnosis not present

## 2016-02-24 DIAGNOSIS — I509 Heart failure, unspecified: Secondary | ICD-10-CM | POA: Diagnosis not present

## 2016-02-24 DIAGNOSIS — I130 Hypertensive heart and chronic kidney disease with heart failure and stage 1 through stage 4 chronic kidney disease, or unspecified chronic kidney disease: Secondary | ICD-10-CM | POA: Diagnosis not present

## 2016-02-24 DIAGNOSIS — N189 Chronic kidney disease, unspecified: Secondary | ICD-10-CM | POA: Diagnosis not present

## 2016-02-28 ENCOUNTER — Telehealth: Payer: Self-pay | Admitting: Cardiovascular Disease

## 2016-02-28 ENCOUNTER — Other Ambulatory Visit: Payer: Self-pay | Admitting: *Deleted

## 2016-02-28 DIAGNOSIS — I509 Heart failure, unspecified: Secondary | ICD-10-CM | POA: Diagnosis not present

## 2016-02-28 DIAGNOSIS — E1122 Type 2 diabetes mellitus with diabetic chronic kidney disease: Secondary | ICD-10-CM | POA: Diagnosis not present

## 2016-02-28 DIAGNOSIS — R238 Other skin changes: Secondary | ICD-10-CM | POA: Diagnosis not present

## 2016-02-28 DIAGNOSIS — I130 Hypertensive heart and chronic kidney disease with heart failure and stage 1 through stage 4 chronic kidney disease, or unspecified chronic kidney disease: Secondary | ICD-10-CM | POA: Diagnosis not present

## 2016-02-28 DIAGNOSIS — N189 Chronic kidney disease, unspecified: Secondary | ICD-10-CM | POA: Diagnosis not present

## 2016-02-28 DIAGNOSIS — I872 Venous insufficiency (chronic) (peripheral): Secondary | ICD-10-CM | POA: Diagnosis not present

## 2016-02-28 MED ORDER — WARFARIN SODIUM 7.5 MG PO TABS: ORAL_TABLET | ORAL | 4 refills | Status: DC

## 2016-02-28 NOTE — Telephone Encounter (Signed)
Refill sent to [facility]

## 2016-02-28 NOTE — Telephone Encounter (Signed)
Needs refill on coumdin.  Please send [facility]    Fax # [phone]

## 2016-02-29 ENCOUNTER — Ambulatory Visit (INDEPENDENT_AMBULATORY_CARE_PROVIDER_SITE_OTHER): Payer: Medicare Other | Admitting: *Deleted

## 2016-02-29 DIAGNOSIS — Z5181 Encounter for therapeutic drug level monitoring: Secondary | ICD-10-CM | POA: Diagnosis not present

## 2016-02-29 DIAGNOSIS — I509 Heart failure, unspecified: Secondary | ICD-10-CM | POA: Diagnosis not present

## 2016-02-29 DIAGNOSIS — E1129 Type 2 diabetes mellitus with other diabetic kidney complication: Secondary | ICD-10-CM | POA: Diagnosis not present

## 2016-02-29 DIAGNOSIS — Z299 Encounter for prophylactic measures, unspecified: Secondary | ICD-10-CM | POA: Diagnosis not present

## 2016-02-29 DIAGNOSIS — J449 Chronic obstructive pulmonary disease, unspecified: Secondary | ICD-10-CM | POA: Diagnosis not present

## 2016-02-29 DIAGNOSIS — I4891 Unspecified atrial fibrillation: Secondary | ICD-10-CM | POA: Diagnosis not present

## 2016-02-29 DIAGNOSIS — Z87891 Personal history of nicotine dependence: Secondary | ICD-10-CM | POA: Diagnosis not present

## 2016-02-29 DIAGNOSIS — Z6841 Body Mass Index (BMI) 40.0 and over, adult: Secondary | ICD-10-CM | POA: Diagnosis not present

## 2016-02-29 LAB — POCT INR: INR: 5.1

## 2016-03-06 DIAGNOSIS — R238 Other skin changes: Secondary | ICD-10-CM | POA: Diagnosis not present

## 2016-03-06 DIAGNOSIS — N189 Chronic kidney disease, unspecified: Secondary | ICD-10-CM | POA: Diagnosis not present

## 2016-03-06 DIAGNOSIS — I130 Hypertensive heart and chronic kidney disease with heart failure and stage 1 through stage 4 chronic kidney disease, or unspecified chronic kidney disease: Secondary | ICD-10-CM | POA: Diagnosis not present

## 2016-03-06 DIAGNOSIS — E1122 Type 2 diabetes mellitus with diabetic chronic kidney disease: Secondary | ICD-10-CM | POA: Diagnosis not present

## 2016-03-06 DIAGNOSIS — I872 Venous insufficiency (chronic) (peripheral): Secondary | ICD-10-CM | POA: Diagnosis not present

## 2016-03-06 DIAGNOSIS — I509 Heart failure, unspecified: Secondary | ICD-10-CM | POA: Diagnosis not present

## 2016-03-13 ENCOUNTER — Encounter: Payer: Self-pay | Admitting: *Deleted

## 2016-03-13 DIAGNOSIS — I130 Hypertensive heart and chronic kidney disease with heart failure and stage 1 through stage 4 chronic kidney disease, or unspecified chronic kidney disease: Secondary | ICD-10-CM | POA: Diagnosis not present

## 2016-03-13 DIAGNOSIS — N189 Chronic kidney disease, unspecified: Secondary | ICD-10-CM | POA: Diagnosis not present

## 2016-03-13 DIAGNOSIS — I509 Heart failure, unspecified: Secondary | ICD-10-CM | POA: Diagnosis not present

## 2016-03-13 DIAGNOSIS — R238 Other skin changes: Secondary | ICD-10-CM | POA: Diagnosis not present

## 2016-03-13 DIAGNOSIS — E1122 Type 2 diabetes mellitus with diabetic chronic kidney disease: Secondary | ICD-10-CM | POA: Diagnosis not present

## 2016-03-13 DIAGNOSIS — I872 Venous insufficiency (chronic) (peripheral): Secondary | ICD-10-CM | POA: Diagnosis not present

## 2016-03-14 ENCOUNTER — Ambulatory Visit (INDEPENDENT_AMBULATORY_CARE_PROVIDER_SITE_OTHER): Payer: Medicare Other | Admitting: *Deleted

## 2016-03-14 ENCOUNTER — Encounter: Payer: Self-pay | Admitting: Cardiovascular Disease

## 2016-03-14 ENCOUNTER — Ambulatory Visit (INDEPENDENT_AMBULATORY_CARE_PROVIDER_SITE_OTHER): Payer: Medicare Other | Admitting: Cardiovascular Disease

## 2016-03-14 VITALS — BP 138/50 | HR 56 | Ht 60.0 in | Wt 199.0 lb

## 2016-03-14 DIAGNOSIS — Z5181 Encounter for therapeutic drug level monitoring: Secondary | ICD-10-CM

## 2016-03-14 DIAGNOSIS — I480 Paroxysmal atrial fibrillation: Secondary | ICD-10-CM

## 2016-03-14 DIAGNOSIS — I4891 Unspecified atrial fibrillation: Secondary | ICD-10-CM | POA: Diagnosis not present

## 2016-03-14 DIAGNOSIS — Z9289 Personal history of other medical treatment: Secondary | ICD-10-CM

## 2016-03-14 DIAGNOSIS — I10 Essential (primary) hypertension: Secondary | ICD-10-CM

## 2016-03-14 DIAGNOSIS — I5032 Chronic diastolic (congestive) heart failure: Secondary | ICD-10-CM | POA: Diagnosis not present

## 2016-03-14 LAB — POCT INR: INR: 4.9

## 2016-03-14 NOTE — Progress Notes (Signed)
SUBJECTIVE: The patient presents for follow-up of atrial fibrillation, hypertension, and chronic diastolic heart failure.   She was hospitalized in November 2017 at [facility] for acute on chronic renal failure, hyperkalemia, and rapid atrial fibrillation. 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 She had a diabetic foot ulcer at that time. All studies and labs with documentation reviewed pertaining to this hospital station. Potassium was 6.6. Troponin 0.04.   ECG showed atrial fibrillation, heart rate 96 bpm, with right bundle-branch block and PVCs. Another ECG showed SVT, heart rate 158 bpm. Another ECG showed rapid atrial fibrillation, heart rate 142 bpm. These were all performed on 12/19/15.  She said she is doing well. She uses 2 L of oxygen during the day. She does the laundry, does house cleaning, and mops floors without difficulty. She denies palpitations. Her legs are wrapped.   Review of Systems: As per "subjective", otherwise negative.  No Known Allergies  Current Outpatient Prescriptions  Medication Sig Dispense Refill  . atorvastatin (LIPITOR) 20 MG tablet Take 20 mg by mouth at bedtime.     . furosemide (LASIX) 40 MG tablet Take 1 tablet (40 mg total) by mouth daily. 30 tablet 6  . gabapentin (NEURONTIN) 300 MG capsule Take 1 capsule (300 mg total) by mouth 2 (two) times daily. 60 capsule 6  . hydrALAZINE (APRESOLINE) 50 MG tablet Take 1 tablet (50 mg total) by mouth 3 (three) times daily. 90 tablet 6  . insulin glargine (LANTUS) 100 UNIT/ML injection Inject 0.12 mLs (12 Units total) into the skin every morning. (Patient taking differently: Inject 12 Units into the skin daily. ) 10 mL 11  . insulin lispro (HUMALOG KWIKPEN) 100 UNIT/ML KiwkPen Inject 6 Units into the skin daily before supper.    . iron polysaccharides (NIFEREX) 150 MG capsule Take 150 mg by mouth daily.    Marland Kitchen warfarin (COUMADIN) 7.5 MG tablet Take 1 tablet daily except 1/2 tablet  on Tuesdays and Fridays.   Dose was changed last INR check. 30 tablet 4   No current facility-administered medications for this visit.     Past Medical History:  Diagnosis Date  . Anemia   . Arthritis    HANDS  . CHF (congestive heart failure) ([place])   . Chronic kidney disease 04/2013   . COPD (chronic obstructive pulmonary disease) ([place])   . DM2 (diabetes mellitus, type 2) ([place])   . HTN (hypertension)   . Paroxysmal atrial fibrillation ([facility])   . Shortness of breath     Past Surgical History:  Procedure Laterality Date  . VEIN SURGERY      Social History   Social History  . Marital status: Divorced    Spouse name: N/A  . Number of children: N/A  . Years of education: N/A   Occupational History  . Not on file.   Social History Main Topics  . Smoking status: Former Smoker    Packs/day: 1.00    Years: 29.00    Types: Cigarettes    Start date: 07/19/1955    Quit date: 02/21/1984  . Smokeless tobacco: Never Used  . Alcohol use No  . Drug use: No  . Sexual activity: No   Other Topics Concern  . Not on file   Social History Narrative  . No narrative on file     Vitals:   03/14/16 1117  BP: (!) 138/50  Pulse: (!) 56  SpO2: 97%  Weight: 199 lb (90.3 kg)  Height: 5' (  1.524 m)    PHYSICAL EXAM General: NAD HEENT: Normal. Using oxygen by nasal cannula Neck: No JVD, no thyromegaly. Lungs: No rales/wheezes. CV: Nondisplaced PMI.  Regular rate and rhythm, normal S1/S2, no S3/S4, no murmur. Legs are wrapped in bandages. Edematous. Abdomen: Soft, nontender, obese.  Neurologic: Alert and oriented.  Skin: Normal. Musculoskeletal: No gross deformities.    ECG: Most recent ECG reviewed.      ASSESSMENT AND PLAN:  1. Atrial fibrillation: Stable. As she is mildly bradycardic, I will avoid use of AV nodal blocking agents. Continue warfarin.  2. Chronic diastolic heart failure: Euvolemic. Continue Lasix 40 mg daily.  3. Essential HTN: Controlled on  hydralazine 50 mg 3 times daily. No changes.  Dispo: fu 1 yr.  Kate Sable, M.D., F.A.C.C.

## 2016-03-14 NOTE — Patient Instructions (Signed)

## 2016-03-16 ENCOUNTER — Telehealth: Payer: Self-pay | Admitting: Cardiovascular Disease

## 2016-03-16 MED ORDER — WARFARIN SODIUM 7.5 MG PO TABS: ORAL_TABLET | ORAL | 4 refills | Status: DC

## 2016-03-16 NOTE — Telephone Encounter (Signed)
Erica with [facility] called in regards to patient's coumdin change. States that they have not received prescription.  Please call Erica.

## 2016-03-16 NOTE — Telephone Encounter (Signed)
New warfarin Rx sent to [facility].

## 2016-03-20 DIAGNOSIS — N189 Chronic kidney disease, unspecified: Secondary | ICD-10-CM | POA: Diagnosis not present

## 2016-03-20 DIAGNOSIS — E1122 Type 2 diabetes mellitus with diabetic chronic kidney disease: Secondary | ICD-10-CM | POA: Diagnosis not present

## 2016-03-20 DIAGNOSIS — I872 Venous insufficiency (chronic) (peripheral): Secondary | ICD-10-CM | POA: Diagnosis not present

## 2016-03-20 DIAGNOSIS — I509 Heart failure, unspecified: Secondary | ICD-10-CM | POA: Diagnosis not present

## 2016-03-20 DIAGNOSIS — I130 Hypertensive heart and chronic kidney disease with heart failure and stage 1 through stage 4 chronic kidney disease, or unspecified chronic kidney disease: Secondary | ICD-10-CM | POA: Diagnosis not present

## 2016-03-20 DIAGNOSIS — R238 Other skin changes: Secondary | ICD-10-CM | POA: Diagnosis not present

## 2016-03-27 DIAGNOSIS — R238 Other skin changes: Secondary | ICD-10-CM | POA: Diagnosis not present

## 2016-03-27 DIAGNOSIS — I130 Hypertensive heart and chronic kidney disease with heart failure and stage 1 through stage 4 chronic kidney disease, or unspecified chronic kidney disease: Secondary | ICD-10-CM | POA: Diagnosis not present

## 2016-03-27 DIAGNOSIS — E1122 Type 2 diabetes mellitus with diabetic chronic kidney disease: Secondary | ICD-10-CM | POA: Diagnosis not present

## 2016-03-27 DIAGNOSIS — I872 Venous insufficiency (chronic) (peripheral): Secondary | ICD-10-CM | POA: Diagnosis not present

## 2016-03-27 DIAGNOSIS — N189 Chronic kidney disease, unspecified: Secondary | ICD-10-CM | POA: Diagnosis not present

## 2016-03-27 DIAGNOSIS — I509 Heart failure, unspecified: Secondary | ICD-10-CM | POA: Diagnosis not present

## 2016-03-28 ENCOUNTER — Ambulatory Visit (INDEPENDENT_AMBULATORY_CARE_PROVIDER_SITE_OTHER): Payer: Medicare Other | Admitting: *Deleted

## 2016-03-28 DIAGNOSIS — Z5181 Encounter for therapeutic drug level monitoring: Secondary | ICD-10-CM

## 2016-03-28 DIAGNOSIS — I4891 Unspecified atrial fibrillation: Secondary | ICD-10-CM | POA: Diagnosis not present

## 2016-03-28 DIAGNOSIS — E1151 Type 2 diabetes mellitus with diabetic peripheral angiopathy without gangrene: Secondary | ICD-10-CM | POA: Diagnosis not present

## 2016-03-28 DIAGNOSIS — E1140 Type 2 diabetes mellitus with diabetic neuropathy, unspecified: Secondary | ICD-10-CM | POA: Diagnosis not present

## 2016-03-28 LAB — POCT INR: INR: 3

## 2016-04-03 DIAGNOSIS — I509 Heart failure, unspecified: Secondary | ICD-10-CM | POA: Diagnosis not present

## 2016-04-03 DIAGNOSIS — N189 Chronic kidney disease, unspecified: Secondary | ICD-10-CM | POA: Diagnosis not present

## 2016-04-03 DIAGNOSIS — R238 Other skin changes: Secondary | ICD-10-CM | POA: Diagnosis not present

## 2016-04-03 DIAGNOSIS — I872 Venous insufficiency (chronic) (peripheral): Secondary | ICD-10-CM | POA: Diagnosis not present

## 2016-04-03 DIAGNOSIS — E1122 Type 2 diabetes mellitus with diabetic chronic kidney disease: Secondary | ICD-10-CM | POA: Diagnosis not present

## 2016-04-03 DIAGNOSIS — I130 Hypertensive heart and chronic kidney disease with heart failure and stage 1 through stage 4 chronic kidney disease, or unspecified chronic kidney disease: Secondary | ICD-10-CM | POA: Diagnosis not present

## 2016-04-05 DIAGNOSIS — I10 Essential (primary) hypertension: Secondary | ICD-10-CM | POA: Diagnosis not present

## 2016-04-05 DIAGNOSIS — J449 Chronic obstructive pulmonary disease, unspecified: Secondary | ICD-10-CM | POA: Diagnosis not present

## 2016-04-05 DIAGNOSIS — I509 Heart failure, unspecified: Secondary | ICD-10-CM | POA: Diagnosis not present

## 2016-04-05 DIAGNOSIS — E119 Type 2 diabetes mellitus without complications: Secondary | ICD-10-CM | POA: Diagnosis not present

## 2016-04-10 DIAGNOSIS — E1122 Type 2 diabetes mellitus with diabetic chronic kidney disease: Secondary | ICD-10-CM | POA: Diagnosis not present

## 2016-04-10 DIAGNOSIS — I509 Heart failure, unspecified: Secondary | ICD-10-CM | POA: Diagnosis not present

## 2016-04-10 DIAGNOSIS — R238 Other skin changes: Secondary | ICD-10-CM | POA: Diagnosis not present

## 2016-04-10 DIAGNOSIS — I872 Venous insufficiency (chronic) (peripheral): Secondary | ICD-10-CM | POA: Diagnosis not present

## 2016-04-10 DIAGNOSIS — I130 Hypertensive heart and chronic kidney disease with heart failure and stage 1 through stage 4 chronic kidney disease, or unspecified chronic kidney disease: Secondary | ICD-10-CM | POA: Diagnosis not present

## 2016-04-10 DIAGNOSIS — N189 Chronic kidney disease, unspecified: Secondary | ICD-10-CM | POA: Diagnosis not present

## 2016-04-17 DIAGNOSIS — N189 Chronic kidney disease, unspecified: Secondary | ICD-10-CM | POA: Diagnosis not present

## 2016-04-17 DIAGNOSIS — I872 Venous insufficiency (chronic) (peripheral): Secondary | ICD-10-CM | POA: Diagnosis not present

## 2016-04-17 DIAGNOSIS — R238 Other skin changes: Secondary | ICD-10-CM | POA: Diagnosis not present

## 2016-04-17 DIAGNOSIS — E1122 Type 2 diabetes mellitus with diabetic chronic kidney disease: Secondary | ICD-10-CM | POA: Diagnosis not present

## 2016-04-17 DIAGNOSIS — I130 Hypertensive heart and chronic kidney disease with heart failure and stage 1 through stage 4 chronic kidney disease, or unspecified chronic kidney disease: Secondary | ICD-10-CM | POA: Diagnosis not present

## 2016-04-17 DIAGNOSIS — I509 Heart failure, unspecified: Secondary | ICD-10-CM | POA: Diagnosis not present

## 2016-04-25 ENCOUNTER — Ambulatory Visit (INDEPENDENT_AMBULATORY_CARE_PROVIDER_SITE_OTHER): Payer: Medicare Other | Admitting: *Deleted

## 2016-04-25 DIAGNOSIS — I4891 Unspecified atrial fibrillation: Secondary | ICD-10-CM | POA: Diagnosis not present

## 2016-04-25 DIAGNOSIS — Z5181 Encounter for therapeutic drug level monitoring: Secondary | ICD-10-CM

## 2016-04-25 LAB — POCT INR: INR: 3.8

## 2016-04-27 ENCOUNTER — Other Ambulatory Visit: Payer: Self-pay | Admitting: Cardiovascular Disease

## 2016-04-27 MED ORDER — WARFARIN SODIUM 7.5 MG PO TABS: ORAL_TABLET | ORAL | 2 refills | Status: DC

## 2016-04-27 NOTE — Telephone Encounter (Signed)
Refill:  coumdin please send  To [facility]

## 2016-04-27 NOTE — Telephone Encounter (Signed)
Done

## 2016-05-03 DIAGNOSIS — J449 Chronic obstructive pulmonary disease, unspecified: Secondary | ICD-10-CM | POA: Diagnosis not present

## 2016-05-03 DIAGNOSIS — I10 Essential (primary) hypertension: Secondary | ICD-10-CM | POA: Diagnosis not present

## 2016-05-03 DIAGNOSIS — E119 Type 2 diabetes mellitus without complications: Secondary | ICD-10-CM | POA: Diagnosis not present

## 2016-05-03 DIAGNOSIS — I509 Heart failure, unspecified: Secondary | ICD-10-CM | POA: Diagnosis not present

## 2016-05-16 ENCOUNTER — Ambulatory Visit (INDEPENDENT_AMBULATORY_CARE_PROVIDER_SITE_OTHER): Payer: Medicare Other | Admitting: *Deleted

## 2016-05-16 DIAGNOSIS — I4891 Unspecified atrial fibrillation: Secondary | ICD-10-CM

## 2016-05-16 DIAGNOSIS — Z87891 Personal history of nicotine dependence: Secondary | ICD-10-CM | POA: Diagnosis not present

## 2016-05-16 DIAGNOSIS — Z5181 Encounter for therapeutic drug level monitoring: Secondary | ICD-10-CM | POA: Diagnosis not present

## 2016-05-16 DIAGNOSIS — E119 Type 2 diabetes mellitus without complications: Secondary | ICD-10-CM | POA: Diagnosis not present

## 2016-05-16 DIAGNOSIS — Z794 Long term (current) use of insulin: Secondary | ICD-10-CM | POA: Diagnosis not present

## 2016-05-16 DIAGNOSIS — I509 Heart failure, unspecified: Secondary | ICD-10-CM | POA: Diagnosis not present

## 2016-05-16 DIAGNOSIS — Z79899 Other long term (current) drug therapy: Secondary | ICD-10-CM | POA: Diagnosis not present

## 2016-05-16 DIAGNOSIS — S8392XA Sprain of unspecified site of left knee, initial encounter: Secondary | ICD-10-CM | POA: Diagnosis not present

## 2016-05-16 DIAGNOSIS — S8992XA Unspecified injury of left lower leg, initial encounter: Secondary | ICD-10-CM | POA: Diagnosis not present

## 2016-05-16 DIAGNOSIS — M25562 Pain in left knee: Secondary | ICD-10-CM | POA: Diagnosis not present

## 2016-05-16 DIAGNOSIS — Z9981 Dependence on supplemental oxygen: Secondary | ICD-10-CM | POA: Diagnosis not present

## 2016-05-16 DIAGNOSIS — E669 Obesity, unspecified: Secondary | ICD-10-CM | POA: Diagnosis not present

## 2016-05-16 DIAGNOSIS — W1839XA Other fall on same level, initial encounter: Secondary | ICD-10-CM | POA: Diagnosis not present

## 2016-05-16 DIAGNOSIS — Z7901 Long term (current) use of anticoagulants: Secondary | ICD-10-CM | POA: Diagnosis not present

## 2016-05-16 LAB — POCT INR: INR: 3.9

## 2016-05-19 ENCOUNTER — Telehealth: Payer: Self-pay | Admitting: Cardiovascular Disease

## 2016-05-19 MED ORDER — WARFARIN SODIUM 7.5 MG PO TABS: ORAL_TABLET | ORAL | 2 refills | Status: DC

## 2016-05-19 NOTE — Telephone Encounter (Signed)
Done

## 2016-05-19 NOTE — Telephone Encounter (Signed)
[facility] called stating that they need a new RX faxed to [facility]  [phone] for a change in Haley Cooper's coumdin prescription.

## 2016-05-20 DIAGNOSIS — R531 Weakness: Secondary | ICD-10-CM | POA: Diagnosis not present

## 2016-05-20 DIAGNOSIS — E119 Type 2 diabetes mellitus without complications: Secondary | ICD-10-CM | POA: Diagnosis not present

## 2016-05-20 DIAGNOSIS — R05 Cough: Secondary | ICD-10-CM | POA: Diagnosis not present

## 2016-05-20 DIAGNOSIS — R030 Elevated blood-pressure reading, without diagnosis of hypertension: Secondary | ICD-10-CM | POA: Diagnosis not present

## 2016-05-20 DIAGNOSIS — Z7901 Long term (current) use of anticoagulants: Secondary | ICD-10-CM | POA: Diagnosis not present

## 2016-05-20 DIAGNOSIS — I509 Heart failure, unspecified: Secondary | ICD-10-CM | POA: Diagnosis not present

## 2016-05-20 DIAGNOSIS — Z794 Long term (current) use of insulin: Secondary | ICD-10-CM | POA: Diagnosis not present

## 2016-05-20 DIAGNOSIS — Z87891 Personal history of nicotine dependence: Secondary | ICD-10-CM | POA: Diagnosis not present

## 2016-05-20 DIAGNOSIS — X31XXXA Exposure to excessive natural cold, initial encounter: Secondary | ICD-10-CM | POA: Diagnosis not present

## 2016-05-20 DIAGNOSIS — R9431 Abnormal electrocardiogram [ECG] [EKG]: Secondary | ICD-10-CM | POA: Diagnosis not present

## 2016-05-20 DIAGNOSIS — Z79899 Other long term (current) drug therapy: Secondary | ICD-10-CM | POA: Diagnosis not present

## 2016-06-06 DIAGNOSIS — I10 Essential (primary) hypertension: Secondary | ICD-10-CM | POA: Diagnosis not present

## 2016-06-06 DIAGNOSIS — N183 Chronic kidney disease, stage 3 (moderate): Secondary | ICD-10-CM | POA: Diagnosis not present

## 2016-06-06 DIAGNOSIS — E1129 Type 2 diabetes mellitus with other diabetic kidney complication: Secondary | ICD-10-CM | POA: Diagnosis not present

## 2016-06-06 DIAGNOSIS — F419 Anxiety disorder, unspecified: Secondary | ICD-10-CM | POA: Diagnosis not present

## 2016-06-06 DIAGNOSIS — Z6841 Body Mass Index (BMI) 40.0 and over, adult: Secondary | ICD-10-CM | POA: Diagnosis not present

## 2016-06-06 DIAGNOSIS — I509 Heart failure, unspecified: Secondary | ICD-10-CM | POA: Diagnosis not present

## 2016-06-06 DIAGNOSIS — J449 Chronic obstructive pulmonary disease, unspecified: Secondary | ICD-10-CM | POA: Diagnosis not present

## 2016-06-06 DIAGNOSIS — E7800 Pure hypercholesterolemia, unspecified: Secondary | ICD-10-CM | POA: Diagnosis not present

## 2016-06-06 DIAGNOSIS — Z299 Encounter for prophylactic measures, unspecified: Secondary | ICD-10-CM | POA: Diagnosis not present

## 2016-06-06 DIAGNOSIS — Z111 Encounter for screening for respiratory tuberculosis: Secondary | ICD-10-CM | POA: Diagnosis not present

## 2016-06-06 DIAGNOSIS — E1151 Type 2 diabetes mellitus with diabetic peripheral angiopathy without gangrene: Secondary | ICD-10-CM | POA: Diagnosis not present

## 2016-06-06 DIAGNOSIS — I480 Paroxysmal atrial fibrillation: Secondary | ICD-10-CM | POA: Diagnosis not present

## 2016-06-06 DIAGNOSIS — E1140 Type 2 diabetes mellitus with diabetic neuropathy, unspecified: Secondary | ICD-10-CM | POA: Diagnosis not present

## 2016-06-13 ENCOUNTER — Ambulatory Visit (INDEPENDENT_AMBULATORY_CARE_PROVIDER_SITE_OTHER): Payer: Medicare Other | Admitting: *Deleted

## 2016-06-13 DIAGNOSIS — I4891 Unspecified atrial fibrillation: Secondary | ICD-10-CM

## 2016-06-13 DIAGNOSIS — Z5181 Encounter for therapeutic drug level monitoring: Secondary | ICD-10-CM | POA: Diagnosis not present

## 2016-06-13 LAB — POCT INR: INR: 2.5

## 2016-06-16 DIAGNOSIS — J449 Chronic obstructive pulmonary disease, unspecified: Secondary | ICD-10-CM | POA: Diagnosis not present

## 2016-06-16 DIAGNOSIS — E119 Type 2 diabetes mellitus without complications: Secondary | ICD-10-CM | POA: Diagnosis not present

## 2016-06-16 DIAGNOSIS — I10 Essential (primary) hypertension: Secondary | ICD-10-CM | POA: Diagnosis not present

## 2016-06-16 DIAGNOSIS — I509 Heart failure, unspecified: Secondary | ICD-10-CM | POA: Diagnosis not present

## 2016-07-04 DIAGNOSIS — E113593 Type 2 diabetes mellitus with proliferative diabetic retinopathy without macular edema, bilateral: Secondary | ICD-10-CM | POA: Diagnosis not present

## 2016-07-05 DIAGNOSIS — Z299 Encounter for prophylactic measures, unspecified: Secondary | ICD-10-CM | POA: Diagnosis not present

## 2016-07-05 DIAGNOSIS — I480 Paroxysmal atrial fibrillation: Secondary | ICD-10-CM | POA: Diagnosis not present

## 2016-07-05 DIAGNOSIS — E7800 Pure hypercholesterolemia, unspecified: Secondary | ICD-10-CM | POA: Diagnosis not present

## 2016-07-05 DIAGNOSIS — K219 Gastro-esophageal reflux disease without esophagitis: Secondary | ICD-10-CM | POA: Diagnosis not present

## 2016-07-05 DIAGNOSIS — E1129 Type 2 diabetes mellitus with other diabetic kidney complication: Secondary | ICD-10-CM | POA: Diagnosis not present

## 2016-07-05 DIAGNOSIS — I10 Essential (primary) hypertension: Secondary | ICD-10-CM | POA: Diagnosis not present

## 2016-07-05 DIAGNOSIS — N183 Chronic kidney disease, stage 3 (moderate): Secondary | ICD-10-CM | POA: Diagnosis not present

## 2016-07-05 DIAGNOSIS — Z6841 Body Mass Index (BMI) 40.0 and over, adult: Secondary | ICD-10-CM | POA: Diagnosis not present

## 2016-07-05 DIAGNOSIS — I509 Heart failure, unspecified: Secondary | ICD-10-CM | POA: Diagnosis not present

## 2016-07-05 DIAGNOSIS — J449 Chronic obstructive pulmonary disease, unspecified: Secondary | ICD-10-CM | POA: Diagnosis not present

## 2016-07-07 DIAGNOSIS — I509 Heart failure, unspecified: Secondary | ICD-10-CM | POA: Diagnosis not present

## 2016-07-07 DIAGNOSIS — J449 Chronic obstructive pulmonary disease, unspecified: Secondary | ICD-10-CM | POA: Diagnosis not present

## 2016-07-07 DIAGNOSIS — E119 Type 2 diabetes mellitus without complications: Secondary | ICD-10-CM | POA: Diagnosis not present

## 2016-07-07 DIAGNOSIS — I10 Essential (primary) hypertension: Secondary | ICD-10-CM | POA: Diagnosis not present

## 2016-07-20 ENCOUNTER — Ambulatory Visit (INDEPENDENT_AMBULATORY_CARE_PROVIDER_SITE_OTHER): Payer: Medicare Other | Admitting: *Deleted

## 2016-07-20 DIAGNOSIS — I4891 Unspecified atrial fibrillation: Secondary | ICD-10-CM

## 2016-07-20 DIAGNOSIS — Z5181 Encounter for therapeutic drug level monitoring: Secondary | ICD-10-CM | POA: Diagnosis not present

## 2016-07-20 LAB — POCT INR: INR: 2.6

## 2016-08-03 DIAGNOSIS — I509 Heart failure, unspecified: Secondary | ICD-10-CM | POA: Diagnosis not present

## 2016-08-03 DIAGNOSIS — J449 Chronic obstructive pulmonary disease, unspecified: Secondary | ICD-10-CM | POA: Diagnosis not present

## 2016-08-03 DIAGNOSIS — E119 Type 2 diabetes mellitus without complications: Secondary | ICD-10-CM | POA: Diagnosis not present

## 2016-08-03 DIAGNOSIS — I10 Essential (primary) hypertension: Secondary | ICD-10-CM | POA: Diagnosis not present

## 2016-08-17 ENCOUNTER — Ambulatory Visit (INDEPENDENT_AMBULATORY_CARE_PROVIDER_SITE_OTHER): Payer: Medicare Other | Admitting: *Deleted

## 2016-08-17 DIAGNOSIS — I4891 Unspecified atrial fibrillation: Secondary | ICD-10-CM | POA: Diagnosis not present

## 2016-08-17 DIAGNOSIS — Z5181 Encounter for therapeutic drug level monitoring: Secondary | ICD-10-CM

## 2016-08-17 LAB — POCT INR: INR: 5.3

## 2016-08-29 ENCOUNTER — Ambulatory Visit (INDEPENDENT_AMBULATORY_CARE_PROVIDER_SITE_OTHER): Payer: Medicare Other | Admitting: *Deleted

## 2016-08-29 DIAGNOSIS — Z5181 Encounter for therapeutic drug level monitoring: Secondary | ICD-10-CM | POA: Diagnosis not present

## 2016-08-29 DIAGNOSIS — I4891 Unspecified atrial fibrillation: Secondary | ICD-10-CM | POA: Diagnosis not present

## 2016-08-29 LAB — POCT INR: INR: 3.8

## 2016-08-30 ENCOUNTER — Telehealth: Payer: Self-pay | Admitting: Cardiovascular Disease

## 2016-08-30 MED ORDER — WARFARIN SODIUM 7.5 MG PO TABS: ORAL_TABLET | ORAL | 2 refills | Status: DC

## 2016-08-30 NOTE — Telephone Encounter (Signed)
[facility] called in regards to Haley Cooper coumdin . They state that instructions were changed on last visit With coumdin visit.   They need new directions sent to [facility].   Please call [name] @ [facility]  [phone].

## 2016-08-30 NOTE — Telephone Encounter (Signed)
New coumadin orders sent to [facility].

## 2016-09-02 DIAGNOSIS — N179 Acute kidney failure, unspecified: Secondary | ICD-10-CM | POA: Diagnosis not present

## 2016-09-02 DIAGNOSIS — I272 Pulmonary hypertension, unspecified: Secondary | ICD-10-CM | POA: Diagnosis present

## 2016-09-02 DIAGNOSIS — Z79899 Other long term (current) drug therapy: Secondary | ICD-10-CM | POA: Diagnosis not present

## 2016-09-02 DIAGNOSIS — I2510 Atherosclerotic heart disease of native coronary artery without angina pectoris: Secondary | ICD-10-CM | POA: Diagnosis present

## 2016-09-02 DIAGNOSIS — G4730 Sleep apnea, unspecified: Secondary | ICD-10-CM | POA: Diagnosis present

## 2016-09-02 DIAGNOSIS — R319 Hematuria, unspecified: Secondary | ICD-10-CM | POA: Diagnosis not present

## 2016-09-02 DIAGNOSIS — R001 Bradycardia, unspecified: Secondary | ICD-10-CM | POA: Diagnosis present

## 2016-09-02 DIAGNOSIS — R4182 Altered mental status, unspecified: Secondary | ICD-10-CM | POA: Diagnosis present

## 2016-09-02 DIAGNOSIS — I499 Cardiac arrhythmia, unspecified: Secondary | ICD-10-CM | POA: Diagnosis not present

## 2016-09-02 DIAGNOSIS — I4891 Unspecified atrial fibrillation: Secondary | ICD-10-CM | POA: Diagnosis not present

## 2016-09-02 DIAGNOSIS — I5023 Acute on chronic systolic (congestive) heart failure: Secondary | ICD-10-CM | POA: Diagnosis not present

## 2016-09-02 DIAGNOSIS — J90 Pleural effusion, not elsewhere classified: Secondary | ICD-10-CM | POA: Diagnosis not present

## 2016-09-02 DIAGNOSIS — I10 Essential (primary) hypertension: Secondary | ICD-10-CM | POA: Diagnosis not present

## 2016-09-02 DIAGNOSIS — B9620 Unspecified Escherichia coli [E. coli] as the cause of diseases classified elsewhere: Secondary | ICD-10-CM | POA: Diagnosis present

## 2016-09-02 DIAGNOSIS — N390 Urinary tract infection, site not specified: Secondary | ICD-10-CM | POA: Diagnosis not present

## 2016-09-02 DIAGNOSIS — I872 Venous insufficiency (chronic) (peripheral): Secondary | ICD-10-CM | POA: Diagnosis present

## 2016-09-02 DIAGNOSIS — R0602 Shortness of breath: Secondary | ICD-10-CM | POA: Diagnosis not present

## 2016-09-02 DIAGNOSIS — D649 Anemia, unspecified: Secondary | ICD-10-CM | POA: Diagnosis present

## 2016-09-02 DIAGNOSIS — E1129 Type 2 diabetes mellitus with other diabetic kidney complication: Secondary | ICD-10-CM | POA: Diagnosis not present

## 2016-09-02 DIAGNOSIS — E1122 Type 2 diabetes mellitus with diabetic chronic kidney disease: Secondary | ICD-10-CM | POA: Diagnosis present

## 2016-09-02 DIAGNOSIS — T462X5A Adverse effect of other antidysrhythmic drugs, initial encounter: Secondary | ICD-10-CM | POA: Diagnosis present

## 2016-09-02 DIAGNOSIS — Z7901 Long term (current) use of anticoagulants: Secondary | ICD-10-CM | POA: Diagnosis not present

## 2016-09-02 DIAGNOSIS — Z87891 Personal history of nicotine dependence: Secondary | ICD-10-CM | POA: Diagnosis not present

## 2016-09-02 DIAGNOSIS — N184 Chronic kidney disease, stage 4 (severe): Secondary | ICD-10-CM | POA: Diagnosis present

## 2016-09-02 DIAGNOSIS — Z794 Long term (current) use of insulin: Secondary | ICD-10-CM | POA: Diagnosis not present

## 2016-09-02 DIAGNOSIS — I255 Ischemic cardiomyopathy: Secondary | ICD-10-CM | POA: Diagnosis present

## 2016-09-02 DIAGNOSIS — I129 Hypertensive chronic kidney disease with stage 1 through stage 4 chronic kidney disease, or unspecified chronic kidney disease: Secondary | ICD-10-CM | POA: Diagnosis present

## 2016-09-07 ENCOUNTER — Ambulatory Visit (INDEPENDENT_AMBULATORY_CARE_PROVIDER_SITE_OTHER): Payer: Medicare Other

## 2016-09-07 DIAGNOSIS — I4891 Unspecified atrial fibrillation: Secondary | ICD-10-CM | POA: Diagnosis not present

## 2016-09-12 ENCOUNTER — Ambulatory Visit (INDEPENDENT_AMBULATORY_CARE_PROVIDER_SITE_OTHER): Payer: Medicare Other | Admitting: *Deleted

## 2016-09-12 DIAGNOSIS — I4891 Unspecified atrial fibrillation: Secondary | ICD-10-CM | POA: Diagnosis not present

## 2016-09-12 DIAGNOSIS — Z5181 Encounter for therapeutic drug level monitoring: Secondary | ICD-10-CM

## 2016-09-12 LAB — POCT INR: INR: 2.2

## 2016-09-14 DIAGNOSIS — I509 Heart failure, unspecified: Secondary | ICD-10-CM | POA: Diagnosis not present

## 2016-09-14 DIAGNOSIS — J449 Chronic obstructive pulmonary disease, unspecified: Secondary | ICD-10-CM | POA: Diagnosis not present

## 2016-09-14 DIAGNOSIS — I480 Paroxysmal atrial fibrillation: Secondary | ICD-10-CM | POA: Diagnosis not present

## 2016-09-14 DIAGNOSIS — N183 Chronic kidney disease, stage 3 (moderate): Secondary | ICD-10-CM | POA: Diagnosis not present

## 2016-09-14 DIAGNOSIS — E7800 Pure hypercholesterolemia, unspecified: Secondary | ICD-10-CM | POA: Diagnosis not present

## 2016-09-14 DIAGNOSIS — I10 Essential (primary) hypertension: Secondary | ICD-10-CM | POA: Diagnosis not present

## 2016-09-14 DIAGNOSIS — F419 Anxiety disorder, unspecified: Secondary | ICD-10-CM | POA: Diagnosis not present

## 2016-09-14 DIAGNOSIS — E1122 Type 2 diabetes mellitus with diabetic chronic kidney disease: Secondary | ICD-10-CM | POA: Diagnosis not present

## 2016-09-14 DIAGNOSIS — Z299 Encounter for prophylactic measures, unspecified: Secondary | ICD-10-CM | POA: Diagnosis not present

## 2016-09-14 DIAGNOSIS — Z6841 Body Mass Index (BMI) 40.0 and over, adult: Secondary | ICD-10-CM | POA: Diagnosis not present

## 2016-09-23 DIAGNOSIS — I255 Ischemic cardiomyopathy: Secondary | ICD-10-CM | POA: Diagnosis present

## 2016-09-23 DIAGNOSIS — Z79899 Other long term (current) drug therapy: Secondary | ICD-10-CM | POA: Diagnosis not present

## 2016-09-23 DIAGNOSIS — M545 Low back pain: Secondary | ICD-10-CM | POA: Diagnosis not present

## 2016-09-23 DIAGNOSIS — Z9181 History of falling: Secondary | ICD-10-CM | POA: Diagnosis not present

## 2016-09-23 DIAGNOSIS — N179 Acute kidney failure, unspecified: Secondary | ICD-10-CM | POA: Diagnosis not present

## 2016-09-23 DIAGNOSIS — E11649 Type 2 diabetes mellitus with hypoglycemia without coma: Secondary | ICD-10-CM | POA: Diagnosis not present

## 2016-09-23 DIAGNOSIS — E1122 Type 2 diabetes mellitus with diabetic chronic kidney disease: Secondary | ICD-10-CM | POA: Diagnosis not present

## 2016-09-23 DIAGNOSIS — G4730 Sleep apnea, unspecified: Secondary | ICD-10-CM | POA: Diagnosis present

## 2016-09-23 DIAGNOSIS — N189 Chronic kidney disease, unspecified: Secondary | ICD-10-CM | POA: Diagnosis present

## 2016-09-23 DIAGNOSIS — R001 Bradycardia, unspecified: Secondary | ICD-10-CM | POA: Diagnosis present

## 2016-09-23 DIAGNOSIS — I959 Hypotension, unspecified: Secondary | ICD-10-CM | POA: Diagnosis present

## 2016-09-23 DIAGNOSIS — I501 Left ventricular failure: Secondary | ICD-10-CM | POA: Diagnosis present

## 2016-09-23 DIAGNOSIS — R404 Transient alteration of awareness: Secondary | ICD-10-CM | POA: Diagnosis not present

## 2016-09-23 DIAGNOSIS — Z7901 Long term (current) use of anticoagulants: Secondary | ICD-10-CM | POA: Diagnosis not present

## 2016-09-23 DIAGNOSIS — I272 Pulmonary hypertension, unspecified: Secondary | ICD-10-CM | POA: Diagnosis present

## 2016-09-23 DIAGNOSIS — S299XXA Unspecified injury of thorax, initial encounter: Secondary | ICD-10-CM | POA: Diagnosis not present

## 2016-09-23 DIAGNOSIS — I130 Hypertensive heart and chronic kidney disease with heart failure and stage 1 through stage 4 chronic kidney disease, or unspecified chronic kidney disease: Secondary | ICD-10-CM | POA: Diagnosis present

## 2016-09-23 DIAGNOSIS — M546 Pain in thoracic spine: Secondary | ICD-10-CM | POA: Diagnosis not present

## 2016-09-23 DIAGNOSIS — E161 Other hypoglycemia: Secondary | ICD-10-CM | POA: Diagnosis not present

## 2016-09-23 DIAGNOSIS — I2510 Atherosclerotic heart disease of native coronary artery without angina pectoris: Secondary | ICD-10-CM | POA: Diagnosis present

## 2016-09-23 DIAGNOSIS — E860 Dehydration: Secondary | ICD-10-CM | POA: Diagnosis present

## 2016-09-23 DIAGNOSIS — S3992XA Unspecified injury of lower back, initial encounter: Secondary | ICD-10-CM | POA: Diagnosis not present

## 2016-09-23 DIAGNOSIS — W050XXA Fall from non-moving wheelchair, initial encounter: Secondary | ICD-10-CM | POA: Diagnosis not present

## 2016-09-23 DIAGNOSIS — Z794 Long term (current) use of insulin: Secondary | ICD-10-CM | POA: Diagnosis not present

## 2016-10-10 ENCOUNTER — Ambulatory Visit (INDEPENDENT_AMBULATORY_CARE_PROVIDER_SITE_OTHER): Payer: Medicare Other | Admitting: *Deleted

## 2016-10-10 DIAGNOSIS — I4891 Unspecified atrial fibrillation: Secondary | ICD-10-CM | POA: Diagnosis not present

## 2016-10-10 DIAGNOSIS — Z5181 Encounter for therapeutic drug level monitoring: Secondary | ICD-10-CM

## 2016-10-10 LAB — POCT INR: INR: 1.2

## 2016-10-10 MED ORDER — WARFARIN SODIUM 7.5 MG PO TABS: ORAL_TABLET | ORAL | 2 refills | Status: DC

## 2016-10-11 DIAGNOSIS — J449 Chronic obstructive pulmonary disease, unspecified: Secondary | ICD-10-CM | POA: Diagnosis not present

## 2016-10-11 DIAGNOSIS — Z299 Encounter for prophylactic measures, unspecified: Secondary | ICD-10-CM | POA: Diagnosis not present

## 2016-10-11 DIAGNOSIS — I10 Essential (primary) hypertension: Secondary | ICD-10-CM | POA: Diagnosis not present

## 2016-10-11 DIAGNOSIS — E1165 Type 2 diabetes mellitus with hyperglycemia: Secondary | ICD-10-CM | POA: Diagnosis not present

## 2016-10-11 DIAGNOSIS — Z09 Encounter for follow-up examination after completed treatment for conditions other than malignant neoplasm: Secondary | ICD-10-CM | POA: Diagnosis not present

## 2016-10-11 DIAGNOSIS — I480 Paroxysmal atrial fibrillation: Secondary | ICD-10-CM | POA: Diagnosis not present

## 2016-10-11 DIAGNOSIS — Z6841 Body Mass Index (BMI) 40.0 and over, adult: Secondary | ICD-10-CM | POA: Diagnosis not present

## 2016-10-11 DIAGNOSIS — I509 Heart failure, unspecified: Secondary | ICD-10-CM | POA: Diagnosis not present

## 2016-10-11 DIAGNOSIS — N183 Chronic kidney disease, stage 3 (moderate): Secondary | ICD-10-CM | POA: Diagnosis not present

## 2016-10-17 ENCOUNTER — Ambulatory Visit (INDEPENDENT_AMBULATORY_CARE_PROVIDER_SITE_OTHER): Payer: Medicare Other | Admitting: *Deleted

## 2016-10-17 DIAGNOSIS — Z5181 Encounter for therapeutic drug level monitoring: Secondary | ICD-10-CM | POA: Diagnosis not present

## 2016-10-17 DIAGNOSIS — I4891 Unspecified atrial fibrillation: Secondary | ICD-10-CM | POA: Diagnosis not present

## 2016-10-17 LAB — POCT INR: INR: 2.4

## 2016-10-31 ENCOUNTER — Ambulatory Visit (INDEPENDENT_AMBULATORY_CARE_PROVIDER_SITE_OTHER): Payer: Medicare Other | Admitting: *Deleted

## 2016-10-31 DIAGNOSIS — I4891 Unspecified atrial fibrillation: Secondary | ICD-10-CM | POA: Diagnosis not present

## 2016-10-31 DIAGNOSIS — Z5181 Encounter for therapeutic drug level monitoring: Secondary | ICD-10-CM | POA: Diagnosis not present

## 2016-10-31 LAB — POCT INR: INR: 3.3

## 2016-11-08 DIAGNOSIS — E1151 Type 2 diabetes mellitus with diabetic peripheral angiopathy without gangrene: Secondary | ICD-10-CM | POA: Diagnosis not present

## 2016-11-08 DIAGNOSIS — E1140 Type 2 diabetes mellitus with diabetic neuropathy, unspecified: Secondary | ICD-10-CM | POA: Diagnosis not present

## 2016-11-14 DIAGNOSIS — J449 Chronic obstructive pulmonary disease, unspecified: Secondary | ICD-10-CM | POA: Diagnosis not present

## 2016-11-14 DIAGNOSIS — I10 Essential (primary) hypertension: Secondary | ICD-10-CM | POA: Diagnosis not present

## 2016-11-14 DIAGNOSIS — E1165 Type 2 diabetes mellitus with hyperglycemia: Secondary | ICD-10-CM | POA: Diagnosis not present

## 2016-11-14 DIAGNOSIS — E7800 Pure hypercholesterolemia, unspecified: Secondary | ICD-10-CM | POA: Diagnosis not present

## 2016-11-14 DIAGNOSIS — Z0000 Encounter for general adult medical examination without abnormal findings: Secondary | ICD-10-CM | POA: Diagnosis not present

## 2016-11-14 DIAGNOSIS — Z1389 Encounter for screening for other disorder: Secondary | ICD-10-CM | POA: Diagnosis not present

## 2016-11-14 DIAGNOSIS — Z7189 Other specified counseling: Secondary | ICD-10-CM | POA: Diagnosis not present

## 2016-11-14 DIAGNOSIS — I480 Paroxysmal atrial fibrillation: Secondary | ICD-10-CM | POA: Diagnosis not present

## 2016-11-14 DIAGNOSIS — Z79899 Other long term (current) drug therapy: Secondary | ICD-10-CM | POA: Diagnosis not present

## 2016-11-14 DIAGNOSIS — Z299 Encounter for prophylactic measures, unspecified: Secondary | ICD-10-CM | POA: Diagnosis not present

## 2016-11-14 DIAGNOSIS — I509 Heart failure, unspecified: Secondary | ICD-10-CM | POA: Diagnosis not present

## 2016-11-14 DIAGNOSIS — N183 Chronic kidney disease, stage 3 (moderate): Secondary | ICD-10-CM | POA: Diagnosis not present

## 2016-11-14 DIAGNOSIS — R5383 Other fatigue: Secondary | ICD-10-CM | POA: Diagnosis not present

## 2016-11-15 ENCOUNTER — Encounter: Payer: Self-pay | Admitting: *Deleted

## 2016-11-21 ENCOUNTER — Ambulatory Visit (INDEPENDENT_AMBULATORY_CARE_PROVIDER_SITE_OTHER): Payer: Medicare Other | Admitting: *Deleted

## 2016-11-21 DIAGNOSIS — I4891 Unspecified atrial fibrillation: Secondary | ICD-10-CM

## 2016-11-21 DIAGNOSIS — Z5181 Encounter for therapeutic drug level monitoring: Secondary | ICD-10-CM | POA: Diagnosis not present

## 2016-11-21 LAB — POCT INR: INR: 2.2

## 2016-12-01 DIAGNOSIS — J449 Chronic obstructive pulmonary disease, unspecified: Secondary | ICD-10-CM | POA: Diagnosis not present

## 2016-12-01 DIAGNOSIS — I10 Essential (primary) hypertension: Secondary | ICD-10-CM | POA: Diagnosis not present

## 2016-12-01 DIAGNOSIS — E119 Type 2 diabetes mellitus without complications: Secondary | ICD-10-CM | POA: Diagnosis not present

## 2016-12-01 DIAGNOSIS — I509 Heart failure, unspecified: Secondary | ICD-10-CM | POA: Diagnosis not present

## 2016-12-28 ENCOUNTER — Ambulatory Visit (INDEPENDENT_AMBULATORY_CARE_PROVIDER_SITE_OTHER): Payer: Medicare Other | Admitting: *Deleted

## 2016-12-28 DIAGNOSIS — Z5181 Encounter for therapeutic drug level monitoring: Secondary | ICD-10-CM | POA: Diagnosis not present

## 2016-12-28 DIAGNOSIS — I4891 Unspecified atrial fibrillation: Secondary | ICD-10-CM | POA: Diagnosis not present

## 2016-12-28 LAB — POCT INR: INR: 2

## 2017-01-25 ENCOUNTER — Ambulatory Visit (INDEPENDENT_AMBULATORY_CARE_PROVIDER_SITE_OTHER): Payer: Medicare Other | Admitting: *Deleted

## 2017-01-25 DIAGNOSIS — I4891 Unspecified atrial fibrillation: Secondary | ICD-10-CM

## 2017-01-25 DIAGNOSIS — Z5181 Encounter for therapeutic drug level monitoring: Secondary | ICD-10-CM | POA: Diagnosis not present

## 2017-01-25 LAB — POCT INR: INR: 2.1

## 2017-01-30 DIAGNOSIS — F6389 Other impulse disorders: Secondary | ICD-10-CM | POA: Diagnosis not present

## 2017-01-30 DIAGNOSIS — Z794 Long term (current) use of insulin: Secondary | ICD-10-CM | POA: Diagnosis not present

## 2017-01-30 DIAGNOSIS — E162 Hypoglycemia, unspecified: Secondary | ICD-10-CM | POA: Diagnosis not present

## 2017-01-30 DIAGNOSIS — N189 Chronic kidney disease, unspecified: Secondary | ICD-10-CM | POA: Diagnosis not present

## 2017-01-30 DIAGNOSIS — E161 Other hypoglycemia: Secondary | ICD-10-CM | POA: Diagnosis not present

## 2017-01-30 DIAGNOSIS — Z79899 Other long term (current) drug therapy: Secondary | ICD-10-CM | POA: Diagnosis not present

## 2017-01-30 DIAGNOSIS — Z87891 Personal history of nicotine dependence: Secondary | ICD-10-CM | POA: Diagnosis not present

## 2017-01-30 DIAGNOSIS — Z7901 Long term (current) use of anticoagulants: Secondary | ICD-10-CM | POA: Diagnosis not present

## 2017-01-30 DIAGNOSIS — E11649 Type 2 diabetes mellitus with hypoglycemia without coma: Secondary | ICD-10-CM | POA: Diagnosis not present

## 2017-01-30 DIAGNOSIS — I509 Heart failure, unspecified: Secondary | ICD-10-CM | POA: Diagnosis not present

## 2017-01-30 DIAGNOSIS — E1122 Type 2 diabetes mellitus with diabetic chronic kidney disease: Secondary | ICD-10-CM | POA: Diagnosis not present

## 2017-01-31 DIAGNOSIS — I10 Essential (primary) hypertension: Secondary | ICD-10-CM | POA: Diagnosis not present

## 2017-01-31 DIAGNOSIS — J449 Chronic obstructive pulmonary disease, unspecified: Secondary | ICD-10-CM | POA: Diagnosis not present

## 2017-01-31 DIAGNOSIS — I509 Heart failure, unspecified: Secondary | ICD-10-CM | POA: Diagnosis not present

## 2017-01-31 DIAGNOSIS — E119 Type 2 diabetes mellitus without complications: Secondary | ICD-10-CM | POA: Diagnosis not present

## 2017-02-01 ENCOUNTER — Other Ambulatory Visit: Payer: Self-pay | Admitting: *Deleted

## 2017-02-01 MED ORDER — WARFARIN SODIUM 7.5 MG PO TABS
ORAL_TABLET | ORAL | 4 refills | Status: DC
Start: 2017-02-01 — End: 2017-03-15

## 2017-02-08 DIAGNOSIS — E1151 Type 2 diabetes mellitus with diabetic peripheral angiopathy without gangrene: Secondary | ICD-10-CM | POA: Diagnosis not present

## 2017-02-08 DIAGNOSIS — E1140 Type 2 diabetes mellitus with diabetic neuropathy, unspecified: Secondary | ICD-10-CM | POA: Diagnosis not present

## 2017-03-01 DIAGNOSIS — F419 Anxiety disorder, unspecified: Secondary | ICD-10-CM | POA: Diagnosis not present

## 2017-03-01 DIAGNOSIS — I480 Paroxysmal atrial fibrillation: Secondary | ICD-10-CM | POA: Diagnosis not present

## 2017-03-01 DIAGNOSIS — J449 Chronic obstructive pulmonary disease, unspecified: Secondary | ICD-10-CM | POA: Diagnosis not present

## 2017-03-01 DIAGNOSIS — I10 Essential (primary) hypertension: Secondary | ICD-10-CM | POA: Diagnosis not present

## 2017-03-01 DIAGNOSIS — Z299 Encounter for prophylactic measures, unspecified: Secondary | ICD-10-CM | POA: Diagnosis not present

## 2017-03-01 DIAGNOSIS — E1165 Type 2 diabetes mellitus with hyperglycemia: Secondary | ICD-10-CM | POA: Diagnosis not present

## 2017-03-01 DIAGNOSIS — I509 Heart failure, unspecified: Secondary | ICD-10-CM | POA: Diagnosis not present

## 2017-03-01 DIAGNOSIS — Z6839 Body mass index (BMI) 39.0-39.9, adult: Secondary | ICD-10-CM | POA: Diagnosis not present

## 2017-03-15 ENCOUNTER — Telehealth: Payer: Self-pay | Admitting: Cardiovascular Disease

## 2017-03-15 ENCOUNTER — Ambulatory Visit (INDEPENDENT_AMBULATORY_CARE_PROVIDER_SITE_OTHER): Payer: Medicare Other | Admitting: *Deleted

## 2017-03-15 DIAGNOSIS — Z5181 Encounter for therapeutic drug level monitoring: Secondary | ICD-10-CM

## 2017-03-15 DIAGNOSIS — I4891 Unspecified atrial fibrillation: Secondary | ICD-10-CM | POA: Diagnosis not present

## 2017-03-15 LAB — POCT INR: INR: 1.6

## 2017-03-15 MED ORDER — WARFARIN SODIUM 7.5 MG PO TABS: ORAL_TABLET | ORAL | 4 refills | Status: DC

## 2017-03-15 NOTE — Patient Instructions (Signed)
Take coumadin 1 tablet tonight then resume 1/2 tablet daily except 1 tablet on Mondays, Wednesdays and Fridays  Recheck in 2.5 weeks

## 2017-03-15 NOTE — Telephone Encounter (Signed)
°*  STAT* If patient is at the pharmacy, call can be transferred to refill team.   1. Which medications need to be refilled? (please list name of each medication and dose if known)  warfarin (COUMADIN) 7.5 MG tablet   2. Which pharmacy/location (including street and city if local pharmacy) is medication to be sent to?  [address] [place]  3. Do they need a 30 day or 90 day supply?

## 2017-03-15 NOTE — Telephone Encounter (Signed)
Warfarin refill sent to [facility].

## 2017-03-20 ENCOUNTER — Ambulatory Visit (INDEPENDENT_AMBULATORY_CARE_PROVIDER_SITE_OTHER): Payer: Medicare Other | Admitting: Cardiovascular Disease

## 2017-03-20 ENCOUNTER — Encounter: Payer: Self-pay | Admitting: *Deleted

## 2017-03-20 VITALS — BP 140/64 | HR 84 | Ht 60.0 in | Wt 189.0 lb

## 2017-03-20 DIAGNOSIS — I5032 Chronic diastolic (congestive) heart failure: Secondary | ICD-10-CM | POA: Diagnosis not present

## 2017-03-20 DIAGNOSIS — I10 Essential (primary) hypertension: Secondary | ICD-10-CM

## 2017-03-20 DIAGNOSIS — I480 Paroxysmal atrial fibrillation: Secondary | ICD-10-CM

## 2017-03-20 NOTE — Progress Notes (Signed)
SUBJECTIVE: The patient presents for follow-up of atrial fibrillation, hypertension, and chronic diastolic heart failure.   Echocardiogram 08/22/15 demonstrated normal left ventricular systolic function, LVEF 46-50%, moderate LVH, grade 2 diastolic dysfunction with elevated left ventricular filling pressures, severe left atrial and mild right ventricular and mild right atrial dilatation, moderate tricuspid regurgitation with moderate to severely elevated pulmonary pressures, 61 mmHg.  She has a history of chronic hypoxic respiratory failure and uses 2 L of oxygen throughout the day.  She also has chronic bilateral leg edema.  She denies chest pain and palpitations.  Chronic exertional dyspnea is stable.  She quit smoking 33 years ago.  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  She denies orthopnea and paroxysmal nocturnal dyspnea.  ECG performed today which I interpreted demonstrates sinus rhythm with nonspecific IVCD and nonspecific Q waves in inferior and anterolateral leads with T wave inversions in leads III, aVF, and V3 through V6.    Review of Systems: As per "subjective", otherwise negative.  No Known Allergies  Current Outpatient Medications  Medication Sig Dispense Refill  . ALPRAZolam (XANAX) 0.25 MG tablet Take 0.25 mg by mouth 3 (three) times daily as needed for anxiety.    Marland Kitchen atorvastatin (LIPITOR) 20 MG tablet Take 20 mg by mouth at bedtime.     . furosemide (LASIX) 40 MG tablet Take 1 tablet (40 mg total) by mouth daily. 30 tablet 6  . gabapentin (NEURONTIN) 300 MG capsule Take 1 capsule (300 mg total) by mouth 2 (two) times daily. 60 capsule 6  . hydrALAZINE (APRESOLINE) 50 MG tablet Take 1 tablet (50 mg total) by mouth 3 (three) times daily. 90 tablet 6  . insulin glargine (LANTUS) 100 UNIT/ML injection Inject 0.12 mLs (12 Units total) into the skin every morning. (Patient taking differently: Inject 12 Units into the skin daily. ) 10 mL 11  .  insulin lispro (HUMALOG KWIKPEN) 100 UNIT/ML KiwkPen Inject 6 Units into the skin daily before supper.    . iron polysaccharides (NIFEREX) 150 MG capsule Take 150 mg by mouth daily.    Marland Kitchen warfarin (COUMADIN) 7.5 MG tablet Take 1 tablet tonight then resume 1/2 tablet daily except 1 tablet on Mondays, Wednesdays and Fridays 30 tablet 4   No current facility-administered medications for this visit.     Past Medical History:  Diagnosis Date  . Anemia   . Arthritis    HANDS  . CHF (congestive heart failure) ([place])   . Chronic kidney disease 04/2013   . COPD (chronic obstructive pulmonary disease) ([place])   . DM2 (diabetes mellitus, type 2) ([place])   . HTN (hypertension)   . Paroxysmal atrial fibrillation ([facility])   . Shortness of breath     Past Surgical History:  Procedure Laterality Date  . VEIN SURGERY      Social History   Socioeconomic History  . Marital status: Divorced    Spouse name: Not on file  . Number of children: Not on file  . Years of education: Not on file  . Highest education level: Not on file  Social Needs  . Financial resource strain: Not on file  . Food insecurity - worry: Not on file  . Food insecurity - inability: Not on file  . Transportation needs - medical: Not on file  . Transportation needs - non-medical: Not on file  Occupational History  . Not on file  Tobacco Use  . Smoking status: Former Smoker  Packs/day: 1.00    Years: 29.00    Pack years: 29.00    Types: Cigarettes    Start date: 07/19/1955    Last attempt to quit: 02/21/1984    Years since quitting: 33.0  . Smokeless tobacco: Never Used  Substance and Sexual Activity  . Alcohol use: No    Alcohol/week: 0.0 oz  . Drug use: No  . Sexual activity: No  Other Topics Concern  . Not on file  Social History Narrative  . Not on file     Vitals:   03/20/17 0832  BP: 140/64  Pulse: 84  SpO2: 98%  Weight: 189 lb (85.7 kg)  Height: 5' (1.524 m)    Wt Readings from Last 3 Encounters:    03/20/17 189 lb (85.7 kg)  03/14/16 199 lb (90.3 kg)  09/07/15 201 lb (91.2 kg)     PHYSICAL EXAM General: NAD, using oxygen by nasal cannula. HEENT: Normal. Neck: No JVD, no thyromegaly. Lungs: Diminished sounds throughout, no wheezes or crackles. CV: Regular rate and rhythm, normal S1/S2, no G9/F6, 1/6 systolic murmur over left upper sternal border.  Chronic bilateral lower extremity edema, nonpitting. Abdomen: Soft, obese.  Neurologic: Alert and oriented.  Psych: Normal affect. Skin: Normal. Musculoskeletal: No gross deformities.    ECG: Most recent ECG reviewed.   Labs: Lab Results  Component Value Date/Time   K 5.0 08/23/2015 10:10 AM   BUN 67 (H) 08/23/2015 10:10 AM   CREATININE 1.43 (H) 08/23/2015 10:10 AM   ALT 14 08/20/2015 06:17 PM   TSH 9.924 (H) 04/29/2013 05:40 AM   HGB 9.2 (L) 08/20/2015 06:17 PM     Lipids: Lab Results  Component Value Date/Time   LDLCALC 74 08/21/2015 05:36 AM   CHOL 129 08/21/2015 05:36 AM   TRIG 99 08/21/2015 05:36 AM   HDL 35 (L) 08/21/2015 05:36 AM       ASSESSMENT AND PLAN: 1.  Paroxysmal atrial fibrillation: Currently in sinus rhythm.  Symptomatically stable.  Anticoagulated with warfarin.  2.  Chronic diastolic heart failure: She has grade 2 diastolic dysfunction.  Weight is down 10 pounds in the last year since her visit.  Symptomatically stable.  Continue Lasix 40 mg daily.  I will obtain a copy of most recent basic metabolic panel.  3.  Chronic hypertension: Blood pressure is mildly elevated.  No changes at this time.    Disposition: Follow up 1 year   Kate Sable, M.D., F.A.C.C.

## 2017-03-20 NOTE — Patient Instructions (Signed)

## 2017-03-22 DIAGNOSIS — E119 Type 2 diabetes mellitus without complications: Secondary | ICD-10-CM | POA: Diagnosis not present

## 2017-03-22 DIAGNOSIS — I10 Essential (primary) hypertension: Secondary | ICD-10-CM | POA: Diagnosis not present

## 2017-03-22 DIAGNOSIS — J449 Chronic obstructive pulmonary disease, unspecified: Secondary | ICD-10-CM | POA: Diagnosis not present

## 2017-03-22 DIAGNOSIS — I509 Heart failure, unspecified: Secondary | ICD-10-CM | POA: Diagnosis not present

## 2017-04-06 ENCOUNTER — Other Ambulatory Visit: Payer: Self-pay | Admitting: *Deleted

## 2017-04-06 MED ORDER — LISINOPRIL 20 MG PO TABS: 20.0000 mg | ORAL_TABLET | Freq: Every day | ORAL | Status: AC

## 2017-04-06 MED ORDER — FUROSEMIDE 40 MG PO TABS: 40.0000 mg | ORAL_TABLET | Freq: Two times a day (BID) | ORAL | Status: AC

## 2017-04-06 MED ORDER — ACETAMINOPHEN 500 MG PO TABS: 500.0000 mg | ORAL_TABLET | Freq: Four times a day (QID) | ORAL | Status: DC | PRN

## 2017-04-06 MED ORDER — GABAPENTIN 300 MG PO CAPS: 300.0000 mg | ORAL_CAPSULE | Freq: Three times a day (TID) | ORAL | Status: AC

## 2017-04-06 MED ORDER — LEVOTHYROXINE SODIUM 150 MCG PO TABS: 150.0000 ug | ORAL_TABLET | Freq: Every day | ORAL | Status: DC

## 2017-04-06 MED ORDER — HYDRALAZINE HCL 25 MG PO TABS: 25.0000 mg | ORAL_TABLET | Freq: Three times a day (TID) | ORAL | Status: DC

## 2017-04-10 ENCOUNTER — Encounter: Payer: Self-pay | Admitting: Cardiovascular Disease

## 2017-04-10 ENCOUNTER — Ambulatory Visit (INDEPENDENT_AMBULATORY_CARE_PROVIDER_SITE_OTHER): Payer: Medicare Other | Admitting: *Deleted

## 2017-04-10 DIAGNOSIS — I4891 Unspecified atrial fibrillation: Secondary | ICD-10-CM

## 2017-04-10 DIAGNOSIS — Z5181 Encounter for therapeutic drug level monitoring: Secondary | ICD-10-CM | POA: Diagnosis not present

## 2017-04-10 LAB — POCT INR: INR: 1.7

## 2017-04-10 MED ORDER — WARFARIN SODIUM 7.5 MG PO TABS: ORAL_TABLET | ORAL | 4 refills | Status: DC

## 2017-04-10 NOTE — Patient Instructions (Signed)
Take coumadin 1 tablet tonight then increase dose to 1 tablet daily except 1/2 tablet on Sundays, Tuesdays and Thursdays  Recheck in 2.5 weeks

## 2017-04-17 DIAGNOSIS — I509 Heart failure, unspecified: Secondary | ICD-10-CM | POA: Diagnosis not present

## 2017-04-17 DIAGNOSIS — J449 Chronic obstructive pulmonary disease, unspecified: Secondary | ICD-10-CM | POA: Diagnosis not present

## 2017-04-17 DIAGNOSIS — E119 Type 2 diabetes mellitus without complications: Secondary | ICD-10-CM | POA: Diagnosis not present

## 2017-04-17 DIAGNOSIS — I10 Essential (primary) hypertension: Secondary | ICD-10-CM | POA: Diagnosis not present

## 2017-04-19 DIAGNOSIS — E119 Type 2 diabetes mellitus without complications: Secondary | ICD-10-CM | POA: Diagnosis not present

## 2017-04-19 DIAGNOSIS — H40022 Open angle with borderline findings, high risk, left eye: Secondary | ICD-10-CM | POA: Diagnosis not present

## 2017-04-26 ENCOUNTER — Ambulatory Visit (INDEPENDENT_AMBULATORY_CARE_PROVIDER_SITE_OTHER): Payer: Medicare Other | Admitting: *Deleted

## 2017-04-26 DIAGNOSIS — Z5181 Encounter for therapeutic drug level monitoring: Secondary | ICD-10-CM

## 2017-04-26 DIAGNOSIS — I4891 Unspecified atrial fibrillation: Secondary | ICD-10-CM

## 2017-04-26 LAB — POCT INR: INR: 1.6

## 2017-04-26 MED ORDER — WARFARIN SODIUM 7.5 MG PO TABS: ORAL_TABLET | ORAL | 4 refills | Status: DC

## 2017-04-26 NOTE — Patient Instructions (Signed)
Increase coumadin to 1 tablet daily except 1/2 tablet on Sundays and Wednesdays  Recheck in 2.5 weeks

## 2017-05-09 DIAGNOSIS — E1151 Type 2 diabetes mellitus with diabetic peripheral angiopathy without gangrene: Secondary | ICD-10-CM | POA: Diagnosis not present

## 2017-05-09 DIAGNOSIS — E1140 Type 2 diabetes mellitus with diabetic neuropathy, unspecified: Secondary | ICD-10-CM | POA: Diagnosis not present

## 2017-05-15 ENCOUNTER — Ambulatory Visit (INDEPENDENT_AMBULATORY_CARE_PROVIDER_SITE_OTHER): Payer: Medicare Other | Admitting: *Deleted

## 2017-05-15 DIAGNOSIS — I4891 Unspecified atrial fibrillation: Secondary | ICD-10-CM | POA: Diagnosis not present

## 2017-05-15 DIAGNOSIS — Z5181 Encounter for therapeutic drug level monitoring: Secondary | ICD-10-CM

## 2017-05-15 LAB — POCT INR: INR: 2

## 2017-05-15 NOTE — Patient Instructions (Signed)
Continue coumadin 1 tablet daily except 1/2 tablet on Sundays and Wednesdays  Recheck in 4 weeks

## 2017-05-16 DIAGNOSIS — I130 Hypertensive heart and chronic kidney disease with heart failure and stage 1 through stage 4 chronic kidney disease, or unspecified chronic kidney disease: Secondary | ICD-10-CM | POA: Diagnosis not present

## 2017-05-16 DIAGNOSIS — Z23 Encounter for immunization: Secondary | ICD-10-CM | POA: Diagnosis not present

## 2017-05-16 DIAGNOSIS — E11649 Type 2 diabetes mellitus with hypoglycemia without coma: Secondary | ICD-10-CM | POA: Diagnosis not present

## 2017-05-16 DIAGNOSIS — Z87891 Personal history of nicotine dependence: Secondary | ICD-10-CM | POA: Diagnosis not present

## 2017-05-16 DIAGNOSIS — I2510 Atherosclerotic heart disease of native coronary artery without angina pectoris: Secondary | ICD-10-CM | POA: Diagnosis not present

## 2017-05-16 DIAGNOSIS — E1122 Type 2 diabetes mellitus with diabetic chronic kidney disease: Secondary | ICD-10-CM | POA: Diagnosis not present

## 2017-05-16 DIAGNOSIS — Z794 Long term (current) use of insulin: Secondary | ICD-10-CM | POA: Diagnosis not present

## 2017-05-16 DIAGNOSIS — I509 Heart failure, unspecified: Secondary | ICD-10-CM | POA: Diagnosis not present

## 2017-05-16 DIAGNOSIS — Z7901 Long term (current) use of anticoagulants: Secondary | ICD-10-CM | POA: Diagnosis not present

## 2017-05-16 DIAGNOSIS — Z79899 Other long term (current) drug therapy: Secondary | ICD-10-CM | POA: Diagnosis not present

## 2017-05-16 DIAGNOSIS — N184 Chronic kidney disease, stage 4 (severe): Secondary | ICD-10-CM | POA: Diagnosis not present

## 2017-05-17 DIAGNOSIS — E11649 Type 2 diabetes mellitus with hypoglycemia without coma: Secondary | ICD-10-CM | POA: Diagnosis not present

## 2017-05-17 DIAGNOSIS — I130 Hypertensive heart and chronic kidney disease with heart failure and stage 1 through stage 4 chronic kidney disease, or unspecified chronic kidney disease: Secondary | ICD-10-CM | POA: Diagnosis not present

## 2017-05-17 DIAGNOSIS — I2510 Atherosclerotic heart disease of native coronary artery without angina pectoris: Secondary | ICD-10-CM | POA: Diagnosis not present

## 2017-05-17 DIAGNOSIS — E1122 Type 2 diabetes mellitus with diabetic chronic kidney disease: Secondary | ICD-10-CM | POA: Diagnosis not present

## 2017-05-23 DIAGNOSIS — E1122 Type 2 diabetes mellitus with diabetic chronic kidney disease: Secondary | ICD-10-CM | POA: Diagnosis not present

## 2017-05-23 DIAGNOSIS — I480 Paroxysmal atrial fibrillation: Secondary | ICD-10-CM | POA: Diagnosis not present

## 2017-05-23 DIAGNOSIS — Z6841 Body Mass Index (BMI) 40.0 and over, adult: Secondary | ICD-10-CM | POA: Diagnosis not present

## 2017-05-23 DIAGNOSIS — I10 Essential (primary) hypertension: Secondary | ICD-10-CM | POA: Diagnosis not present

## 2017-05-23 DIAGNOSIS — I509 Heart failure, unspecified: Secondary | ICD-10-CM | POA: Diagnosis not present

## 2017-05-23 DIAGNOSIS — N183 Chronic kidney disease, stage 3 (moderate): Secondary | ICD-10-CM | POA: Diagnosis not present

## 2017-05-23 DIAGNOSIS — Z299 Encounter for prophylactic measures, unspecified: Secondary | ICD-10-CM | POA: Diagnosis not present

## 2017-05-23 DIAGNOSIS — J449 Chronic obstructive pulmonary disease, unspecified: Secondary | ICD-10-CM | POA: Diagnosis not present

## 2017-05-23 DIAGNOSIS — E1165 Type 2 diabetes mellitus with hyperglycemia: Secondary | ICD-10-CM | POA: Diagnosis not present

## 2017-05-31 ENCOUNTER — Telehealth: Payer: Self-pay | Admitting: *Deleted

## 2017-05-31 DIAGNOSIS — H40022 Open angle with borderline findings, high risk, left eye: Secondary | ICD-10-CM | POA: Diagnosis not present

## 2017-05-31 NOTE — Telephone Encounter (Signed)
Called and spoke with [place].  She was at the facility Mrs. Vanantwerp lives at yesterday.  Per the [facility] pt had not received coumadin in 4 days.  The facility did get coumadin from the pharmacy yesterday.  Varney Biles wanted to know if pt missing doses had been reported to me and what the potential outcome of missing doses could be.  Told her I had not been informed of missing doses and outcome would be increased risk of blood clot or stroke.  She will report back to the facility that I need to be notified if pt misses any coumadin doses for dose adjustment.

## 2017-05-31 NOTE — Telephone Encounter (Signed)
Adline Peals with [place] regulations. Needs to speak with Coumdin Nurse in regards to patient. Please call [phone].

## 2017-06-12 ENCOUNTER — Ambulatory Visit (INDEPENDENT_AMBULATORY_CARE_PROVIDER_SITE_OTHER): Payer: Medicare Other | Admitting: *Deleted

## 2017-06-12 DIAGNOSIS — Z5181 Encounter for therapeutic drug level monitoring: Secondary | ICD-10-CM | POA: Diagnosis not present

## 2017-06-12 DIAGNOSIS — I4891 Unspecified atrial fibrillation: Secondary | ICD-10-CM | POA: Diagnosis not present

## 2017-06-12 LAB — POCT INR: INR: 1.6

## 2017-06-12 MED ORDER — WARFARIN SODIUM 7.5 MG PO TABS: ORAL_TABLET | ORAL | 4 refills | Status: DC

## 2017-06-12 NOTE — Patient Instructions (Signed)
Increase coumadin to 1 tablet daily except 1/2 tablet on Sundays  Recheck in 3 weeks

## 2017-06-14 DIAGNOSIS — J449 Chronic obstructive pulmonary disease, unspecified: Secondary | ICD-10-CM | POA: Diagnosis not present

## 2017-06-14 DIAGNOSIS — E1122 Type 2 diabetes mellitus with diabetic chronic kidney disease: Secondary | ICD-10-CM | POA: Diagnosis not present

## 2017-06-14 DIAGNOSIS — I509 Heart failure, unspecified: Secondary | ICD-10-CM | POA: Diagnosis not present

## 2017-06-14 DIAGNOSIS — Z299 Encounter for prophylactic measures, unspecified: Secondary | ICD-10-CM | POA: Diagnosis not present

## 2017-06-14 DIAGNOSIS — E1165 Type 2 diabetes mellitus with hyperglycemia: Secondary | ICD-10-CM | POA: Diagnosis not present

## 2017-06-14 DIAGNOSIS — I10 Essential (primary) hypertension: Secondary | ICD-10-CM | POA: Diagnosis not present

## 2017-06-14 DIAGNOSIS — I480 Paroxysmal atrial fibrillation: Secondary | ICD-10-CM | POA: Diagnosis not present

## 2017-06-14 DIAGNOSIS — N183 Chronic kidney disease, stage 3 (moderate): Secondary | ICD-10-CM | POA: Diagnosis not present

## 2017-06-29 DIAGNOSIS — B351 Tinea unguium: Secondary | ICD-10-CM | POA: Diagnosis not present

## 2017-06-29 DIAGNOSIS — M79674 Pain in right toe(s): Secondary | ICD-10-CM | POA: Diagnosis not present

## 2017-06-29 DIAGNOSIS — L84 Corns and callosities: Secondary | ICD-10-CM | POA: Diagnosis not present

## 2017-06-29 DIAGNOSIS — E0840 Diabetes mellitus due to underlying condition with diabetic neuropathy, unspecified: Secondary | ICD-10-CM | POA: Diagnosis not present

## 2017-06-29 DIAGNOSIS — M2012 Hallux valgus (acquired), left foot: Secondary | ICD-10-CM | POA: Diagnosis not present

## 2017-07-03 ENCOUNTER — Ambulatory Visit (INDEPENDENT_AMBULATORY_CARE_PROVIDER_SITE_OTHER): Payer: Medicare Other | Admitting: *Deleted

## 2017-07-03 DIAGNOSIS — I4891 Unspecified atrial fibrillation: Secondary | ICD-10-CM

## 2017-07-03 DIAGNOSIS — Z5181 Encounter for therapeutic drug level monitoring: Secondary | ICD-10-CM

## 2017-07-03 LAB — POCT INR: INR: 2.8

## 2017-07-03 NOTE — Patient Instructions (Signed)
Continue coumadin 1 tablet daily except 1/2 tablet on Sundays  Recheck in 4 weeks

## 2017-07-19 DIAGNOSIS — J449 Chronic obstructive pulmonary disease, unspecified: Secondary | ICD-10-CM | POA: Diagnosis not present

## 2017-07-19 DIAGNOSIS — I10 Essential (primary) hypertension: Secondary | ICD-10-CM | POA: Diagnosis not present

## 2017-07-19 DIAGNOSIS — I509 Heart failure, unspecified: Secondary | ICD-10-CM | POA: Diagnosis not present

## 2017-07-19 DIAGNOSIS — E119 Type 2 diabetes mellitus without complications: Secondary | ICD-10-CM | POA: Diagnosis not present

## 2017-07-25 DIAGNOSIS — E1151 Type 2 diabetes mellitus with diabetic peripheral angiopathy without gangrene: Secondary | ICD-10-CM | POA: Diagnosis not present

## 2017-07-25 DIAGNOSIS — E1140 Type 2 diabetes mellitus with diabetic neuropathy, unspecified: Secondary | ICD-10-CM | POA: Diagnosis not present

## 2017-07-31 ENCOUNTER — Ambulatory Visit (INDEPENDENT_AMBULATORY_CARE_PROVIDER_SITE_OTHER): Payer: Medicare Other | Admitting: *Deleted

## 2017-07-31 DIAGNOSIS — Z5181 Encounter for therapeutic drug level monitoring: Secondary | ICD-10-CM

## 2017-07-31 DIAGNOSIS — I4891 Unspecified atrial fibrillation: Secondary | ICD-10-CM | POA: Diagnosis not present

## 2017-07-31 LAB — POCT INR: INR: 2 (ref 2.0–3.0)

## 2017-07-31 NOTE — Patient Instructions (Signed)
Continue coumadin 1 tablet daily except 1/2 tablet on Sundays  Recheck in 4 weeks

## 2017-08-14 DIAGNOSIS — Z111 Encounter for screening for respiratory tuberculosis: Secondary | ICD-10-CM | POA: Diagnosis not present

## 2017-08-14 DIAGNOSIS — Z299 Encounter for prophylactic measures, unspecified: Secondary | ICD-10-CM | POA: Diagnosis not present

## 2017-08-14 DIAGNOSIS — I480 Paroxysmal atrial fibrillation: Secondary | ICD-10-CM | POA: Diagnosis not present

## 2017-08-14 DIAGNOSIS — I10 Essential (primary) hypertension: Secondary | ICD-10-CM | POA: Diagnosis not present

## 2017-08-14 DIAGNOSIS — J449 Chronic obstructive pulmonary disease, unspecified: Secondary | ICD-10-CM | POA: Diagnosis not present

## 2017-08-14 DIAGNOSIS — I509 Heart failure, unspecified: Secondary | ICD-10-CM | POA: Diagnosis not present

## 2017-08-14 DIAGNOSIS — Z6841 Body Mass Index (BMI) 40.0 and over, adult: Secondary | ICD-10-CM | POA: Diagnosis not present

## 2017-08-28 ENCOUNTER — Ambulatory Visit (INDEPENDENT_AMBULATORY_CARE_PROVIDER_SITE_OTHER): Payer: Medicare Other | Admitting: *Deleted

## 2017-08-28 DIAGNOSIS — Z5181 Encounter for therapeutic drug level monitoring: Secondary | ICD-10-CM

## 2017-08-28 DIAGNOSIS — I4891 Unspecified atrial fibrillation: Secondary | ICD-10-CM

## 2017-08-28 LAB — POCT INR: INR: 3 (ref 2.0–3.0)

## 2017-08-28 NOTE — Patient Instructions (Addendum)
Continue coumadin 1 tablet daily except 1/2 tablet on Sundays  Recheck in 6 weeks

## 2017-09-18 DIAGNOSIS — E119 Type 2 diabetes mellitus without complications: Secondary | ICD-10-CM | POA: Diagnosis not present

## 2017-09-18 DIAGNOSIS — I10 Essential (primary) hypertension: Secondary | ICD-10-CM | POA: Diagnosis not present

## 2017-09-18 DIAGNOSIS — I509 Heart failure, unspecified: Secondary | ICD-10-CM | POA: Diagnosis not present

## 2017-09-18 DIAGNOSIS — J449 Chronic obstructive pulmonary disease, unspecified: Secondary | ICD-10-CM | POA: Diagnosis not present

## 2017-09-20 DIAGNOSIS — I509 Heart failure, unspecified: Secondary | ICD-10-CM | POA: Diagnosis not present

## 2017-09-20 DIAGNOSIS — E1122 Type 2 diabetes mellitus with diabetic chronic kidney disease: Secondary | ICD-10-CM | POA: Diagnosis not present

## 2017-09-20 DIAGNOSIS — E1165 Type 2 diabetes mellitus with hyperglycemia: Secondary | ICD-10-CM | POA: Diagnosis not present

## 2017-09-20 DIAGNOSIS — N183 Chronic kidney disease, stage 3 (moderate): Secondary | ICD-10-CM | POA: Diagnosis not present

## 2017-09-20 DIAGNOSIS — Z299 Encounter for prophylactic measures, unspecified: Secondary | ICD-10-CM | POA: Diagnosis not present

## 2017-09-20 DIAGNOSIS — I10 Essential (primary) hypertension: Secondary | ICD-10-CM | POA: Diagnosis not present

## 2017-09-20 DIAGNOSIS — Z6841 Body Mass Index (BMI) 40.0 and over, adult: Secondary | ICD-10-CM | POA: Diagnosis not present

## 2017-09-25 ENCOUNTER — Telehealth: Payer: Self-pay | Admitting: Cardiovascular Disease

## 2017-09-25 DIAGNOSIS — I10 Essential (primary) hypertension: Secondary | ICD-10-CM | POA: Diagnosis not present

## 2017-09-25 DIAGNOSIS — E1122 Type 2 diabetes mellitus with diabetic chronic kidney disease: Secondary | ICD-10-CM | POA: Diagnosis not present

## 2017-09-25 DIAGNOSIS — Z6841 Body Mass Index (BMI) 40.0 and over, adult: Secondary | ICD-10-CM | POA: Diagnosis not present

## 2017-09-25 DIAGNOSIS — Z299 Encounter for prophylactic measures, unspecified: Secondary | ICD-10-CM | POA: Diagnosis not present

## 2017-09-25 DIAGNOSIS — N183 Chronic kidney disease, stage 3 (moderate): Secondary | ICD-10-CM | POA: Diagnosis not present

## 2017-09-25 DIAGNOSIS — L03119 Cellulitis of unspecified part of limb: Secondary | ICD-10-CM | POA: Diagnosis not present

## 2017-09-25 NOTE — Telephone Encounter (Signed)
Received telephone call from [place] regards to Haley Cooper falling the other day. States that she injured her leg. Was seen by PCP today (Dr. Woody Seller) . Patient was placed on Augmentin 875 mg for 10 days.  Dr. Woody Seller advised patient to contact coumdin clinic.  Please call [phone].

## 2017-09-25 NOTE — Telephone Encounter (Signed)
Returned call to pt's caregiver, advised Augementin 875mg  does not interfere with Warfarin.  Advised to have pt continue on same dosage of Warfarin and keep previously scheduled follow-up, and call with any additional medication changes.

## 2017-09-27 DIAGNOSIS — H40022 Open angle with borderline findings, high risk, left eye: Secondary | ICD-10-CM | POA: Diagnosis not present

## 2017-10-04 DIAGNOSIS — E1151 Type 2 diabetes mellitus with diabetic peripheral angiopathy without gangrene: Secondary | ICD-10-CM | POA: Diagnosis not present

## 2017-10-04 DIAGNOSIS — E1140 Type 2 diabetes mellitus with diabetic neuropathy, unspecified: Secondary | ICD-10-CM | POA: Diagnosis not present

## 2017-10-09 ENCOUNTER — Ambulatory Visit (INDEPENDENT_AMBULATORY_CARE_PROVIDER_SITE_OTHER): Payer: Medicare Other | Admitting: Pharmacist

## 2017-10-09 DIAGNOSIS — Z299 Encounter for prophylactic measures, unspecified: Secondary | ICD-10-CM | POA: Diagnosis not present

## 2017-10-09 DIAGNOSIS — I10 Essential (primary) hypertension: Secondary | ICD-10-CM | POA: Diagnosis not present

## 2017-10-09 DIAGNOSIS — J449 Chronic obstructive pulmonary disease, unspecified: Secondary | ICD-10-CM | POA: Diagnosis not present

## 2017-10-09 DIAGNOSIS — Z5181 Encounter for therapeutic drug level monitoring: Secondary | ICD-10-CM

## 2017-10-09 DIAGNOSIS — I4891 Unspecified atrial fibrillation: Secondary | ICD-10-CM

## 2017-10-09 DIAGNOSIS — I509 Heart failure, unspecified: Secondary | ICD-10-CM | POA: Diagnosis not present

## 2017-10-09 DIAGNOSIS — E1165 Type 2 diabetes mellitus with hyperglycemia: Secondary | ICD-10-CM | POA: Diagnosis not present

## 2017-10-09 DIAGNOSIS — I272 Pulmonary hypertension, unspecified: Secondary | ICD-10-CM | POA: Diagnosis not present

## 2017-10-09 LAB — POCT INR: INR: 2.8 (ref 2.0–3.0)

## 2017-10-09 NOTE — Patient Instructions (Signed)
Description   Continue coumadin 1 tablet daily except 1/2 tablet on Sundays  Recheck in 6 weeks

## 2017-10-18 DIAGNOSIS — E119 Type 2 diabetes mellitus without complications: Secondary | ICD-10-CM | POA: Diagnosis not present

## 2017-10-18 DIAGNOSIS — I509 Heart failure, unspecified: Secondary | ICD-10-CM | POA: Diagnosis not present

## 2017-10-18 DIAGNOSIS — J449 Chronic obstructive pulmonary disease, unspecified: Secondary | ICD-10-CM | POA: Diagnosis not present

## 2017-10-18 DIAGNOSIS — I10 Essential (primary) hypertension: Secondary | ICD-10-CM | POA: Diagnosis not present

## 2017-11-20 ENCOUNTER — Ambulatory Visit (INDEPENDENT_AMBULATORY_CARE_PROVIDER_SITE_OTHER): Payer: Medicare Other | Admitting: *Deleted

## 2017-11-20 DIAGNOSIS — I4891 Unspecified atrial fibrillation: Secondary | ICD-10-CM

## 2017-11-20 DIAGNOSIS — Z5181 Encounter for therapeutic drug level monitoring: Secondary | ICD-10-CM

## 2017-11-20 LAB — POCT INR: INR: 3.8 — AB (ref 2.0–3.0)

## 2017-11-20 NOTE — Patient Instructions (Signed)
Hold coumadin tonight then resume 1 tablet daily except 1/2 tablet on Sundays  Recheck in 6 weeks

## 2017-12-11 ENCOUNTER — Ambulatory Visit (INDEPENDENT_AMBULATORY_CARE_PROVIDER_SITE_OTHER): Payer: Medicare Other | Admitting: *Deleted

## 2017-12-11 DIAGNOSIS — I4891 Unspecified atrial fibrillation: Secondary | ICD-10-CM

## 2017-12-11 DIAGNOSIS — Z5181 Encounter for therapeutic drug level monitoring: Secondary | ICD-10-CM | POA: Diagnosis not present

## 2017-12-11 LAB — POCT INR: INR: 3.2 — AB (ref 2.0–3.0)

## 2017-12-11 MED ORDER — WARFARIN SODIUM 7.5 MG PO TABS: ORAL_TABLET | ORAL | 4 refills | Status: DC

## 2017-12-11 NOTE — Patient Instructions (Signed)
Hold coumadin tonight then decrease dose to 1 tablet daily except 1/2 tablet on Sundays and Thursdays Recheck in 4 weeks

## 2017-12-15 IMAGING — DX DG CHEST 2V
2 series · 2 of 2 positions shown · non-contrast
Comparison: 04/26/2013 chest radiograph.

CLINICAL DATA: Chest pain

EXAM:
CHEST  2 VIEW

[x chest ap]
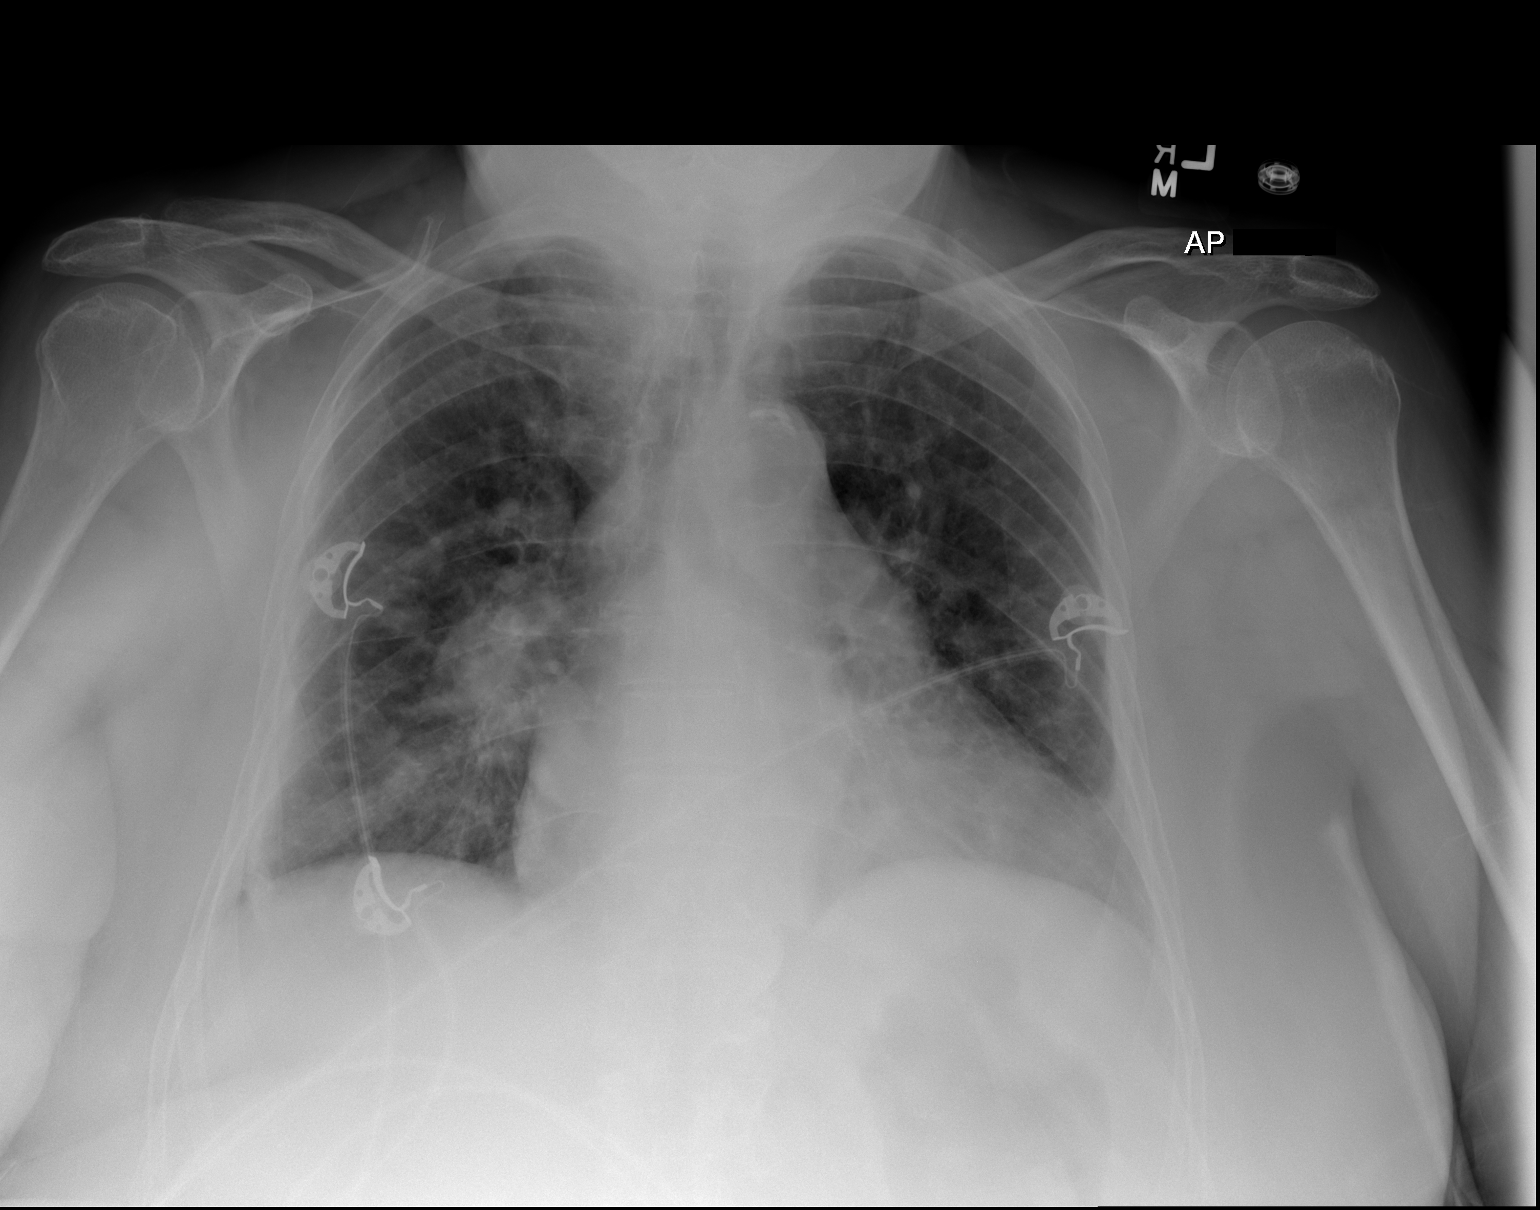

[w chest lat]
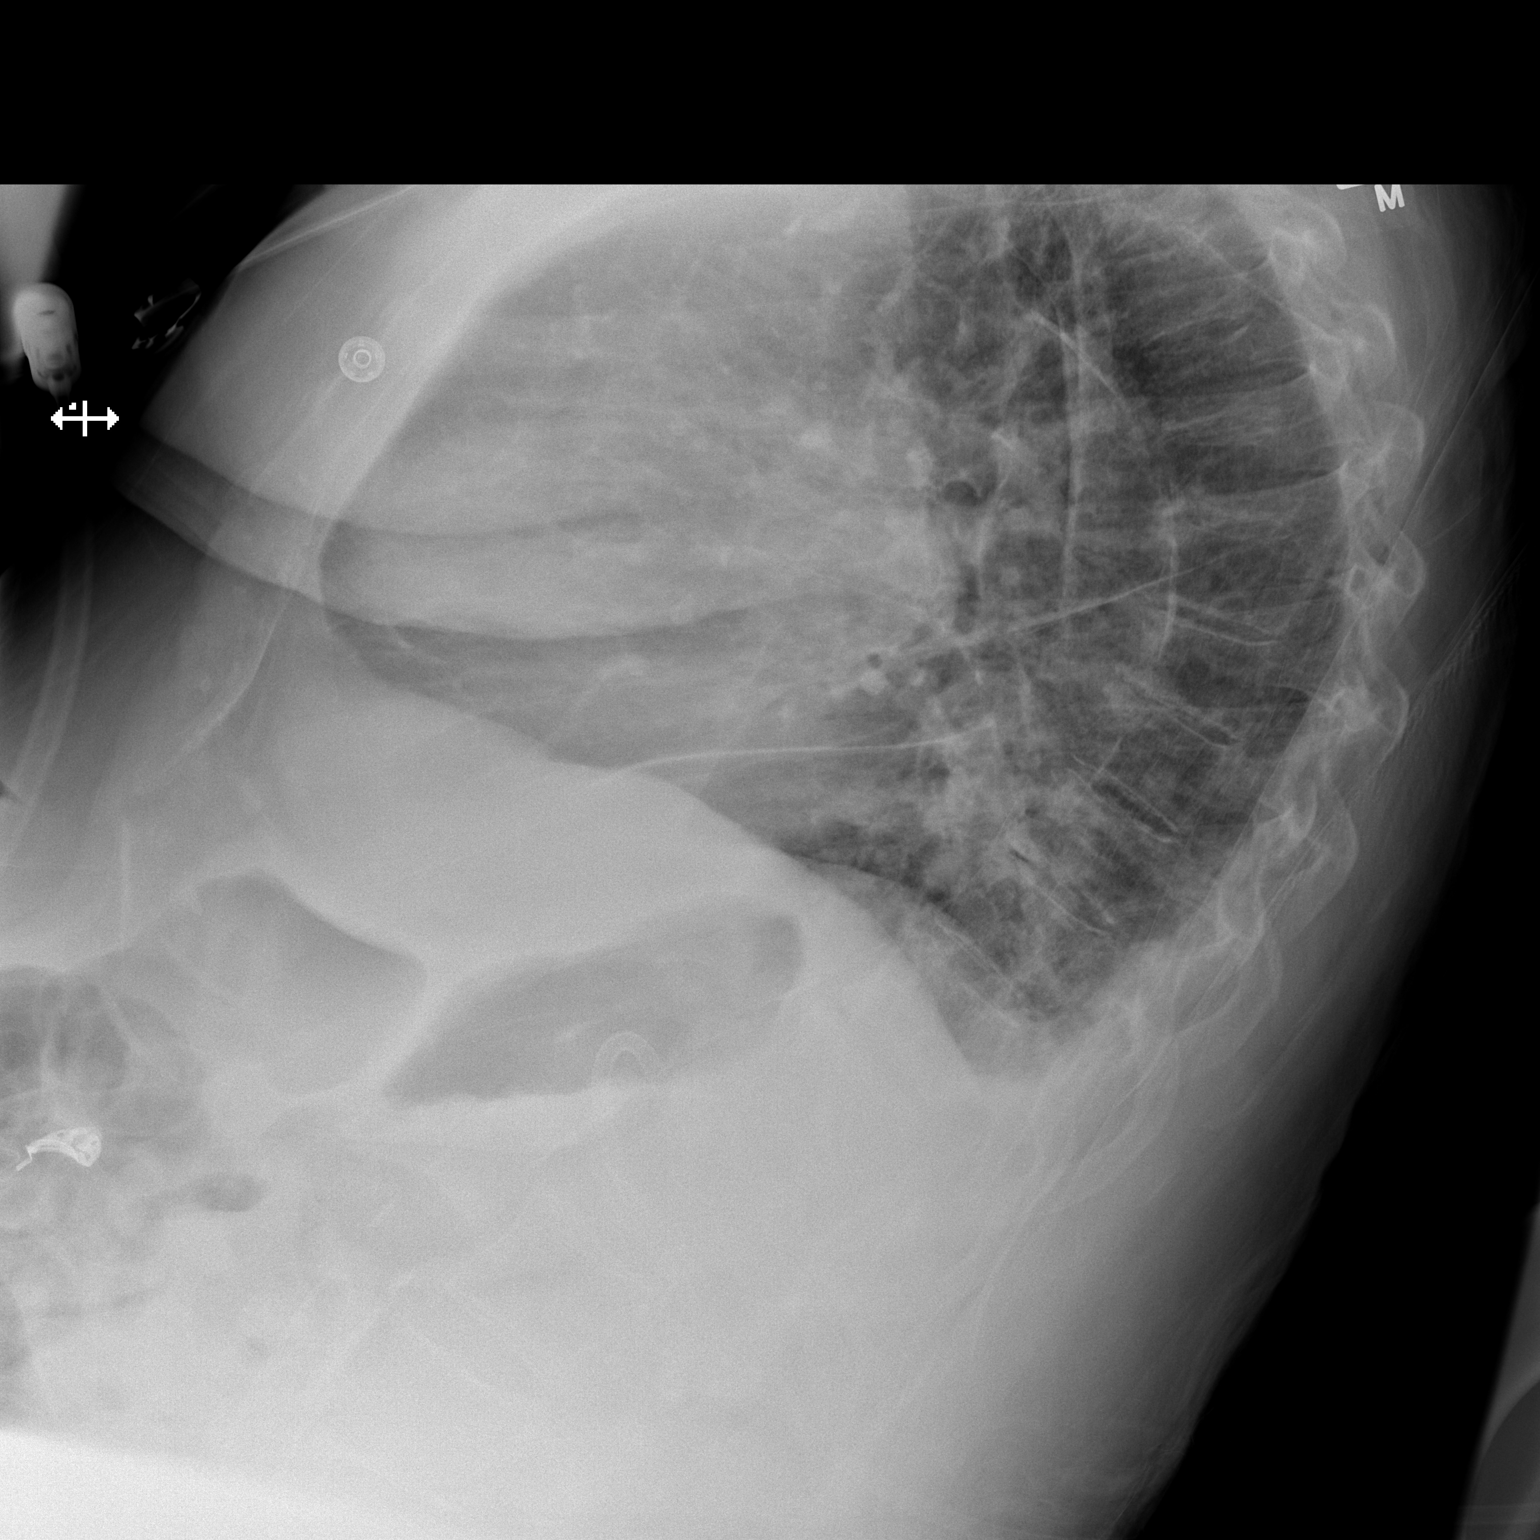

[2 of 2 positions shown; findings below may reference images not displayed]

FINDINGS: Stable cardiomediastinal silhouette with mild cardiomegaly and
symmetric hilar prominence. No pneumothorax. Trace bilateral pleural
effusions. Mild pulmonary edema. No consolidative airspace disease.
IMPRESSION: 1. Mild congestive heart failure.
2. Trace bilateral pleural effusions.
3. Stable symmetric hilar prominence, probably due to distended
pulmonary vasculature.

## 2017-12-18 DIAGNOSIS — E1140 Type 2 diabetes mellitus with diabetic neuropathy, unspecified: Secondary | ICD-10-CM | POA: Diagnosis not present

## 2017-12-18 DIAGNOSIS — E1151 Type 2 diabetes mellitus with diabetic peripheral angiopathy without gangrene: Secondary | ICD-10-CM | POA: Diagnosis not present

## 2017-12-26 DIAGNOSIS — E1122 Type 2 diabetes mellitus with diabetic chronic kidney disease: Secondary | ICD-10-CM | POA: Diagnosis not present

## 2017-12-26 DIAGNOSIS — I10 Essential (primary) hypertension: Secondary | ICD-10-CM | POA: Diagnosis not present

## 2017-12-26 DIAGNOSIS — E1165 Type 2 diabetes mellitus with hyperglycemia: Secondary | ICD-10-CM | POA: Diagnosis not present

## 2017-12-26 DIAGNOSIS — N183 Chronic kidney disease, stage 3 (moderate): Secondary | ICD-10-CM | POA: Diagnosis not present

## 2017-12-26 DIAGNOSIS — I509 Heart failure, unspecified: Secondary | ICD-10-CM | POA: Diagnosis not present

## 2017-12-26 DIAGNOSIS — Z299 Encounter for prophylactic measures, unspecified: Secondary | ICD-10-CM | POA: Diagnosis not present

## 2017-12-26 DIAGNOSIS — Z6841 Body Mass Index (BMI) 40.0 and over, adult: Secondary | ICD-10-CM | POA: Diagnosis not present

## 2018-01-08 ENCOUNTER — Ambulatory Visit (INDEPENDENT_AMBULATORY_CARE_PROVIDER_SITE_OTHER): Payer: Medicare Other | Admitting: *Deleted

## 2018-01-08 DIAGNOSIS — I4891 Unspecified atrial fibrillation: Secondary | ICD-10-CM

## 2018-01-08 DIAGNOSIS — Z5181 Encounter for therapeutic drug level monitoring: Secondary | ICD-10-CM

## 2018-01-08 LAB — POCT INR: INR: 3.6 — AB (ref 2.0–3.0)

## 2018-01-08 MED ORDER — WARFARIN SODIUM 7.5 MG PO TABS: ORAL_TABLET | ORAL | 4 refills | Status: DC

## 2018-01-08 NOTE — Patient Instructions (Signed)
Hold coumadin tonight then decrease dose to 1 tablet daily except 1/2 tablet on Sundays, Tuesdays and Thursdays Recheck in 3 weeks

## 2018-01-22 DIAGNOSIS — Z6841 Body Mass Index (BMI) 40.0 and over, adult: Secondary | ICD-10-CM | POA: Diagnosis not present

## 2018-01-22 DIAGNOSIS — E1165 Type 2 diabetes mellitus with hyperglycemia: Secondary | ICD-10-CM | POA: Diagnosis not present

## 2018-01-22 DIAGNOSIS — J449 Chronic obstructive pulmonary disease, unspecified: Secondary | ICD-10-CM | POA: Diagnosis not present

## 2018-01-22 DIAGNOSIS — N183 Chronic kidney disease, stage 3 (moderate): Secondary | ICD-10-CM | POA: Diagnosis not present

## 2018-01-22 DIAGNOSIS — E1122 Type 2 diabetes mellitus with diabetic chronic kidney disease: Secondary | ICD-10-CM | POA: Diagnosis not present

## 2018-01-22 DIAGNOSIS — I10 Essential (primary) hypertension: Secondary | ICD-10-CM | POA: Diagnosis not present

## 2018-01-22 DIAGNOSIS — Z299 Encounter for prophylactic measures, unspecified: Secondary | ICD-10-CM | POA: Diagnosis not present

## 2018-01-29 ENCOUNTER — Ambulatory Visit (INDEPENDENT_AMBULATORY_CARE_PROVIDER_SITE_OTHER): Payer: Medicare Other | Admitting: *Deleted

## 2018-01-29 DIAGNOSIS — I4891 Unspecified atrial fibrillation: Secondary | ICD-10-CM | POA: Diagnosis not present

## 2018-01-29 DIAGNOSIS — Z5181 Encounter for therapeutic drug level monitoring: Secondary | ICD-10-CM

## 2018-01-29 LAB — POCT INR: INR: 2 (ref 2.0–3.0)

## 2018-01-29 NOTE — Patient Instructions (Signed)
Continue coumadin 1 tablet daily except 1/2 tablet on Sundays, Tuesdays and Thursdays.  Recheck in 4 weeks 

## 2018-01-31 DIAGNOSIS — H54414A Blindness right eye category 4, normal vision left eye: Secondary | ICD-10-CM | POA: Diagnosis not present

## 2018-01-31 DIAGNOSIS — H40022 Open angle with borderline findings, high risk, left eye: Secondary | ICD-10-CM | POA: Diagnosis not present

## 2018-02-26 ENCOUNTER — Ambulatory Visit (INDEPENDENT_AMBULATORY_CARE_PROVIDER_SITE_OTHER): Payer: Medicare Other | Admitting: Pharmacist

## 2018-02-26 DIAGNOSIS — I4891 Unspecified atrial fibrillation: Secondary | ICD-10-CM | POA: Diagnosis not present

## 2018-02-26 DIAGNOSIS — Z111 Encounter for screening for respiratory tuberculosis: Secondary | ICD-10-CM | POA: Diagnosis not present

## 2018-02-26 DIAGNOSIS — Z5181 Encounter for therapeutic drug level monitoring: Secondary | ICD-10-CM | POA: Diagnosis not present

## 2018-02-26 LAB — POCT INR: INR: 2.9 (ref 2.0–3.0)

## 2018-02-26 NOTE — Patient Instructions (Signed)
Description   Continue coumadin 1 tablet daily except 1/2 tablet on Sundays, Tuesdays and Thursdays Recheck in 4 weeks

## 2018-03-12 DIAGNOSIS — E1151 Type 2 diabetes mellitus with diabetic peripheral angiopathy without gangrene: Secondary | ICD-10-CM | POA: Diagnosis not present

## 2018-03-12 DIAGNOSIS — E1140 Type 2 diabetes mellitus with diabetic neuropathy, unspecified: Secondary | ICD-10-CM | POA: Diagnosis not present

## 2018-04-02 ENCOUNTER — Ambulatory Visit (INDEPENDENT_AMBULATORY_CARE_PROVIDER_SITE_OTHER): Payer: Medicare Other | Admitting: Cardiovascular Disease

## 2018-04-02 ENCOUNTER — Encounter: Payer: Self-pay | Admitting: Cardiovascular Disease

## 2018-04-02 ENCOUNTER — Ambulatory Visit (INDEPENDENT_AMBULATORY_CARE_PROVIDER_SITE_OTHER): Payer: Medicare Other | Admitting: Pharmacist

## 2018-04-02 VITALS — BP 132/69 | HR 62 | Ht 60.0 in | Wt 203.8 lb

## 2018-04-02 DIAGNOSIS — I10 Essential (primary) hypertension: Secondary | ICD-10-CM

## 2018-04-02 DIAGNOSIS — I5032 Chronic diastolic (congestive) heart failure: Secondary | ICD-10-CM | POA: Diagnosis not present

## 2018-04-02 DIAGNOSIS — I4891 Unspecified atrial fibrillation: Secondary | ICD-10-CM

## 2018-04-02 DIAGNOSIS — Z5181 Encounter for therapeutic drug level monitoring: Secondary | ICD-10-CM | POA: Diagnosis not present

## 2018-04-02 DIAGNOSIS — I480 Paroxysmal atrial fibrillation: Secondary | ICD-10-CM

## 2018-04-02 LAB — POCT INR: INR: 2.2 (ref 2.0–3.0)

## 2018-04-02 NOTE — Patient Instructions (Signed)
Your physician wants you to follow-up in: 1 YEAR WITH DR KONESWARAN You will receive a reminder letter in the mail two months in advance. If you don't receive a letter, please call our office to schedule the follow-up appointment.  Your physician recommends that you continue on your current medications as directed. Please refer to the Current Medication list given to you today.  Thank you for choosing [facility]!!    

## 2018-04-02 NOTE — Progress Notes (Signed)
SUBJECTIVE: The patient presents for routine follow-up.  She has paroxysmal atrial fibrillation, hypertension, and chronic diastolic heart failure.  ECG performed in the office today which I ordered and personally interpreted demonstrates normal sinus rhythm with no ischemic ST segment or T-wave abnormalities, nor any arrhythmias.  Echocardiogram 08/22/15 demonstrated normal left ventricular systolic function, LVEF 18-29%, moderate LVH, grade 2 diastolic dysfunction with elevated left ventricular filling pressures, severe left atrial and mild right ventricular and mild right atrial dilatation, moderate tricuspid regurgitation with moderate to severely elevated pulmonary pressures, 61 mmHg.  She has a history of chronic hypoxic respiratory failure and uses 2 L of oxygen throughout the day.  She also has chronic bilateral leg edema.  She stays active by volunteering at the [facility] in [place] 4 days/week.  She makes jewelry.  She denies chest pain, palpitations and any changes in chronic exertional dyspnea.  Chronic leg swelling is also stable.  She has no complaints today.  She told me she has now 241 grandchildren and great-grandchildren combined.  Review of Systems: As per "subjective", otherwise negative.  No Known Allergies  Current Outpatient Medications  Medication Sig Dispense Refill  . acetaminophen (TYLENOL) 500 MG tablet Take 1 tablet (500 mg total) by mouth every 6 (six) hours as needed.    . ALPRAZolam (XANAX) 0.25 MG tablet Take 0.25 mg by mouth 3 (three) times daily as needed for anxiety.    Marland Kitchen atorvastatin (LIPITOR) 20 MG tablet Take 20 mg by mouth at bedtime.     . furosemide (LASIX) 40 MG tablet Take 1 tablet (40 mg total) by mouth 2 (two) times daily.    Marland Kitchen gabapentin (NEURONTIN) 300 MG capsule Take 1 capsule (300 mg total) by mouth 3 (three) times daily.    . hydrALAZINE (APRESOLINE) 50 MG tablet Take 1 tablet by mouth 3 (three) times daily.    . insulin glargine  (LANTUS) 100 UNIT/ML injection Inject 0.12 mLs (12 Units total) into the skin every morning. (Patient taking differently: Inject 12 Units into the skin daily. ) 10 mL 11  . insulin lispro (HUMALOG KWIKPEN) 100 UNIT/ML KiwkPen Inject 6 Units into the skin daily before supper.    . iron polysaccharides (NIFEREX) 150 MG capsule Take 150 mg by mouth daily.    Marland Kitchen levothyroxine (SYNTHROID) 150 MCG tablet Take 1 tablet (150 mcg total) by mouth daily before breakfast.    . lisinopril (PRINIVIL,ZESTRIL) 20 MG tablet Take 1 tablet (20 mg total) by mouth daily.    Marland Kitchen warfarin (COUMADIN) 7.5 MG tablet Hold coumadin tonight then decrease dose to 1 tablet daily except 1/2 tablet on Sundays, Tuesdays and Thursdays 30 tablet 4   No current facility-administered medications for this visit.     Past Medical History:  Diagnosis Date  . Anemia   . Arthritis    HANDS  . CHF (congestive heart failure) ([place])   . Chronic kidney disease 04/2013   . COPD (chronic obstructive pulmonary disease) ([place])   . DM2 (diabetes mellitus, type 2) ([place])   . HTN (hypertension)   . Paroxysmal atrial fibrillation (HCC)   . Shortness of breath     Past Surgical History:  Procedure Laterality Date  . VEIN SURGERY      Social History   Socioeconomic History  . Marital status: Divorced    Spouse name: Not on file  . Number of children: Not on file  . Years of education: Not on file  . Highest education  level: Not on file  Occupational History  . Not on file  Social Needs  . Financial resource strain: Not on file  . Food insecurity:    Worry: Not on file    Inability: Not on file  . Transportation needs:    Medical: Not on file    Non-medical: Not on file  Tobacco Use  . Smoking status: Former Smoker    Packs/day: 1.00    Years: 29.00    Pack years: 29.00    Types: Cigarettes    Start date: 07/19/1955    Last attempt to quit: 02/21/1984    Years since quitting: 34.1  . Smokeless tobacco: Never Used  Substance  and Sexual Activity  . Alcohol use: No    Alcohol/week: 0.0 standard drinks  . Drug use: No  . Sexual activity: Never  Lifestyle  . Physical activity:    Days per week: Not on file    Minutes per session: Not on file  . Stress: Not on file  Relationships  . Social connections:    Talks on phone: Not on file    Gets together: Not on file    Attends religious service: Not on file    Active member of club or organization: Not on file    Attends meetings of clubs or organizations: Not on file    Relationship status: Not on file  . Intimate partner violence:    Fear of current or ex partner: Not on file    Emotionally abused: Not on file    Physically abused: Not on file    Forced sexual activity: Not on file  Other Topics Concern  . Not on file  Social History Narrative  . Not on file     Vitals:   04/02/18 0853  BP: 132/69  Pulse: 62  SpO2: 94%  Weight: 203 lb 12.8 oz (92.4 kg)  Height: 5' (1.524 m)    Wt Readings from Last 3 Encounters:  04/02/18 203 lb 12.8 oz (92.4 kg)  03/20/17 189 lb (85.7 kg)  03/14/16 199 lb (90.3 kg)     PHYSICAL EXAM General: NAD HEENT: Normal. Neck: No JVD, no thyromegaly. Lungs: Diffusely diminished breath sounds without crackles or wheezes. CV: Regular rate and rhythm, normal S1/S2, no H3/Z1, 1/6 systolic murmur over left upper sternal border.  Chronic bilateral lower extremity edema, nonpitting. Abdomen: Soft, nontender, no distention.  Neurologic: Alert and oriented.  Psych: Normal affect. Skin: Normal. Musculoskeletal: No gross deformities.    ECG: Reviewed above under Subjective   Labs: Lab Results  Component Value Date/Time   K 5.0 08/23/2015 10:10 AM   BUN 67 (H) 08/23/2015 10:10 AM   CREATININE 1.43 (H) 08/23/2015 10:10 AM   ALT 14 08/20/2015 06:17 PM   TSH 9.924 (H) 04/29/2013 05:40 AM   HGB 9.2 (L) 08/20/2015 06:17 PM     Lipids: Lab Results  Component Value Date/Time   LDLCALC 74 08/21/2015 05:36 AM    CHOL 129 08/21/2015 05:36 AM   TRIG 99 08/21/2015 05:36 AM   HDL 35 (L) 08/21/2015 05:36 AM       ASSESSMENT AND PLAN:  1.  Paroxysmal atrial fibrillation: Currently in sinus rhythm.  Symptomatically stable.  Anticoagulated with warfarin.  2.  Chronic diastolic heart failure: She has grade 2 diastolic dysfunction.  She takes Lasix 40 mg twice daily.  3.  Hypertension: Blood pressure is normal.  No changes to therapy.   Disposition: Follow up 1 year   Jamesetta So  Bronson Ing, M.D., F.A.C.C.

## 2018-04-02 NOTE — Patient Instructions (Signed)
Description   Continue coumadin 1 tablet daily except 1/2 tablet on Sundays, Tuesdays and Thursdays Recheck in 6 weeks

## 2018-04-30 DIAGNOSIS — J449 Chronic obstructive pulmonary disease, unspecified: Secondary | ICD-10-CM | POA: Diagnosis not present

## 2018-04-30 DIAGNOSIS — Z6841 Body Mass Index (BMI) 40.0 and over, adult: Secondary | ICD-10-CM | POA: Diagnosis not present

## 2018-04-30 DIAGNOSIS — Z299 Encounter for prophylactic measures, unspecified: Secondary | ICD-10-CM | POA: Diagnosis not present

## 2018-04-30 DIAGNOSIS — E1122 Type 2 diabetes mellitus with diabetic chronic kidney disease: Secondary | ICD-10-CM | POA: Diagnosis not present

## 2018-04-30 DIAGNOSIS — I272 Pulmonary hypertension, unspecified: Secondary | ICD-10-CM | POA: Diagnosis not present

## 2018-04-30 DIAGNOSIS — I10 Essential (primary) hypertension: Secondary | ICD-10-CM | POA: Diagnosis not present

## 2018-05-13 ENCOUNTER — Telehealth: Payer: Self-pay | Admitting: Pharmacist

## 2018-05-13 NOTE — Telephone Encounter (Signed)
1. Do you currently have a fever? No 2. Have you recently travelled on a cruise, internationally, or to [place], [place], [place], [place], [place], or [place], [place] ([place]) ? No 3. Have you been in contact with someone that is currently pending confirmation of Covid19 testing or has been confirmed to have the Covid19 virus?  No 4. Are you currently experiencing fatigue or cough? No  Pt. Advised that we are restricting visitors at this time and anyone present in the vehicle should meet the above criteria as well. Advised that visit will be at curbside for finger stick ONLY and will receive call with instructions. 

## 2018-05-14 ENCOUNTER — Ambulatory Visit (INDEPENDENT_AMBULATORY_CARE_PROVIDER_SITE_OTHER): Payer: Medicare Other | Admitting: *Deleted

## 2018-05-14 DIAGNOSIS — I4891 Unspecified atrial fibrillation: Secondary | ICD-10-CM

## 2018-05-14 DIAGNOSIS — Z5181 Encounter for therapeutic drug level monitoring: Secondary | ICD-10-CM

## 2018-05-14 LAB — POCT INR: INR: 2.4 (ref 2.0–3.0)

## 2018-05-14 NOTE — Patient Instructions (Signed)
Continue coumadin 1 tablet daily except 1/2 tablet on Sundays, Tuesdays and Thursdays Recheck in 8 weeks

## 2018-06-06 DIAGNOSIS — Z789 Other specified health status: Secondary | ICD-10-CM | POA: Diagnosis not present

## 2018-06-06 DIAGNOSIS — Z299 Encounter for prophylactic measures, unspecified: Secondary | ICD-10-CM | POA: Diagnosis not present

## 2018-06-06 DIAGNOSIS — I509 Heart failure, unspecified: Secondary | ICD-10-CM | POA: Diagnosis not present

## 2018-06-06 DIAGNOSIS — E875 Hyperkalemia: Secondary | ICD-10-CM | POA: Diagnosis not present

## 2018-06-06 DIAGNOSIS — J449 Chronic obstructive pulmonary disease, unspecified: Secondary | ICD-10-CM | POA: Diagnosis not present

## 2018-06-06 DIAGNOSIS — N183 Chronic kidney disease, stage 3 (moderate): Secondary | ICD-10-CM | POA: Diagnosis not present

## 2018-06-18 ENCOUNTER — Other Ambulatory Visit: Payer: Self-pay | Admitting: Cardiovascular Disease

## 2018-07-09 ENCOUNTER — Ambulatory Visit (INDEPENDENT_AMBULATORY_CARE_PROVIDER_SITE_OTHER): Payer: Medicare Other | Admitting: *Deleted

## 2018-07-09 DIAGNOSIS — Z5181 Encounter for therapeutic drug level monitoring: Secondary | ICD-10-CM | POA: Diagnosis not present

## 2018-07-09 DIAGNOSIS — I4891 Unspecified atrial fibrillation: Secondary | ICD-10-CM | POA: Diagnosis not present

## 2018-07-09 LAB — POCT INR: INR: 3.5 — AB (ref 2.0–3.0)

## 2018-07-09 NOTE — Patient Instructions (Signed)
Hold coumadin tonight then resume 1 tablet daily except 1/2 tablet on Sundays, Tuesdays and Thursdays Recheck in 6 weeks

## 2018-07-24 DIAGNOSIS — Z79899 Other long term (current) drug therapy: Secondary | ICD-10-CM | POA: Diagnosis not present

## 2018-07-24 DIAGNOSIS — Z6841 Body Mass Index (BMI) 40.0 and over, adult: Secondary | ICD-10-CM | POA: Diagnosis not present

## 2018-07-24 DIAGNOSIS — Z7189 Other specified counseling: Secondary | ICD-10-CM | POA: Diagnosis not present

## 2018-07-24 DIAGNOSIS — E039 Hypothyroidism, unspecified: Secondary | ICD-10-CM | POA: Diagnosis not present

## 2018-07-24 DIAGNOSIS — Z299 Encounter for prophylactic measures, unspecified: Secondary | ICD-10-CM | POA: Diagnosis not present

## 2018-07-24 DIAGNOSIS — R5383 Other fatigue: Secondary | ICD-10-CM | POA: Diagnosis not present

## 2018-07-24 DIAGNOSIS — I272 Pulmonary hypertension, unspecified: Secondary | ICD-10-CM | POA: Diagnosis not present

## 2018-07-24 DIAGNOSIS — Z1339 Encounter for screening examination for other mental health and behavioral disorders: Secondary | ICD-10-CM | POA: Diagnosis not present

## 2018-07-24 DIAGNOSIS — Z0000 Encounter for general adult medical examination without abnormal findings: Secondary | ICD-10-CM | POA: Diagnosis not present

## 2018-07-24 DIAGNOSIS — Z1331 Encounter for screening for depression: Secondary | ICD-10-CM | POA: Diagnosis not present

## 2018-07-24 DIAGNOSIS — E7800 Pure hypercholesterolemia, unspecified: Secondary | ICD-10-CM | POA: Diagnosis not present

## 2018-07-31 DIAGNOSIS — E1151 Type 2 diabetes mellitus with diabetic peripheral angiopathy without gangrene: Secondary | ICD-10-CM | POA: Diagnosis not present

## 2018-07-31 DIAGNOSIS — E1140 Type 2 diabetes mellitus with diabetic neuropathy, unspecified: Secondary | ICD-10-CM | POA: Diagnosis not present

## 2018-08-08 DIAGNOSIS — E1122 Type 2 diabetes mellitus with diabetic chronic kidney disease: Secondary | ICD-10-CM | POA: Diagnosis not present

## 2018-08-08 DIAGNOSIS — F419 Anxiety disorder, unspecified: Secondary | ICD-10-CM | POA: Diagnosis not present

## 2018-08-08 DIAGNOSIS — Z299 Encounter for prophylactic measures, unspecified: Secondary | ICD-10-CM | POA: Diagnosis not present

## 2018-08-08 DIAGNOSIS — I509 Heart failure, unspecified: Secondary | ICD-10-CM | POA: Diagnosis not present

## 2018-08-08 DIAGNOSIS — I10 Essential (primary) hypertension: Secondary | ICD-10-CM | POA: Diagnosis not present

## 2018-08-13 DIAGNOSIS — Z299 Encounter for prophylactic measures, unspecified: Secondary | ICD-10-CM | POA: Diagnosis not present

## 2018-08-13 DIAGNOSIS — J449 Chronic obstructive pulmonary disease, unspecified: Secondary | ICD-10-CM | POA: Diagnosis not present

## 2018-08-13 DIAGNOSIS — E1165 Type 2 diabetes mellitus with hyperglycemia: Secondary | ICD-10-CM | POA: Diagnosis not present

## 2018-08-13 DIAGNOSIS — I509 Heart failure, unspecified: Secondary | ICD-10-CM | POA: Diagnosis not present

## 2018-08-13 DIAGNOSIS — E1122 Type 2 diabetes mellitus with diabetic chronic kidney disease: Secondary | ICD-10-CM | POA: Diagnosis not present

## 2018-08-13 DIAGNOSIS — I10 Essential (primary) hypertension: Secondary | ICD-10-CM | POA: Diagnosis not present

## 2018-08-13 DIAGNOSIS — Z6838 Body mass index (BMI) 38.0-38.9, adult: Secondary | ICD-10-CM | POA: Diagnosis not present

## 2018-08-20 ENCOUNTER — Ambulatory Visit (INDEPENDENT_AMBULATORY_CARE_PROVIDER_SITE_OTHER): Payer: Medicare Other | Admitting: *Deleted

## 2018-08-20 ENCOUNTER — Other Ambulatory Visit: Payer: Self-pay

## 2018-08-20 DIAGNOSIS — I4891 Unspecified atrial fibrillation: Secondary | ICD-10-CM | POA: Diagnosis not present

## 2018-08-20 DIAGNOSIS — Z5181 Encounter for therapeutic drug level monitoring: Secondary | ICD-10-CM | POA: Diagnosis not present

## 2018-08-20 LAB — POCT INR: INR: 3.9 — AB (ref 2.0–3.0)

## 2018-08-20 NOTE — Patient Instructions (Signed)
Hold coumadin tonight then decrease dose to 1/2 tablet daily except 1 tablet on Mondays and Thursdays Recheck in 6 weeks

## 2018-09-10 DIAGNOSIS — I272 Pulmonary hypertension, unspecified: Secondary | ICD-10-CM | POA: Diagnosis not present

## 2018-09-10 DIAGNOSIS — N183 Chronic kidney disease, stage 3 (moderate): Secondary | ICD-10-CM | POA: Diagnosis not present

## 2018-09-10 DIAGNOSIS — E1165 Type 2 diabetes mellitus with hyperglycemia: Secondary | ICD-10-CM | POA: Diagnosis not present

## 2018-09-10 DIAGNOSIS — Z299 Encounter for prophylactic measures, unspecified: Secondary | ICD-10-CM | POA: Diagnosis not present

## 2018-09-10 DIAGNOSIS — E1122 Type 2 diabetes mellitus with diabetic chronic kidney disease: Secondary | ICD-10-CM | POA: Diagnosis not present

## 2018-09-10 DIAGNOSIS — Z6839 Body mass index (BMI) 39.0-39.9, adult: Secondary | ICD-10-CM | POA: Diagnosis not present

## 2018-09-10 DIAGNOSIS — I10 Essential (primary) hypertension: Secondary | ICD-10-CM | POA: Diagnosis not present

## 2018-09-10 DIAGNOSIS — J449 Chronic obstructive pulmonary disease, unspecified: Secondary | ICD-10-CM | POA: Diagnosis not present

## 2018-10-02 ENCOUNTER — Ambulatory Visit (INDEPENDENT_AMBULATORY_CARE_PROVIDER_SITE_OTHER): Payer: Medicare Other | Admitting: *Deleted

## 2018-10-02 ENCOUNTER — Other Ambulatory Visit: Payer: Self-pay

## 2018-10-02 DIAGNOSIS — Z5181 Encounter for therapeutic drug level monitoring: Secondary | ICD-10-CM | POA: Diagnosis not present

## 2018-10-02 DIAGNOSIS — I4891 Unspecified atrial fibrillation: Secondary | ICD-10-CM | POA: Diagnosis not present

## 2018-10-02 LAB — POCT INR: INR: 1.8 — AB (ref 2.0–3.0)

## 2018-10-02 NOTE — Patient Instructions (Signed)
Take 1 tablet tonight then increase dose to 1/2 tablet daily except 1 tablet on Tuesdays, Thursdays and Saturdays Recheck in 6 weeks

## 2018-10-09 DIAGNOSIS — E1140 Type 2 diabetes mellitus with diabetic neuropathy, unspecified: Secondary | ICD-10-CM | POA: Diagnosis not present

## 2018-10-09 DIAGNOSIS — L89893 Pressure ulcer of other site, stage 3: Secondary | ICD-10-CM | POA: Diagnosis not present

## 2018-10-31 DIAGNOSIS — E1140 Type 2 diabetes mellitus with diabetic neuropathy, unspecified: Secondary | ICD-10-CM | POA: Diagnosis not present

## 2018-10-31 DIAGNOSIS — L89892 Pressure ulcer of other site, stage 2: Secondary | ICD-10-CM | POA: Diagnosis not present

## 2018-10-31 DIAGNOSIS — E1151 Type 2 diabetes mellitus with diabetic peripheral angiopathy without gangrene: Secondary | ICD-10-CM | POA: Diagnosis not present

## 2018-11-12 ENCOUNTER — Ambulatory Visit (INDEPENDENT_AMBULATORY_CARE_PROVIDER_SITE_OTHER): Payer: Medicare Other | Admitting: *Deleted

## 2018-11-12 ENCOUNTER — Other Ambulatory Visit: Payer: Self-pay

## 2018-11-12 DIAGNOSIS — I4891 Unspecified atrial fibrillation: Secondary | ICD-10-CM

## 2018-11-12 DIAGNOSIS — Z5181 Encounter for therapeutic drug level monitoring: Secondary | ICD-10-CM | POA: Diagnosis not present

## 2018-11-12 LAB — POCT INR: INR: 2.3 (ref 2.0–3.0)

## 2018-11-12 NOTE — Patient Instructions (Signed)
Continue warfarin 1/2 tablet daily except 1 tablet on Tuesdays, Thursdays and Saturdays Recheck in 6 weeks 

## 2018-11-14 DIAGNOSIS — Z961 Presence of intraocular lens: Secondary | ICD-10-CM | POA: Diagnosis not present

## 2018-11-14 DIAGNOSIS — H40022 Open angle with borderline findings, high risk, left eye: Secondary | ICD-10-CM | POA: Diagnosis not present

## 2018-11-14 DIAGNOSIS — H54414A Blindness right eye category 4, normal vision left eye: Secondary | ICD-10-CM | POA: Diagnosis not present

## 2018-11-14 DIAGNOSIS — E119 Type 2 diabetes mellitus without complications: Secondary | ICD-10-CM | POA: Diagnosis not present

## 2018-11-19 DIAGNOSIS — I10 Essential (primary) hypertension: Secondary | ICD-10-CM | POA: Diagnosis not present

## 2018-11-19 DIAGNOSIS — Z23 Encounter for immunization: Secondary | ICD-10-CM | POA: Diagnosis not present

## 2018-11-19 DIAGNOSIS — E1165 Type 2 diabetes mellitus with hyperglycemia: Secondary | ICD-10-CM | POA: Diagnosis not present

## 2018-11-19 DIAGNOSIS — Z6838 Body mass index (BMI) 38.0-38.9, adult: Secondary | ICD-10-CM | POA: Diagnosis not present

## 2018-11-19 DIAGNOSIS — Z299 Encounter for prophylactic measures, unspecified: Secondary | ICD-10-CM | POA: Diagnosis not present

## 2018-11-19 DIAGNOSIS — Z713 Dietary counseling and surveillance: Secondary | ICD-10-CM | POA: Diagnosis not present

## 2018-11-25 ENCOUNTER — Telehealth: Payer: Self-pay | Admitting: Pharmacist

## 2018-11-25 NOTE — Telephone Encounter (Signed)
Called pt to discuss changing from warfarin to [place] due to better safety and efficacy data as well as less frequent monitoring, especially given COVID-19 pandemic. Spoke with pt's caregiver, also named Sigg, who states pt would prefer to stay on warfarin due to cost at this time.

## 2018-12-10 ENCOUNTER — Other Ambulatory Visit: Payer: Self-pay | Admitting: Cardiovascular Disease

## 2018-12-17 DIAGNOSIS — I10 Essential (primary) hypertension: Secondary | ICD-10-CM | POA: Diagnosis not present

## 2018-12-18 DIAGNOSIS — E1140 Type 2 diabetes mellitus with diabetic neuropathy, unspecified: Secondary | ICD-10-CM | POA: Diagnosis not present

## 2018-12-18 DIAGNOSIS — E1151 Type 2 diabetes mellitus with diabetic peripheral angiopathy without gangrene: Secondary | ICD-10-CM | POA: Diagnosis not present

## 2018-12-24 ENCOUNTER — Other Ambulatory Visit: Payer: Self-pay

## 2018-12-24 ENCOUNTER — Ambulatory Visit (INDEPENDENT_AMBULATORY_CARE_PROVIDER_SITE_OTHER): Payer: Medicare Other | Admitting: *Deleted

## 2018-12-24 DIAGNOSIS — I4891 Unspecified atrial fibrillation: Secondary | ICD-10-CM

## 2018-12-24 DIAGNOSIS — Z5181 Encounter for therapeutic drug level monitoring: Secondary | ICD-10-CM | POA: Diagnosis not present

## 2018-12-24 LAB — POCT INR: INR: 1.8 — AB (ref 2.0–3.0)

## 2018-12-24 NOTE — Patient Instructions (Signed)
Take 1 1/2 tablets tonight then resume 1/2 tablet daily except 1 tablet on Tuesdays, Thursdays and Saturdays Recheck in 6 weeks

## 2019-02-04 ENCOUNTER — Other Ambulatory Visit: Payer: Self-pay

## 2019-02-04 ENCOUNTER — Ambulatory Visit (INDEPENDENT_AMBULATORY_CARE_PROVIDER_SITE_OTHER): Payer: Medicare Other | Admitting: *Deleted

## 2019-02-04 DIAGNOSIS — I4891 Unspecified atrial fibrillation: Secondary | ICD-10-CM | POA: Diagnosis not present

## 2019-02-04 DIAGNOSIS — Z5181 Encounter for therapeutic drug level monitoring: Secondary | ICD-10-CM

## 2019-02-04 LAB — POCT INR: INR: 2 (ref 2.0–3.0)

## 2019-02-04 NOTE — Patient Instructions (Signed)
Continue warfarin 1/2 tablet daily except 1 tablet on Tuesdays, Thursdays and Saturdays Recheck in 6 weeks 

## 2019-02-19 DIAGNOSIS — I10 Essential (primary) hypertension: Secondary | ICD-10-CM | POA: Diagnosis not present

## 2019-03-05 DIAGNOSIS — Z299 Encounter for prophylactic measures, unspecified: Secondary | ICD-10-CM | POA: Diagnosis not present

## 2019-03-05 DIAGNOSIS — E1165 Type 2 diabetes mellitus with hyperglycemia: Secondary | ICD-10-CM | POA: Diagnosis not present

## 2019-03-05 DIAGNOSIS — I10 Essential (primary) hypertension: Secondary | ICD-10-CM | POA: Diagnosis not present

## 2019-03-05 DIAGNOSIS — Z6841 Body Mass Index (BMI) 40.0 and over, adult: Secondary | ICD-10-CM | POA: Diagnosis not present

## 2019-03-05 DIAGNOSIS — N183 Chronic kidney disease, stage 3 unspecified: Secondary | ICD-10-CM | POA: Diagnosis not present

## 2019-03-05 DIAGNOSIS — E1122 Type 2 diabetes mellitus with diabetic chronic kidney disease: Secondary | ICD-10-CM | POA: Diagnosis not present

## 2019-03-05 DIAGNOSIS — J449 Chronic obstructive pulmonary disease, unspecified: Secondary | ICD-10-CM | POA: Diagnosis not present

## 2019-03-18 ENCOUNTER — Ambulatory Visit (INDEPENDENT_AMBULATORY_CARE_PROVIDER_SITE_OTHER): Payer: Medicare Other | Admitting: *Deleted

## 2019-03-18 ENCOUNTER — Other Ambulatory Visit: Payer: Self-pay

## 2019-03-18 DIAGNOSIS — I4821 Permanent atrial fibrillation: Secondary | ICD-10-CM

## 2019-03-18 DIAGNOSIS — Z5181 Encounter for therapeutic drug level monitoring: Secondary | ICD-10-CM | POA: Diagnosis not present

## 2019-03-18 LAB — POCT INR: INR: 1.9 — AB (ref 2.0–3.0)

## 2019-03-18 MED ORDER — WARFARIN SODIUM 7.5 MG PO TABS: ORAL_TABLET | ORAL | 6 refills | Status: DC

## 2019-03-18 NOTE — Patient Instructions (Signed)
Increase warfarin to 1 tablet daily except 1/2 tablet on Mondays, Wednesdays and Fridays Recheck in 4 weeks

## 2019-03-19 DIAGNOSIS — E1151 Type 2 diabetes mellitus with diabetic peripheral angiopathy without gangrene: Secondary | ICD-10-CM | POA: Diagnosis not present

## 2019-03-19 DIAGNOSIS — E1140 Type 2 diabetes mellitus with diabetic neuropathy, unspecified: Secondary | ICD-10-CM | POA: Diagnosis not present

## 2019-03-21 DIAGNOSIS — I10 Essential (primary) hypertension: Secondary | ICD-10-CM | POA: Diagnosis not present

## 2019-04-02 DIAGNOSIS — I480 Paroxysmal atrial fibrillation: Secondary | ICD-10-CM | POA: Diagnosis not present

## 2019-04-02 DIAGNOSIS — Z299 Encounter for prophylactic measures, unspecified: Secondary | ICD-10-CM | POA: Diagnosis not present

## 2019-04-02 DIAGNOSIS — Z87891 Personal history of nicotine dependence: Secondary | ICD-10-CM | POA: Diagnosis not present

## 2019-04-02 DIAGNOSIS — I272 Pulmonary hypertension, unspecified: Secondary | ICD-10-CM | POA: Diagnosis not present

## 2019-04-02 DIAGNOSIS — Z6837 Body mass index (BMI) 37.0-37.9, adult: Secondary | ICD-10-CM | POA: Diagnosis not present

## 2019-04-02 DIAGNOSIS — I10 Essential (primary) hypertension: Secondary | ICD-10-CM | POA: Diagnosis not present

## 2019-04-02 DIAGNOSIS — J449 Chronic obstructive pulmonary disease, unspecified: Secondary | ICD-10-CM | POA: Diagnosis not present

## 2019-04-03 ENCOUNTER — Telehealth (INDEPENDENT_AMBULATORY_CARE_PROVIDER_SITE_OTHER): Payer: Medicare Other | Admitting: Cardiovascular Disease

## 2019-04-03 ENCOUNTER — Encounter: Payer: Self-pay | Admitting: Cardiovascular Disease

## 2019-04-03 VITALS — BP 180/67 | HR 52

## 2019-04-03 DIAGNOSIS — I5032 Chronic diastolic (congestive) heart failure: Secondary | ICD-10-CM

## 2019-04-03 DIAGNOSIS — I480 Paroxysmal atrial fibrillation: Secondary | ICD-10-CM

## 2019-04-03 DIAGNOSIS — I10 Essential (primary) hypertension: Secondary | ICD-10-CM

## 2019-04-03 NOTE — Progress Notes (Signed)
Virtual Visit via Telephone Note   This visit type was conducted due to national recommendations for restrictions regarding the COVID-19 Pandemic (e.g. social distancing) in an effort to limit this patient's exposure and mitigate transmission in our community.  Due to her co-morbid illnesses, this patient is at least at moderate risk for complications without adequate follow up.  This format is felt to be most appropriate for this patient at this time.  The patient did not have access to video technology/had technical difficulties with video requiring transitioning to audio format only (telephone).  All issues noted in this document were discussed and addressed.  No physical exam could be performed with this format.  Please refer to the patient's chart for her  consent to telehealth for [facility].   Date:  04/03/2019   ID:  Haley Cooper, DOB 1940-08-07, MRN [id number]  Patient Location: Home Provider Location: Office  PCP:  Glenda Chroman, MD  Cardiologist:  Kate Sable, MD  Electrophysiologist:  None   Evaluation Performed:  Follow-Up Visit  Chief Complaint:  PAF  History of Present Illness:    Haley Cooper is a 79 y.o. female with paroxysmal atrial fibrillation, hypertension, and chronic diastolic heart failure.  Echocardiogram 08/22/15 demonstrated normal left ventricular systolic function, LVEF 123456, moderate LVH, grade 2 diastolicdysfunction with elevated left ventricular filling pressures, severe left atrial and mild right ventricular and mild right atrial dilatation, moderate tricuspid regurgitation with moderate to severely elevated pulmonary pressures, 61 mmHg.  She has a history of chronic hypoxic respiratory failure and uses 2 L of oxygen throughout the day. She also has chronic bilateral leg edema.  She resides at [facility] assisted living.  Her daughter passed away at age 29 from [place] in 2020. She had four children.  The patient denies chest pain,  palpitations, shortness of breath.  She gets nervous when she sees her blood pressure get high like it is today.    Past Medical History:  Diagnosis Date  . Anemia   . Arthritis    HANDS  . CHF (congestive heart failure) ([place])   . Chronic kidney disease 04/2013   . COPD (chronic obstructive pulmonary disease) ([place])   . DM2 (diabetes mellitus, type 2) ([place])   . HTN (hypertension)   . Paroxysmal atrial fibrillation (HCC)   . Shortness of breath    Past Surgical History:  Procedure Laterality Date  . VEIN SURGERY       Current Meds  Medication Sig  . acetaminophen (TYLENOL) 500 MG tablet Take 500 mg by mouth as needed.  Marland Kitchen atorvastatin (LIPITOR) 20 MG tablet Take 20 mg by mouth at bedtime.   . carvedilol (COREG) 3.125 MG tablet Take 3.125 mg by mouth 2 (two) times daily.  . furosemide (LASIX) 40 MG tablet Take 1 tablet (40 mg total) by mouth 2 (two) times daily.  Marland Kitchen gabapentin (NEURONTIN) 300 MG capsule Take 1 capsule (300 mg total) by mouth 3 (three) times daily.  . hydrALAZINE (APRESOLINE) 50 MG tablet Take 1 tablet by mouth 3 (three) times daily.  . insulin glargine (LANTUS) 100 UNIT/ML injection Inject 0.12 mLs (12 Units total) into the skin every morning. (Patient taking differently: Inject 12 Units into the skin daily. )  . insulin lispro (HUMALOG KWIKPEN) 100 UNIT/ML KiwkPen Inject 6 Units into the skin daily before supper.  . iron polysaccharides (NIFEREX) 150 MG capsule Take 150 mg by mouth daily.  Marland Kitchen levothyroxine (SYNTHROID) 125 MCG tablet Take 125 mcg  by mouth daily.  Marland Kitchen lisinopril (PRINIVIL,ZESTRIL) 20 MG tablet Take 1 tablet (20 mg total) by mouth daily. (Patient taking differently: Take 20 mg by mouth 2 (two) times daily. )  . OXYGEN Inhale into the lungs at bedtime. 2 L/min  . warfarin (COUMADIN) 7.5 MG tablet Increase warfarin to 1 tablet daily except 1/2 tablet on Mondays, Wednesdays and Fridays or as directed (Patient taking differently: Increase warfarin to 1 tablet  daily except 1/2 tablet on Mondays, Wednesdays and Fridays or as directed MANAGED BY DR. Woody Seller)     Allergies:   Patient has no known allergies.   Social History   Tobacco Use  . Smoking status: Former Smoker    Packs/day: 1.00    Years: 29.00    Pack years: 29.00    Types: Cigarettes    Start date: 07/19/1955    Quit date: 02/21/1984    Years since quitting: 35.1  . Smokeless tobacco: Never Used  Substance Use Topics  . Alcohol use: No    Alcohol/week: 0.0 standard drinks  . Drug use: No     Family Hx: The patient's family history includes Hypertension in her father and mother.  ROS:   Please see the history of present illness.     All other systems reviewed and are negative.   Prior CV studies:   The following studies were reviewed today:  NA  Labs/Other Tests and Data Reviewed:    EKG:  No ECG reviewed.  Recent Labs: No results found for requested labs within last 8760 hours.   Recent Lipid Panel Lab Results  Component Value Date/Time   CHOL 129 08/21/2015 05:36 AM   TRIG 99 08/21/2015 05:36 AM   HDL 35 (L) 08/21/2015 05:36 AM   CHOLHDL 3.7 08/21/2015 05:36 AM   LDLCALC 74 08/21/2015 05:36 AM    Wt Readings from Last 3 Encounters:  04/02/18 203 lb 12.8 oz (92.4 kg)  03/20/17 189 lb (85.7 kg)  03/14/16 199 lb (90.3 kg)     Objective:    Vital Signs:  BP (!) 180/67 Comment: from yesterday  Pulse (!) 52    VITAL SIGNS:  reviewed  ASSESSMENT & PLAN:    1. Paroxysmal atrial fibrillation: Symptomatically stable. Anticoagulated with warfarin.  2. Chronic diastolic heart failure: She has grade 2 diastolic dysfunction.  She takes Lasix 40 mg twice daily.  3.  Hypertension: Blood pressure is elevated.  I will increase hydralazine to 75 mg three times daily.    COVID-19 Education: The signs and symptoms of COVID-19 were discussed with the patient and how to seek care for testing (follow up with PCP or arrange E-visit).  The importance of social  distancing was discussed today.  Time:   Today, I have spent 15 minutes with the patient with telehealth technology discussing the above problems.     Medication Adjustments/Labs and Tests Ordered: Current medicines are reviewed at length with the patient today.  Concerns regarding medicines are outlined above.   Tests Ordered: No orders of the defined types were placed in this encounter.   Medication Changes: No orders of the defined types were placed in this encounter.   Follow Up:  Virtual Visit  in 6 month(s)  Signed, Kate Sable, MD  04/03/2019 10:09 AM    [place]

## 2019-04-04 ENCOUNTER — Other Ambulatory Visit: Payer: Self-pay | Admitting: *Deleted

## 2019-04-04 MED ORDER — HYDRALAZINE HCL 50 MG PO TABS: 75.0000 mg | ORAL_TABLET | Freq: Three times a day (TID) | ORAL | 3 refills | Status: DC

## 2019-04-04 NOTE — Patient Instructions (Addendum)
Medication Instructions:   Increase Hydralazine to 75mg  three times per day - new prescription sent to [place].   Continue all other medications.    Labwork: none  Testing/Procedures: none  Follow-Up: 6 months   Any Other Special Instructions Will Be Listed Below (If Applicable).  If you need a refill on your cardiac medications before your next appointment, please call your pharmacy.

## 2019-04-04 NOTE — Addendum Note (Signed)
Addended by: Laurine Blazer on: 04/04/2019 03:45 PM   Modules accepted: Orders

## 2019-04-16 ENCOUNTER — Ambulatory Visit (INDEPENDENT_AMBULATORY_CARE_PROVIDER_SITE_OTHER): Payer: Medicare Other | Admitting: *Deleted

## 2019-04-16 ENCOUNTER — Other Ambulatory Visit: Payer: Self-pay

## 2019-04-16 DIAGNOSIS — I4821 Permanent atrial fibrillation: Secondary | ICD-10-CM | POA: Diagnosis not present

## 2019-04-16 DIAGNOSIS — Z5181 Encounter for therapeutic drug level monitoring: Secondary | ICD-10-CM | POA: Diagnosis not present

## 2019-04-16 LAB — POCT INR: INR: 3 (ref 2.0–3.0)

## 2019-04-16 NOTE — Patient Instructions (Signed)
Continue warfarin 1 tablet daily except 1/2 tablet on Mondays, Wednesdays and Fridays Recheck in 4 weeks 

## 2019-04-20 DIAGNOSIS — I10 Essential (primary) hypertension: Secondary | ICD-10-CM | POA: Diagnosis not present

## 2019-05-09 ENCOUNTER — Telehealth: Payer: Self-pay | Admitting: Cardiovascular Disease

## 2019-05-09 NOTE — Telephone Encounter (Signed)
[facility] called in regards to patient's BP. They are having issues with the BP cuff.  Wanted to know if she needs a new one. Please call [phone].

## 2019-05-09 NOTE — Telephone Encounter (Signed)
Spoke with Haley Cooper and she reports BP cuff given to patient by our office 2 months ago and is now reading error. Peggy reports that the BP monitor has been working fine before today. Advised to try changing the batteries to see if the error messages resolve. Advise if this doesn't help, to contact our office for a new BP monitor. Verbalized understanding.

## 2019-05-14 ENCOUNTER — Other Ambulatory Visit: Payer: Self-pay

## 2019-05-14 ENCOUNTER — Ambulatory Visit (INDEPENDENT_AMBULATORY_CARE_PROVIDER_SITE_OTHER): Payer: Medicare Other | Admitting: *Deleted

## 2019-05-14 DIAGNOSIS — I4821 Permanent atrial fibrillation: Secondary | ICD-10-CM | POA: Diagnosis not present

## 2019-05-14 DIAGNOSIS — Z5181 Encounter for therapeutic drug level monitoring: Secondary | ICD-10-CM

## 2019-05-14 LAB — POCT INR: INR: 2.4 (ref 2.0–3.0)

## 2019-05-14 NOTE — Patient Instructions (Signed)
Continue warfarin 1 tablet daily except 1/2 tablet on Mondays, Wednesdays and Fridays Recheck in 6 weeks.  

## 2019-05-20 DIAGNOSIS — I10 Essential (primary) hypertension: Secondary | ICD-10-CM | POA: Diagnosis not present

## 2019-05-28 DIAGNOSIS — E1140 Type 2 diabetes mellitus with diabetic neuropathy, unspecified: Secondary | ICD-10-CM | POA: Diagnosis not present

## 2019-05-28 DIAGNOSIS — E1151 Type 2 diabetes mellitus with diabetic peripheral angiopathy without gangrene: Secondary | ICD-10-CM | POA: Diagnosis not present

## 2019-06-11 DIAGNOSIS — Z299 Encounter for prophylactic measures, unspecified: Secondary | ICD-10-CM | POA: Diagnosis not present

## 2019-06-11 DIAGNOSIS — I10 Essential (primary) hypertension: Secondary | ICD-10-CM | POA: Diagnosis not present

## 2019-06-11 DIAGNOSIS — E1122 Type 2 diabetes mellitus with diabetic chronic kidney disease: Secondary | ICD-10-CM | POA: Diagnosis not present

## 2019-06-11 DIAGNOSIS — Z6841 Body Mass Index (BMI) 40.0 and over, adult: Secondary | ICD-10-CM | POA: Diagnosis not present

## 2019-06-11 DIAGNOSIS — N183 Chronic kidney disease, stage 3 unspecified: Secondary | ICD-10-CM | POA: Diagnosis not present

## 2019-06-11 DIAGNOSIS — E1165 Type 2 diabetes mellitus with hyperglycemia: Secondary | ICD-10-CM | POA: Diagnosis not present

## 2019-06-20 DIAGNOSIS — I10 Essential (primary) hypertension: Secondary | ICD-10-CM | POA: Diagnosis not present

## 2019-06-25 ENCOUNTER — Other Ambulatory Visit: Payer: Self-pay

## 2019-06-25 ENCOUNTER — Ambulatory Visit (INDEPENDENT_AMBULATORY_CARE_PROVIDER_SITE_OTHER): Payer: Medicare Other | Admitting: *Deleted

## 2019-06-25 DIAGNOSIS — I4821 Permanent atrial fibrillation: Secondary | ICD-10-CM | POA: Diagnosis not present

## 2019-06-25 DIAGNOSIS — Z5181 Encounter for therapeutic drug level monitoring: Secondary | ICD-10-CM | POA: Diagnosis not present

## 2019-06-25 LAB — POCT INR: INR: 3 (ref 2.0–3.0)

## 2019-06-25 NOTE — Patient Instructions (Signed)
Continue warfarin 1 tablet daily except 1/2 tablet on Mondays, Wednesdays and Fridays Recheck in 6 weeks.  

## 2019-06-26 DIAGNOSIS — Z789 Other specified health status: Secondary | ICD-10-CM | POA: Diagnosis not present

## 2019-06-26 DIAGNOSIS — I509 Heart failure, unspecified: Secondary | ICD-10-CM | POA: Diagnosis not present

## 2019-06-26 DIAGNOSIS — Z299 Encounter for prophylactic measures, unspecified: Secondary | ICD-10-CM | POA: Diagnosis not present

## 2019-06-26 DIAGNOSIS — E1165 Type 2 diabetes mellitus with hyperglycemia: Secondary | ICD-10-CM | POA: Diagnosis not present

## 2019-06-26 DIAGNOSIS — L0390 Cellulitis, unspecified: Secondary | ICD-10-CM | POA: Diagnosis not present

## 2019-06-26 DIAGNOSIS — I10 Essential (primary) hypertension: Secondary | ICD-10-CM | POA: Diagnosis not present

## 2019-06-26 DIAGNOSIS — Z6841 Body Mass Index (BMI) 40.0 and over, adult: Secondary | ICD-10-CM | POA: Diagnosis not present

## 2019-06-26 DIAGNOSIS — E1122 Type 2 diabetes mellitus with diabetic chronic kidney disease: Secondary | ICD-10-CM | POA: Diagnosis not present

## 2019-07-03 ENCOUNTER — Other Ambulatory Visit: Payer: Self-pay | Admitting: Cardiovascular Disease

## 2019-07-03 DIAGNOSIS — Z299 Encounter for prophylactic measures, unspecified: Secondary | ICD-10-CM | POA: Diagnosis not present

## 2019-07-03 DIAGNOSIS — I509 Heart failure, unspecified: Secondary | ICD-10-CM | POA: Diagnosis not present

## 2019-07-03 DIAGNOSIS — E1165 Type 2 diabetes mellitus with hyperglycemia: Secondary | ICD-10-CM | POA: Diagnosis not present

## 2019-07-03 DIAGNOSIS — I10 Essential (primary) hypertension: Secondary | ICD-10-CM | POA: Diagnosis not present

## 2019-07-03 DIAGNOSIS — E1122 Type 2 diabetes mellitus with diabetic chronic kidney disease: Secondary | ICD-10-CM | POA: Diagnosis not present

## 2019-07-15 DIAGNOSIS — L97222 Non-pressure chronic ulcer of left calf with fat layer exposed: Secondary | ICD-10-CM | POA: Diagnosis not present

## 2019-07-15 DIAGNOSIS — S81802A Unspecified open wound, left lower leg, initial encounter: Secondary | ICD-10-CM | POA: Diagnosis not present

## 2019-07-15 DIAGNOSIS — I83022 Varicose veins of left lower extremity with ulcer of calf: Secondary | ICD-10-CM | POA: Diagnosis not present

## 2019-07-20 DIAGNOSIS — I10 Essential (primary) hypertension: Secondary | ICD-10-CM | POA: Diagnosis not present

## 2019-08-06 DIAGNOSIS — Z1331 Encounter for screening for depression: Secondary | ICD-10-CM | POA: Diagnosis not present

## 2019-08-06 DIAGNOSIS — E7800 Pure hypercholesterolemia, unspecified: Secondary | ICD-10-CM | POA: Diagnosis not present

## 2019-08-06 DIAGNOSIS — Z1211 Encounter for screening for malignant neoplasm of colon: Secondary | ICD-10-CM | POA: Diagnosis not present

## 2019-08-06 DIAGNOSIS — Z6830 Body mass index (BMI) 30.0-30.9, adult: Secondary | ICD-10-CM | POA: Diagnosis not present

## 2019-08-06 DIAGNOSIS — Z7189 Other specified counseling: Secondary | ICD-10-CM | POA: Diagnosis not present

## 2019-08-06 DIAGNOSIS — I10 Essential (primary) hypertension: Secondary | ICD-10-CM | POA: Diagnosis not present

## 2019-08-06 DIAGNOSIS — Z1339 Encounter for screening examination for other mental health and behavioral disorders: Secondary | ICD-10-CM | POA: Diagnosis not present

## 2019-08-06 DIAGNOSIS — Z79899 Other long term (current) drug therapy: Secondary | ICD-10-CM | POA: Diagnosis not present

## 2019-08-06 DIAGNOSIS — Z299 Encounter for prophylactic measures, unspecified: Secondary | ICD-10-CM | POA: Diagnosis not present

## 2019-08-06 DIAGNOSIS — I509 Heart failure, unspecified: Secondary | ICD-10-CM | POA: Diagnosis not present

## 2019-08-06 DIAGNOSIS — Z0000 Encounter for general adult medical examination without abnormal findings: Secondary | ICD-10-CM | POA: Diagnosis not present

## 2019-08-06 DIAGNOSIS — E1165 Type 2 diabetes mellitus with hyperglycemia: Secondary | ICD-10-CM | POA: Diagnosis not present

## 2019-08-06 DIAGNOSIS — E039 Hypothyroidism, unspecified: Secondary | ICD-10-CM | POA: Diagnosis not present

## 2019-08-06 DIAGNOSIS — R5383 Other fatigue: Secondary | ICD-10-CM | POA: Diagnosis not present

## 2019-08-11 ENCOUNTER — Telehealth: Payer: Self-pay | Admitting: Cardiovascular Disease

## 2019-08-11 MED ORDER — WARFARIN SODIUM 7.5 MG PO TABS: ORAL_TABLET | ORAL | 6 refills | Status: DC

## 2019-08-11 NOTE — Telephone Encounter (Signed)
     1. Which medications need to be refilled? (please list name of each medication and dose if known)  Warfarin 7.5   2. Which pharmacy/location (including street and city if local pharmacy) is medication to be sent to?  [facility]   3. Do they need a 30 day or 90 day supply?

## 2019-08-19 DIAGNOSIS — J449 Chronic obstructive pulmonary disease, unspecified: Secondary | ICD-10-CM | POA: Diagnosis not present

## 2019-08-19 DIAGNOSIS — Z7901 Long term (current) use of anticoagulants: Secondary | ICD-10-CM | POA: Diagnosis not present

## 2019-08-19 DIAGNOSIS — I509 Heart failure, unspecified: Secondary | ICD-10-CM | POA: Diagnosis not present

## 2019-08-19 DIAGNOSIS — E1159 Type 2 diabetes mellitus with other circulatory complications: Secondary | ICD-10-CM | POA: Diagnosis not present

## 2019-08-19 DIAGNOSIS — I739 Peripheral vascular disease, unspecified: Secondary | ICD-10-CM | POA: Diagnosis not present

## 2019-08-19 DIAGNOSIS — I83022 Varicose veins of left lower extremity with ulcer of calf: Secondary | ICD-10-CM | POA: Diagnosis not present

## 2019-08-19 DIAGNOSIS — N183 Chronic kidney disease, stage 3 unspecified: Secondary | ICD-10-CM | POA: Diagnosis not present

## 2019-08-19 DIAGNOSIS — E039 Hypothyroidism, unspecified: Secondary | ICD-10-CM | POA: Diagnosis not present

## 2019-08-19 DIAGNOSIS — I130 Hypertensive heart and chronic kidney disease with heart failure and stage 1 through stage 4 chronic kidney disease, or unspecified chronic kidney disease: Secondary | ICD-10-CM | POA: Diagnosis not present

## 2019-08-19 DIAGNOSIS — Z87891 Personal history of nicotine dependence: Secondary | ICD-10-CM | POA: Diagnosis not present

## 2019-08-19 DIAGNOSIS — F419 Anxiety disorder, unspecified: Secondary | ICD-10-CM | POA: Diagnosis not present

## 2019-08-19 DIAGNOSIS — Z794 Long term (current) use of insulin: Secondary | ICD-10-CM | POA: Diagnosis not present

## 2019-08-19 DIAGNOSIS — L97222 Non-pressure chronic ulcer of left calf with fat layer exposed: Secondary | ICD-10-CM | POA: Diagnosis not present

## 2019-08-19 DIAGNOSIS — E1122 Type 2 diabetes mellitus with diabetic chronic kidney disease: Secondary | ICD-10-CM | POA: Diagnosis not present

## 2019-08-19 DIAGNOSIS — E7800 Pure hypercholesterolemia, unspecified: Secondary | ICD-10-CM | POA: Diagnosis not present

## 2019-08-19 DIAGNOSIS — I4891 Unspecified atrial fibrillation: Secondary | ICD-10-CM | POA: Diagnosis not present

## 2019-08-19 DIAGNOSIS — Z79899 Other long term (current) drug therapy: Secondary | ICD-10-CM | POA: Diagnosis not present

## 2019-08-19 DIAGNOSIS — I872 Venous insufficiency (chronic) (peripheral): Secondary | ICD-10-CM | POA: Diagnosis not present

## 2019-08-19 DIAGNOSIS — K219 Gastro-esophageal reflux disease without esophagitis: Secondary | ICD-10-CM | POA: Diagnosis not present

## 2019-08-20 DIAGNOSIS — L97802 Non-pressure chronic ulcer of other part of unspecified lower leg with fat layer exposed: Secondary | ICD-10-CM | POA: Diagnosis not present

## 2019-08-20 DIAGNOSIS — E1122 Type 2 diabetes mellitus with diabetic chronic kidney disease: Secondary | ICD-10-CM | POA: Diagnosis not present

## 2019-08-20 DIAGNOSIS — Z4800 Encounter for change or removal of nonsurgical wound dressing: Secondary | ICD-10-CM | POA: Diagnosis not present

## 2019-08-20 DIAGNOSIS — D631 Anemia in chronic kidney disease: Secondary | ICD-10-CM | POA: Diagnosis not present

## 2019-08-20 DIAGNOSIS — E1151 Type 2 diabetes mellitus with diabetic peripheral angiopathy without gangrene: Secondary | ICD-10-CM | POA: Diagnosis not present

## 2019-08-20 DIAGNOSIS — Z6841 Body Mass Index (BMI) 40.0 and over, adult: Secondary | ICD-10-CM | POA: Diagnosis not present

## 2019-08-20 DIAGNOSIS — Z7901 Long term (current) use of anticoagulants: Secondary | ICD-10-CM | POA: Diagnosis not present

## 2019-08-20 DIAGNOSIS — Z87891 Personal history of nicotine dependence: Secondary | ICD-10-CM | POA: Diagnosis not present

## 2019-08-20 DIAGNOSIS — J449 Chronic obstructive pulmonary disease, unspecified: Secondary | ICD-10-CM | POA: Diagnosis not present

## 2019-08-20 DIAGNOSIS — I509 Heart failure, unspecified: Secondary | ICD-10-CM | POA: Diagnosis not present

## 2019-08-20 DIAGNOSIS — Z794 Long term (current) use of insulin: Secondary | ICD-10-CM | POA: Diagnosis not present

## 2019-08-20 DIAGNOSIS — E039 Hypothyroidism, unspecified: Secondary | ICD-10-CM | POA: Diagnosis not present

## 2019-08-20 DIAGNOSIS — I130 Hypertensive heart and chronic kidney disease with heart failure and stage 1 through stage 4 chronic kidney disease, or unspecified chronic kidney disease: Secondary | ICD-10-CM | POA: Diagnosis not present

## 2019-08-20 DIAGNOSIS — E7800 Pure hypercholesterolemia, unspecified: Secondary | ICD-10-CM | POA: Diagnosis not present

## 2019-08-20 DIAGNOSIS — I872 Venous insufficiency (chronic) (peripheral): Secondary | ICD-10-CM | POA: Diagnosis not present

## 2019-08-20 DIAGNOSIS — E669 Obesity, unspecified: Secondary | ICD-10-CM | POA: Diagnosis not present

## 2019-08-20 DIAGNOSIS — I4891 Unspecified atrial fibrillation: Secondary | ICD-10-CM | POA: Diagnosis not present

## 2019-08-20 DIAGNOSIS — I272 Pulmonary hypertension, unspecified: Secondary | ICD-10-CM | POA: Diagnosis not present

## 2019-08-20 DIAGNOSIS — N183 Chronic kidney disease, stage 3 unspecified: Secondary | ICD-10-CM | POA: Diagnosis not present

## 2019-08-20 DIAGNOSIS — I10 Essential (primary) hypertension: Secondary | ICD-10-CM | POA: Diagnosis not present

## 2019-08-20 DIAGNOSIS — M1990 Unspecified osteoarthritis, unspecified site: Secondary | ICD-10-CM | POA: Diagnosis not present

## 2019-08-20 DIAGNOSIS — L97222 Non-pressure chronic ulcer of left calf with fat layer exposed: Secondary | ICD-10-CM | POA: Diagnosis not present

## 2019-08-20 DIAGNOSIS — Z9981 Dependence on supplemental oxygen: Secondary | ICD-10-CM | POA: Diagnosis not present

## 2019-08-22 DIAGNOSIS — I872 Venous insufficiency (chronic) (peripheral): Secondary | ICD-10-CM | POA: Diagnosis not present

## 2019-08-22 DIAGNOSIS — L97802 Non-pressure chronic ulcer of other part of unspecified lower leg with fat layer exposed: Secondary | ICD-10-CM | POA: Diagnosis not present

## 2019-08-22 DIAGNOSIS — L97222 Non-pressure chronic ulcer of left calf with fat layer exposed: Secondary | ICD-10-CM | POA: Diagnosis not present

## 2019-08-22 DIAGNOSIS — I130 Hypertensive heart and chronic kidney disease with heart failure and stage 1 through stage 4 chronic kidney disease, or unspecified chronic kidney disease: Secondary | ICD-10-CM | POA: Diagnosis not present

## 2019-08-22 DIAGNOSIS — Z4800 Encounter for change or removal of nonsurgical wound dressing: Secondary | ICD-10-CM | POA: Diagnosis not present

## 2019-08-22 DIAGNOSIS — E1151 Type 2 diabetes mellitus with diabetic peripheral angiopathy without gangrene: Secondary | ICD-10-CM | POA: Diagnosis not present

## 2019-08-26 DIAGNOSIS — Z4800 Encounter for change or removal of nonsurgical wound dressing: Secondary | ICD-10-CM | POA: Diagnosis not present

## 2019-08-26 DIAGNOSIS — E1151 Type 2 diabetes mellitus with diabetic peripheral angiopathy without gangrene: Secondary | ICD-10-CM | POA: Diagnosis not present

## 2019-08-26 DIAGNOSIS — I130 Hypertensive heart and chronic kidney disease with heart failure and stage 1 through stage 4 chronic kidney disease, or unspecified chronic kidney disease: Secondary | ICD-10-CM | POA: Diagnosis not present

## 2019-08-26 DIAGNOSIS — L97222 Non-pressure chronic ulcer of left calf with fat layer exposed: Secondary | ICD-10-CM | POA: Diagnosis not present

## 2019-08-26 DIAGNOSIS — I872 Venous insufficiency (chronic) (peripheral): Secondary | ICD-10-CM | POA: Diagnosis not present

## 2019-08-26 DIAGNOSIS — L97802 Non-pressure chronic ulcer of other part of unspecified lower leg with fat layer exposed: Secondary | ICD-10-CM | POA: Diagnosis not present

## 2019-08-27 DIAGNOSIS — I872 Venous insufficiency (chronic) (peripheral): Secondary | ICD-10-CM | POA: Diagnosis not present

## 2019-08-27 DIAGNOSIS — L97222 Non-pressure chronic ulcer of left calf with fat layer exposed: Secondary | ICD-10-CM | POA: Diagnosis not present

## 2019-08-27 DIAGNOSIS — E1151 Type 2 diabetes mellitus with diabetic peripheral angiopathy without gangrene: Secondary | ICD-10-CM | POA: Diagnosis not present

## 2019-08-27 DIAGNOSIS — I130 Hypertensive heart and chronic kidney disease with heart failure and stage 1 through stage 4 chronic kidney disease, or unspecified chronic kidney disease: Secondary | ICD-10-CM | POA: Diagnosis not present

## 2019-08-27 DIAGNOSIS — Z4800 Encounter for change or removal of nonsurgical wound dressing: Secondary | ICD-10-CM | POA: Diagnosis not present

## 2019-08-27 DIAGNOSIS — L97802 Non-pressure chronic ulcer of other part of unspecified lower leg with fat layer exposed: Secondary | ICD-10-CM | POA: Diagnosis not present

## 2019-08-29 DIAGNOSIS — L97222 Non-pressure chronic ulcer of left calf with fat layer exposed: Secondary | ICD-10-CM | POA: Diagnosis not present

## 2019-08-29 DIAGNOSIS — Z4800 Encounter for change or removal of nonsurgical wound dressing: Secondary | ICD-10-CM | POA: Diagnosis not present

## 2019-08-29 DIAGNOSIS — L97802 Non-pressure chronic ulcer of other part of unspecified lower leg with fat layer exposed: Secondary | ICD-10-CM | POA: Diagnosis not present

## 2019-08-29 DIAGNOSIS — I872 Venous insufficiency (chronic) (peripheral): Secondary | ICD-10-CM | POA: Diagnosis not present

## 2019-08-29 DIAGNOSIS — E1151 Type 2 diabetes mellitus with diabetic peripheral angiopathy without gangrene: Secondary | ICD-10-CM | POA: Diagnosis not present

## 2019-08-29 DIAGNOSIS — I130 Hypertensive heart and chronic kidney disease with heart failure and stage 1 through stage 4 chronic kidney disease, or unspecified chronic kidney disease: Secondary | ICD-10-CM | POA: Diagnosis not present

## 2019-09-01 DIAGNOSIS — Z4800 Encounter for change or removal of nonsurgical wound dressing: Secondary | ICD-10-CM | POA: Diagnosis not present

## 2019-09-01 DIAGNOSIS — L97802 Non-pressure chronic ulcer of other part of unspecified lower leg with fat layer exposed: Secondary | ICD-10-CM | POA: Diagnosis not present

## 2019-09-01 DIAGNOSIS — E1151 Type 2 diabetes mellitus with diabetic peripheral angiopathy without gangrene: Secondary | ICD-10-CM | POA: Diagnosis not present

## 2019-09-01 DIAGNOSIS — L97222 Non-pressure chronic ulcer of left calf with fat layer exposed: Secondary | ICD-10-CM | POA: Diagnosis not present

## 2019-09-01 DIAGNOSIS — I872 Venous insufficiency (chronic) (peripheral): Secondary | ICD-10-CM | POA: Diagnosis not present

## 2019-09-01 DIAGNOSIS — I130 Hypertensive heart and chronic kidney disease with heart failure and stage 1 through stage 4 chronic kidney disease, or unspecified chronic kidney disease: Secondary | ICD-10-CM | POA: Diagnosis not present

## 2019-09-02 DIAGNOSIS — E2839 Other primary ovarian failure: Secondary | ICD-10-CM | POA: Diagnosis not present

## 2019-09-03 DIAGNOSIS — E1151 Type 2 diabetes mellitus with diabetic peripheral angiopathy without gangrene: Secondary | ICD-10-CM | POA: Diagnosis not present

## 2019-09-03 DIAGNOSIS — I872 Venous insufficiency (chronic) (peripheral): Secondary | ICD-10-CM | POA: Diagnosis not present

## 2019-09-03 DIAGNOSIS — I130 Hypertensive heart and chronic kidney disease with heart failure and stage 1 through stage 4 chronic kidney disease, or unspecified chronic kidney disease: Secondary | ICD-10-CM | POA: Diagnosis not present

## 2019-09-03 DIAGNOSIS — Z4800 Encounter for change or removal of nonsurgical wound dressing: Secondary | ICD-10-CM | POA: Diagnosis not present

## 2019-09-03 DIAGNOSIS — L97222 Non-pressure chronic ulcer of left calf with fat layer exposed: Secondary | ICD-10-CM | POA: Diagnosis not present

## 2019-09-03 DIAGNOSIS — L97802 Non-pressure chronic ulcer of other part of unspecified lower leg with fat layer exposed: Secondary | ICD-10-CM | POA: Diagnosis not present

## 2019-09-05 DIAGNOSIS — L97802 Non-pressure chronic ulcer of other part of unspecified lower leg with fat layer exposed: Secondary | ICD-10-CM | POA: Diagnosis not present

## 2019-09-05 DIAGNOSIS — I872 Venous insufficiency (chronic) (peripheral): Secondary | ICD-10-CM | POA: Diagnosis not present

## 2019-09-05 DIAGNOSIS — I130 Hypertensive heart and chronic kidney disease with heart failure and stage 1 through stage 4 chronic kidney disease, or unspecified chronic kidney disease: Secondary | ICD-10-CM | POA: Diagnosis not present

## 2019-09-05 DIAGNOSIS — Z4800 Encounter for change or removal of nonsurgical wound dressing: Secondary | ICD-10-CM | POA: Diagnosis not present

## 2019-09-05 DIAGNOSIS — E1151 Type 2 diabetes mellitus with diabetic peripheral angiopathy without gangrene: Secondary | ICD-10-CM | POA: Diagnosis not present

## 2019-09-05 DIAGNOSIS — L97222 Non-pressure chronic ulcer of left calf with fat layer exposed: Secondary | ICD-10-CM | POA: Diagnosis not present

## 2019-09-08 DIAGNOSIS — I872 Venous insufficiency (chronic) (peripheral): Secondary | ICD-10-CM | POA: Diagnosis not present

## 2019-09-08 DIAGNOSIS — Z4800 Encounter for change or removal of nonsurgical wound dressing: Secondary | ICD-10-CM | POA: Diagnosis not present

## 2019-09-08 DIAGNOSIS — I130 Hypertensive heart and chronic kidney disease with heart failure and stage 1 through stage 4 chronic kidney disease, or unspecified chronic kidney disease: Secondary | ICD-10-CM | POA: Diagnosis not present

## 2019-09-08 DIAGNOSIS — E1151 Type 2 diabetes mellitus with diabetic peripheral angiopathy without gangrene: Secondary | ICD-10-CM | POA: Diagnosis not present

## 2019-09-08 DIAGNOSIS — L97802 Non-pressure chronic ulcer of other part of unspecified lower leg with fat layer exposed: Secondary | ICD-10-CM | POA: Diagnosis not present

## 2019-09-08 DIAGNOSIS — L97222 Non-pressure chronic ulcer of left calf with fat layer exposed: Secondary | ICD-10-CM | POA: Diagnosis not present

## 2019-09-10 DIAGNOSIS — I130 Hypertensive heart and chronic kidney disease with heart failure and stage 1 through stage 4 chronic kidney disease, or unspecified chronic kidney disease: Secondary | ICD-10-CM | POA: Diagnosis not present

## 2019-09-10 DIAGNOSIS — E1151 Type 2 diabetes mellitus with diabetic peripheral angiopathy without gangrene: Secondary | ICD-10-CM | POA: Diagnosis not present

## 2019-09-10 DIAGNOSIS — L97222 Non-pressure chronic ulcer of left calf with fat layer exposed: Secondary | ICD-10-CM | POA: Diagnosis not present

## 2019-09-10 DIAGNOSIS — Z4800 Encounter for change or removal of nonsurgical wound dressing: Secondary | ICD-10-CM | POA: Diagnosis not present

## 2019-09-10 DIAGNOSIS — I872 Venous insufficiency (chronic) (peripheral): Secondary | ICD-10-CM | POA: Diagnosis not present

## 2019-09-10 DIAGNOSIS — L97802 Non-pressure chronic ulcer of other part of unspecified lower leg with fat layer exposed: Secondary | ICD-10-CM | POA: Diagnosis not present

## 2019-09-12 ENCOUNTER — Telehealth: Payer: Self-pay

## 2019-09-12 DIAGNOSIS — E1151 Type 2 diabetes mellitus with diabetic peripheral angiopathy without gangrene: Secondary | ICD-10-CM | POA: Diagnosis not present

## 2019-09-12 DIAGNOSIS — I872 Venous insufficiency (chronic) (peripheral): Secondary | ICD-10-CM | POA: Diagnosis not present

## 2019-09-12 DIAGNOSIS — I130 Hypertensive heart and chronic kidney disease with heart failure and stage 1 through stage 4 chronic kidney disease, or unspecified chronic kidney disease: Secondary | ICD-10-CM | POA: Diagnosis not present

## 2019-09-12 DIAGNOSIS — Z4800 Encounter for change or removal of nonsurgical wound dressing: Secondary | ICD-10-CM | POA: Diagnosis not present

## 2019-09-12 DIAGNOSIS — E538 Deficiency of other specified B group vitamins: Secondary | ICD-10-CM | POA: Diagnosis not present

## 2019-09-12 DIAGNOSIS — E1165 Type 2 diabetes mellitus with hyperglycemia: Secondary | ICD-10-CM | POA: Diagnosis not present

## 2019-09-12 DIAGNOSIS — L97802 Non-pressure chronic ulcer of other part of unspecified lower leg with fat layer exposed: Secondary | ICD-10-CM | POA: Diagnosis not present

## 2019-09-12 DIAGNOSIS — Z299 Encounter for prophylactic measures, unspecified: Secondary | ICD-10-CM | POA: Diagnosis not present

## 2019-09-12 DIAGNOSIS — J449 Chronic obstructive pulmonary disease, unspecified: Secondary | ICD-10-CM | POA: Diagnosis not present

## 2019-09-12 DIAGNOSIS — I509 Heart failure, unspecified: Secondary | ICD-10-CM | POA: Diagnosis not present

## 2019-09-12 DIAGNOSIS — D649 Anemia, unspecified: Secondary | ICD-10-CM | POA: Diagnosis not present

## 2019-09-12 DIAGNOSIS — N183 Chronic kidney disease, stage 3 unspecified: Secondary | ICD-10-CM | POA: Diagnosis not present

## 2019-09-12 DIAGNOSIS — I10 Essential (primary) hypertension: Secondary | ICD-10-CM | POA: Diagnosis not present

## 2019-09-12 DIAGNOSIS — L97222 Non-pressure chronic ulcer of left calf with fat layer exposed: Secondary | ICD-10-CM | POA: Diagnosis not present

## 2019-09-12 MED ORDER — WARFARIN SODIUM 7.5 MG PO TABS: ORAL_TABLET | ORAL | 0 refills | Status: AC

## 2019-09-12 NOTE — Telephone Encounter (Signed)
Patient has not been seen in coumadin clinic since May. I called and spoke with Haley Cooper at her assisted living facility. States she had her INR check today at [facility]. But that she usually goes to the heart doctor for it. She has appointment with Katina Dung in Aug but not appointment with coumadin clinic. She is out of warfarin. I will send in 30 day supply and ask Edrick Oh to follow up on Monday about needing coumadin clinic appointment.  Will send under Dr. Domenic Polite the DOD in [place] today since pt previous cardiologist is no longer with practice.

## 2019-09-12 NOTE — Telephone Encounter (Signed)
.   Which medications need to be refilled? (please list name of each medication and dose if known)  Warfarin 7.5   2. Which pharmacy/location (including street and city if local pharmacy) is medication to be sent to?  [facility]   3. Do they need a 30 day or 90 day supply?

## 2019-09-15 DIAGNOSIS — I130 Hypertensive heart and chronic kidney disease with heart failure and stage 1 through stage 4 chronic kidney disease, or unspecified chronic kidney disease: Secondary | ICD-10-CM | POA: Diagnosis not present

## 2019-09-15 DIAGNOSIS — I872 Venous insufficiency (chronic) (peripheral): Secondary | ICD-10-CM | POA: Diagnosis not present

## 2019-09-15 DIAGNOSIS — E1151 Type 2 diabetes mellitus with diabetic peripheral angiopathy without gangrene: Secondary | ICD-10-CM | POA: Diagnosis not present

## 2019-09-15 DIAGNOSIS — L97802 Non-pressure chronic ulcer of other part of unspecified lower leg with fat layer exposed: Secondary | ICD-10-CM | POA: Diagnosis not present

## 2019-09-15 DIAGNOSIS — L97222 Non-pressure chronic ulcer of left calf with fat layer exposed: Secondary | ICD-10-CM | POA: Diagnosis not present

## 2019-09-15 DIAGNOSIS — Z4800 Encounter for change or removal of nonsurgical wound dressing: Secondary | ICD-10-CM | POA: Diagnosis not present

## 2019-09-17 DIAGNOSIS — L97222 Non-pressure chronic ulcer of left calf with fat layer exposed: Secondary | ICD-10-CM | POA: Diagnosis not present

## 2019-09-17 DIAGNOSIS — I130 Hypertensive heart and chronic kidney disease with heart failure and stage 1 through stage 4 chronic kidney disease, or unspecified chronic kidney disease: Secondary | ICD-10-CM | POA: Diagnosis not present

## 2019-09-17 DIAGNOSIS — I872 Venous insufficiency (chronic) (peripheral): Secondary | ICD-10-CM | POA: Diagnosis not present

## 2019-09-17 DIAGNOSIS — L97802 Non-pressure chronic ulcer of other part of unspecified lower leg with fat layer exposed: Secondary | ICD-10-CM | POA: Diagnosis not present

## 2019-09-17 DIAGNOSIS — Z4800 Encounter for change or removal of nonsurgical wound dressing: Secondary | ICD-10-CM | POA: Diagnosis not present

## 2019-09-17 DIAGNOSIS — E1151 Type 2 diabetes mellitus with diabetic peripheral angiopathy without gangrene: Secondary | ICD-10-CM | POA: Diagnosis not present

## 2019-09-19 DIAGNOSIS — I10 Essential (primary) hypertension: Secondary | ICD-10-CM | POA: Diagnosis not present

## 2019-09-19 DIAGNOSIS — E1151 Type 2 diabetes mellitus with diabetic peripheral angiopathy without gangrene: Secondary | ICD-10-CM | POA: Diagnosis not present

## 2019-09-19 DIAGNOSIS — Z9981 Dependence on supplemental oxygen: Secondary | ICD-10-CM | POA: Diagnosis not present

## 2019-09-19 DIAGNOSIS — E1122 Type 2 diabetes mellitus with diabetic chronic kidney disease: Secondary | ICD-10-CM | POA: Diagnosis not present

## 2019-09-19 DIAGNOSIS — I272 Pulmonary hypertension, unspecified: Secondary | ICD-10-CM | POA: Diagnosis not present

## 2019-09-19 DIAGNOSIS — E039 Hypothyroidism, unspecified: Secondary | ICD-10-CM | POA: Diagnosis not present

## 2019-09-19 DIAGNOSIS — I509 Heart failure, unspecified: Secondary | ICD-10-CM | POA: Diagnosis not present

## 2019-09-19 DIAGNOSIS — I4891 Unspecified atrial fibrillation: Secondary | ICD-10-CM | POA: Diagnosis not present

## 2019-09-19 DIAGNOSIS — E669 Obesity, unspecified: Secondary | ICD-10-CM | POA: Diagnosis not present

## 2019-09-19 DIAGNOSIS — I130 Hypertensive heart and chronic kidney disease with heart failure and stage 1 through stage 4 chronic kidney disease, or unspecified chronic kidney disease: Secondary | ICD-10-CM | POA: Diagnosis not present

## 2019-09-19 DIAGNOSIS — J449 Chronic obstructive pulmonary disease, unspecified: Secondary | ICD-10-CM | POA: Diagnosis not present

## 2019-09-19 DIAGNOSIS — Z4800 Encounter for change or removal of nonsurgical wound dressing: Secondary | ICD-10-CM | POA: Diagnosis not present

## 2019-09-19 DIAGNOSIS — N183 Chronic kidney disease, stage 3 unspecified: Secondary | ICD-10-CM | POA: Diagnosis not present

## 2019-09-19 DIAGNOSIS — Z794 Long term (current) use of insulin: Secondary | ICD-10-CM | POA: Diagnosis not present

## 2019-09-19 DIAGNOSIS — Z6841 Body Mass Index (BMI) 40.0 and over, adult: Secondary | ICD-10-CM | POA: Diagnosis not present

## 2019-09-19 DIAGNOSIS — I872 Venous insufficiency (chronic) (peripheral): Secondary | ICD-10-CM | POA: Diagnosis not present

## 2019-09-19 DIAGNOSIS — L97222 Non-pressure chronic ulcer of left calf with fat layer exposed: Secondary | ICD-10-CM | POA: Diagnosis not present

## 2019-09-19 DIAGNOSIS — Z7901 Long term (current) use of anticoagulants: Secondary | ICD-10-CM | POA: Diagnosis not present

## 2019-09-19 DIAGNOSIS — L97802 Non-pressure chronic ulcer of other part of unspecified lower leg with fat layer exposed: Secondary | ICD-10-CM | POA: Diagnosis not present

## 2019-09-19 DIAGNOSIS — M1990 Unspecified osteoarthritis, unspecified site: Secondary | ICD-10-CM | POA: Diagnosis not present

## 2019-09-19 DIAGNOSIS — D631 Anemia in chronic kidney disease: Secondary | ICD-10-CM | POA: Diagnosis not present

## 2019-09-19 DIAGNOSIS — Z87891 Personal history of nicotine dependence: Secondary | ICD-10-CM | POA: Diagnosis not present

## 2019-09-19 DIAGNOSIS — E7800 Pure hypercholesterolemia, unspecified: Secondary | ICD-10-CM | POA: Diagnosis not present

## 2019-09-22 DIAGNOSIS — I872 Venous insufficiency (chronic) (peripheral): Secondary | ICD-10-CM | POA: Diagnosis not present

## 2019-09-22 DIAGNOSIS — L97802 Non-pressure chronic ulcer of other part of unspecified lower leg with fat layer exposed: Secondary | ICD-10-CM | POA: Diagnosis not present

## 2019-09-22 DIAGNOSIS — E1151 Type 2 diabetes mellitus with diabetic peripheral angiopathy without gangrene: Secondary | ICD-10-CM | POA: Diagnosis not present

## 2019-09-22 DIAGNOSIS — N184 Chronic kidney disease, stage 4 (severe): Secondary | ICD-10-CM | POA: Diagnosis not present

## 2019-09-22 DIAGNOSIS — Z4800 Encounter for change or removal of nonsurgical wound dressing: Secondary | ICD-10-CM | POA: Diagnosis not present

## 2019-09-22 DIAGNOSIS — L97222 Non-pressure chronic ulcer of left calf with fat layer exposed: Secondary | ICD-10-CM | POA: Diagnosis not present

## 2019-09-22 DIAGNOSIS — I130 Hypertensive heart and chronic kidney disease with heart failure and stage 1 through stage 4 chronic kidney disease, or unspecified chronic kidney disease: Secondary | ICD-10-CM | POA: Diagnosis not present

## 2019-09-24 DIAGNOSIS — E1151 Type 2 diabetes mellitus with diabetic peripheral angiopathy without gangrene: Secondary | ICD-10-CM | POA: Diagnosis not present

## 2019-09-24 DIAGNOSIS — I130 Hypertensive heart and chronic kidney disease with heart failure and stage 1 through stage 4 chronic kidney disease, or unspecified chronic kidney disease: Secondary | ICD-10-CM | POA: Diagnosis not present

## 2019-09-24 DIAGNOSIS — L97222 Non-pressure chronic ulcer of left calf with fat layer exposed: Secondary | ICD-10-CM | POA: Diagnosis not present

## 2019-09-24 DIAGNOSIS — L97802 Non-pressure chronic ulcer of other part of unspecified lower leg with fat layer exposed: Secondary | ICD-10-CM | POA: Diagnosis not present

## 2019-09-24 DIAGNOSIS — I872 Venous insufficiency (chronic) (peripheral): Secondary | ICD-10-CM | POA: Diagnosis not present

## 2019-09-24 DIAGNOSIS — Z4800 Encounter for change or removal of nonsurgical wound dressing: Secondary | ICD-10-CM | POA: Diagnosis not present

## 2019-09-26 DIAGNOSIS — Z9981 Dependence on supplemental oxygen: Secondary | ICD-10-CM | POA: Diagnosis not present

## 2019-09-26 DIAGNOSIS — Z794 Long term (current) use of insulin: Secondary | ICD-10-CM | POA: Diagnosis not present

## 2019-09-26 DIAGNOSIS — I272 Pulmonary hypertension, unspecified: Secondary | ICD-10-CM | POA: Diagnosis present

## 2019-09-26 DIAGNOSIS — J90 Pleural effusion, not elsewhere classified: Secondary | ICD-10-CM | POA: Diagnosis not present

## 2019-09-26 DIAGNOSIS — Z87891 Personal history of nicotine dependence: Secondary | ICD-10-CM | POA: Diagnosis not present

## 2019-09-26 DIAGNOSIS — J9611 Chronic respiratory failure with hypoxia: Secondary | ICD-10-CM | POA: Diagnosis present

## 2019-09-26 DIAGNOSIS — R0602 Shortness of breath: Secondary | ICD-10-CM | POA: Diagnosis not present

## 2019-09-26 DIAGNOSIS — E1122 Type 2 diabetes mellitus with diabetic chronic kidney disease: Secondary | ICD-10-CM | POA: Diagnosis present

## 2019-09-26 DIAGNOSIS — N179 Acute kidney failure, unspecified: Secondary | ICD-10-CM | POA: Diagnosis present

## 2019-09-26 DIAGNOSIS — I10 Essential (primary) hypertension: Secondary | ICD-10-CM | POA: Diagnosis not present

## 2019-09-26 DIAGNOSIS — J449 Chronic obstructive pulmonary disease, unspecified: Secondary | ICD-10-CM | POA: Diagnosis not present

## 2019-09-26 DIAGNOSIS — I4819 Other persistent atrial fibrillation: Secondary | ICD-10-CM | POA: Diagnosis not present

## 2019-09-26 DIAGNOSIS — I739 Peripheral vascular disease, unspecified: Secondary | ICD-10-CM | POA: Diagnosis not present

## 2019-09-26 DIAGNOSIS — I4811 Longstanding persistent atrial fibrillation: Secondary | ICD-10-CM | POA: Diagnosis not present

## 2019-09-26 DIAGNOSIS — I4891 Unspecified atrial fibrillation: Secondary | ICD-10-CM | POA: Diagnosis not present

## 2019-09-26 DIAGNOSIS — I517 Cardiomegaly: Secondary | ICD-10-CM | POA: Diagnosis not present

## 2019-09-26 DIAGNOSIS — L97229 Non-pressure chronic ulcer of left calf with unspecified severity: Secondary | ICD-10-CM | POA: Diagnosis not present

## 2019-09-26 DIAGNOSIS — J811 Chronic pulmonary edema: Secondary | ICD-10-CM | POA: Diagnosis not present

## 2019-09-26 DIAGNOSIS — N184 Chronic kidney disease, stage 4 (severe): Secondary | ICD-10-CM | POA: Diagnosis present

## 2019-09-26 DIAGNOSIS — E039 Hypothyroidism, unspecified: Secondary | ICD-10-CM | POA: Diagnosis present

## 2019-09-26 DIAGNOSIS — F419 Anxiety disorder, unspecified: Secondary | ICD-10-CM | POA: Diagnosis present

## 2019-09-26 DIAGNOSIS — E7800 Pure hypercholesterolemia, unspecified: Secondary | ICD-10-CM | POA: Diagnosis present

## 2019-09-26 DIAGNOSIS — D631 Anemia in chronic kidney disease: Secondary | ICD-10-CM | POA: Diagnosis present

## 2019-09-26 DIAGNOSIS — I83022 Varicose veins of left lower extremity with ulcer of calf: Secondary | ICD-10-CM | POA: Diagnosis not present

## 2019-09-26 DIAGNOSIS — I130 Hypertensive heart and chronic kidney disease with heart failure and stage 1 through stage 4 chronic kidney disease, or unspecified chronic kidney disease: Secondary | ICD-10-CM | POA: Diagnosis not present

## 2019-09-26 DIAGNOSIS — I509 Heart failure, unspecified: Secondary | ICD-10-CM | POA: Diagnosis not present

## 2019-09-26 DIAGNOSIS — I5033 Acute on chronic diastolic (congestive) heart failure: Secondary | ICD-10-CM | POA: Diagnosis not present

## 2019-09-26 DIAGNOSIS — Z7901 Long term (current) use of anticoagulants: Secondary | ICD-10-CM | POA: Diagnosis not present

## 2019-09-26 DIAGNOSIS — E1151 Type 2 diabetes mellitus with diabetic peripheral angiopathy without gangrene: Secondary | ICD-10-CM | POA: Diagnosis present

## 2019-09-26 DIAGNOSIS — I5032 Chronic diastolic (congestive) heart failure: Secondary | ICD-10-CM | POA: Diagnosis not present

## 2019-09-26 DIAGNOSIS — U071 COVID-19: Secondary | ICD-10-CM | POA: Diagnosis not present

## 2019-09-30 ENCOUNTER — Encounter: Payer: Medicare Other | Admitting: Vascular Surgery

## 2019-10-03 ENCOUNTER — Telehealth: Payer: Self-pay | Admitting: Cardiology

## 2019-10-03 NOTE — Telephone Encounter (Signed)
Per phone call from Tia Alert, RN she works for [employer] and pt lives in a family care home  Haley Cooper evaluated the patient today since being discharged from [facility] and the patient is currently in [facility]-   Please give Haley Cooper [phone]

## 2019-10-03 NOTE — Telephone Encounter (Signed)
Needs to touch base with pcp who managed her for heart failure just last week in the hospital, we have not evaluated her in 6 months or since her admisson, can get earliest available appt with PA with [place]. If severe issues needs to come to the ER   Haley Abts MD

## 2019-10-03 NOTE — Telephone Encounter (Signed)
Attempt to reach RN, voicemail full,unable to leave message.

## 2019-10-03 NOTE — Telephone Encounter (Signed)
HHN states patient has 3+ pedal edema and crackles bi-basilar. Weight is 204.4 lbs reords are being faxed from [facility] to [place] office, [place] [place] aware.    Patient refuses to go the ED for evaluation

## 2019-10-06 ENCOUNTER — Ambulatory Visit: Payer: Medicare Other | Admitting: Family Medicine

## 2019-10-06 ENCOUNTER — Ambulatory Visit: Payer: Medicare Other | Admitting: Cardiology

## 2019-10-06 NOTE — Progress Notes (Deleted)
Clinical Summary Haley Cooper is a 79 y.o.female  1. Chronic diastolic HF - Echocardiogram 08/22/15 demonstrated normal left ventricular systolic function, LVEF 71-24%, moderate LVH, grade 2 diastolicdysfunction with elevated left ventricular filling pressures, severe left atrial and mild right ventricular and mild right atrial dilatation, moderate tricuspid regurgitation with moderate to severely elevated pulmonary pressures, 61 mmHg.  - admit 09/2019 to [facility] with acute on chronic diastolic HF - was COVID 58+ during admission, she is vaccinated. F/u testing was negative.   2. PAF    3. HTN  4. Chronic hypoxic resp failure - on 2L [place]  Past Medical History:  Diagnosis Date  . Anemia   . Arthritis    HANDS  . CHF (congestive heart failure) ([place])   . Chronic kidney disease 04/2013   . COPD (chronic obstructive pulmonary disease) ([place])   . DM2 (diabetes mellitus, type 2) ([place])   . HTN (hypertension)   . Paroxysmal atrial fibrillation (HCC)   . Shortness of breath      No Known Allergies   Current Outpatient Medications  Medication Sig Dispense Refill  . acetaminophen (TYLENOL) 500 MG tablet Take 500 mg by mouth as needed.    Marland Kitchen atorvastatin (LIPITOR) 20 MG tablet Take 20 mg by mouth at bedtime.     . carvedilol (COREG) 3.125 MG tablet Take 3.125 mg by mouth 2 (two) times daily.    . furosemide (LASIX) 40 MG tablet Take 1 tablet (40 mg total) by mouth 2 (two) times daily.    Marland Kitchen gabapentin (NEURONTIN) 300 MG capsule Take 1 capsule (300 mg total) by mouth 3 (three) times daily.    . hydrALAZINE (APRESOLINE) 50 MG tablet TAKE 1 1/2 TABLETS(75MG ) BY MOUTH 3 TIMES A DAY. 405 tablet 1  . insulin glargine (LANTUS) 100 UNIT/ML injection Inject 0.12 mLs (12 Units total) into the skin every morning. (Patient taking differently: Inject 12 Units into the skin daily. ) 10 mL 11  . insulin lispro (HUMALOG KWIKPEN) 100 UNIT/ML KiwkPen Inject 6 Units into the skin daily before supper.      . iron polysaccharides (NIFEREX) 150 MG capsule Take 150 mg by mouth daily.    Marland Kitchen levothyroxine (SYNTHROID) 125 MCG tablet Take 125 mcg by mouth daily.    Marland Kitchen lisinopril (PRINIVIL,ZESTRIL) 20 MG tablet Take 1 tablet (20 mg total) by mouth daily. (Patient taking differently: Take 20 mg by mouth 2 (two) times daily. )    . OXYGEN Inhale into the lungs at bedtime. 2 L/min    . warfarin (COUMADIN) 7.5 MG tablet Take 1 tablet daily except 1/2 tablet on Mondays, Wednesdays and Fridays or as directed 23 tablet 0   No current facility-administered medications for this visit.     Past Surgical History:  Procedure Laterality Date  . VEIN SURGERY       No Known Allergies    Family History  Problem Relation Age of Onset  . Hypertension Mother   . Hypertension Father      Social History [name] reports that she quit smoking about 35 years ago. Her smoking use included cigarettes. She started smoking about 64 years ago. She has a 29.00 pack-year smoking history. She has never used smokeless tobacco. [name] reports no history of alcohol use.   Review of Systems CONSTITUTIONAL: No weight loss, fever, chills, weakness or fatigue.  HEENT: Eyes: No visual loss, blurred vision, double vision or yellow sclerae.No hearing loss, sneezing, congestion, runny nose or sore  throat.  SKIN: No rash or itching.  CARDIOVASCULAR:  RESPIRATORY: No shortness of breath, cough or sputum.  GASTROINTESTINAL: No anorexia, nausea, vomiting or diarrhea. No abdominal pain or blood.  GENITOURINARY: No burning on urination, no polyuria NEUROLOGICAL: No headache, dizziness, syncope, paralysis, ataxia, numbness or tingling in the extremities. No change in bowel or bladder control.  MUSCULOSKELETAL: No muscle, back pain, joint pain or stiffness.  LYMPHATICS: No enlarged nodes. No history of splenectomy.  PSYCHIATRIC: No history of depression or anxiety.  ENDOCRINOLOGIC: No reports of sweating, cold or heat  intolerance. No polyuria or polydipsia.  Marland Kitchen   Physical Examination There were no vitals filed for this visit. There were no vitals filed for this visit.  Gen: resting comfortably, no acute distress HEENT: no scleral icterus, pupils equal round and reactive, no palptable cervical adenopathy,  CV Resp: Clear to auscultation bilaterally GI: abdomen is soft, non-tender, non-distended, normal bowel sounds, no hepatosplenomegaly MSK: extremities are warm, no edema.  Skin: warm, no rash Neuro:  no focal deficits Psych: appropriate affect   Diagnostic Studies     Assessment and Plan        Arnoldo Lenis, M.D., F.A.C.C.

## 2019-10-06 NOTE — Telephone Encounter (Signed)
I spoke with [facility] and patient has apt today with Dr.Branch in the [place] office.

## 2019-10-08 DIAGNOSIS — I4891 Unspecified atrial fibrillation: Secondary | ICD-10-CM | POA: Diagnosis not present

## 2019-10-08 DIAGNOSIS — I517 Cardiomegaly: Secondary | ICD-10-CM | POA: Diagnosis not present

## 2019-10-08 DIAGNOSIS — E860 Dehydration: Secondary | ICD-10-CM | POA: Diagnosis not present

## 2019-10-08 DIAGNOSIS — I5032 Chronic diastolic (congestive) heart failure: Secondary | ICD-10-CM | POA: Diagnosis not present

## 2019-10-08 DIAGNOSIS — I471 Supraventricular tachycardia: Secondary | ICD-10-CM | POA: Diagnosis not present

## 2019-10-08 DIAGNOSIS — I509 Heart failure, unspecified: Secondary | ICD-10-CM | POA: Diagnosis not present

## 2019-10-08 DIAGNOSIS — L97921 Non-pressure chronic ulcer of unspecified part of left lower leg limited to breakdown of skin: Secondary | ICD-10-CM | POA: Diagnosis not present

## 2019-10-08 DIAGNOSIS — N170 Acute kidney failure with tubular necrosis: Secondary | ICD-10-CM | POA: Diagnosis not present

## 2019-10-08 DIAGNOSIS — R21 Rash and other nonspecific skin eruption: Secondary | ICD-10-CM | POA: Diagnosis not present

## 2019-10-08 DIAGNOSIS — I361 Nonrheumatic tricuspid (valve) insufficiency: Secondary | ICD-10-CM | POA: Diagnosis not present

## 2019-10-08 DIAGNOSIS — I5033 Acute on chronic diastolic (congestive) heart failure: Secondary | ICD-10-CM | POA: Diagnosis not present

## 2019-10-08 DIAGNOSIS — Z6841 Body Mass Index (BMI) 40.0 and over, adult: Secondary | ICD-10-CM | POA: Diagnosis not present

## 2019-10-08 DIAGNOSIS — R05 Cough: Secondary | ICD-10-CM | POA: Diagnosis not present

## 2019-10-08 DIAGNOSIS — J9601 Acute respiratory failure with hypoxia: Secondary | ICD-10-CM | POA: Diagnosis not present

## 2019-10-08 DIAGNOSIS — I959 Hypotension, unspecified: Secondary | ICD-10-CM | POA: Diagnosis not present

## 2019-10-08 DIAGNOSIS — J90 Pleural effusion, not elsewhere classified: Secondary | ICD-10-CM | POA: Diagnosis not present

## 2019-10-08 DIAGNOSIS — N189 Chronic kidney disease, unspecified: Secondary | ICD-10-CM | POA: Diagnosis not present

## 2019-10-09 ENCOUNTER — Telehealth: Payer: Medicare Other | Admitting: Cardiovascular Disease

## 2019-10-09 DIAGNOSIS — J449 Chronic obstructive pulmonary disease, unspecified: Secondary | ICD-10-CM | POA: Diagnosis present

## 2019-10-09 DIAGNOSIS — I5032 Chronic diastolic (congestive) heart failure: Secondary | ICD-10-CM | POA: Diagnosis not present

## 2019-10-09 DIAGNOSIS — E7800 Pure hypercholesterolemia, unspecified: Secondary | ICD-10-CM | POA: Diagnosis present

## 2019-10-09 DIAGNOSIS — I471 Supraventricular tachycardia: Secondary | ICD-10-CM | POA: Diagnosis present

## 2019-10-09 DIAGNOSIS — E039 Hypothyroidism, unspecified: Secondary | ICD-10-CM | POA: Diagnosis present

## 2019-10-09 DIAGNOSIS — Z20822 Contact with and (suspected) exposure to covid-19: Secondary | ICD-10-CM | POA: Diagnosis present

## 2019-10-09 DIAGNOSIS — I272 Pulmonary hypertension, unspecified: Secondary | ICD-10-CM | POA: Diagnosis present

## 2019-10-09 DIAGNOSIS — N170 Acute kidney failure with tubular necrosis: Secondary | ICD-10-CM | POA: Diagnosis present

## 2019-10-09 DIAGNOSIS — E1151 Type 2 diabetes mellitus with diabetic peripheral angiopathy without gangrene: Secondary | ICD-10-CM | POA: Diagnosis present

## 2019-10-09 DIAGNOSIS — N179 Acute kidney failure, unspecified: Secondary | ICD-10-CM | POA: Diagnosis not present

## 2019-10-09 DIAGNOSIS — E860 Dehydration: Secondary | ICD-10-CM | POA: Diagnosis present

## 2019-10-09 DIAGNOSIS — Z6841 Body Mass Index (BMI) 40.0 and over, adult: Secondary | ICD-10-CM | POA: Diagnosis not present

## 2019-10-09 DIAGNOSIS — J9601 Acute respiratory failure with hypoxia: Secondary | ICD-10-CM | POA: Diagnosis present

## 2019-10-09 DIAGNOSIS — I959 Hypotension, unspecified: Secondary | ICD-10-CM | POA: Diagnosis present

## 2019-10-09 DIAGNOSIS — J811 Chronic pulmonary edema: Secondary | ICD-10-CM | POA: Diagnosis not present

## 2019-10-09 DIAGNOSIS — E1122 Type 2 diabetes mellitus with diabetic chronic kidney disease: Secondary | ICD-10-CM | POA: Diagnosis present

## 2019-10-09 DIAGNOSIS — I4819 Other persistent atrial fibrillation: Secondary | ICD-10-CM | POA: Diagnosis present

## 2019-10-09 DIAGNOSIS — I480 Paroxysmal atrial fibrillation: Secondary | ICD-10-CM | POA: Diagnosis not present

## 2019-10-09 DIAGNOSIS — I4891 Unspecified atrial fibrillation: Secondary | ICD-10-CM | POA: Diagnosis not present

## 2019-10-09 DIAGNOSIS — I509 Heart failure, unspecified: Secondary | ICD-10-CM | POA: Diagnosis not present

## 2019-10-09 DIAGNOSIS — I517 Cardiomegaly: Secondary | ICD-10-CM | POA: Diagnosis not present

## 2019-10-09 DIAGNOSIS — Z794 Long term (current) use of insulin: Secondary | ICD-10-CM | POA: Diagnosis not present

## 2019-10-09 DIAGNOSIS — N189 Chronic kidney disease, unspecified: Secondary | ICD-10-CM | POA: Diagnosis present

## 2019-10-09 DIAGNOSIS — Z515 Encounter for palliative care: Secondary | ICD-10-CM | POA: Diagnosis present

## 2019-10-09 DIAGNOSIS — N390 Urinary tract infection, site not specified: Secondary | ICD-10-CM | POA: Diagnosis present

## 2019-10-09 DIAGNOSIS — I361 Nonrheumatic tricuspid (valve) insufficiency: Secondary | ICD-10-CM | POA: Diagnosis not present

## 2019-10-09 DIAGNOSIS — I739 Peripheral vascular disease, unspecified: Secondary | ICD-10-CM | POA: Diagnosis not present

## 2019-10-09 DIAGNOSIS — Z79890 Hormone replacement therapy (postmenopausal): Secondary | ICD-10-CM | POA: Diagnosis not present

## 2019-10-09 DIAGNOSIS — N289 Disorder of kidney and ureter, unspecified: Secondary | ICD-10-CM | POA: Diagnosis not present

## 2019-10-09 DIAGNOSIS — I5033 Acute on chronic diastolic (congestive) heart failure: Secondary | ICD-10-CM | POA: Diagnosis present

## 2019-10-09 DIAGNOSIS — L97921 Non-pressure chronic ulcer of unspecified part of left lower leg limited to breakdown of skin: Secondary | ICD-10-CM | POA: Diagnosis present

## 2019-10-09 DIAGNOSIS — F419 Anxiety disorder, unspecified: Secondary | ICD-10-CM | POA: Diagnosis present

## 2019-10-09 DIAGNOSIS — Z66 Do not resuscitate: Secondary | ICD-10-CM | POA: Diagnosis present

## 2019-10-09 DIAGNOSIS — I130 Hypertensive heart and chronic kidney disease with heart failure and stage 1 through stage 4 chronic kidney disease, or unspecified chronic kidney disease: Secondary | ICD-10-CM | POA: Diagnosis present

## 2019-10-20 DIAGNOSIS — I10 Essential (primary) hypertension: Secondary | ICD-10-CM | POA: Diagnosis not present

## 2019-10-22 DEATH — deceased

## 2019-11-07 ENCOUNTER — Encounter: Payer: Medicare Other | Admitting: Vascular Surgery

## 2019-11-21 ENCOUNTER — Ambulatory Visit: Payer: Medicare Other | Admitting: Family Medicine

## 2019-11-21 DEATH — deceased
